# Patient Record
Sex: Male | Born: 1950 | Race: White | Hispanic: No | Marital: Married | Smoking: Former smoker
Health system: Southern US, Community
[De-identification: ages and names within clinical notes are randomized; demographics above are authoritative.]

## PROBLEM LIST (undated history)

## (undated) DIAGNOSIS — T7840XA Allergy, unspecified, initial encounter: Secondary | ICD-10-CM

## (undated) DIAGNOSIS — I10 Essential (primary) hypertension: Secondary | ICD-10-CM

## (undated) DIAGNOSIS — M1990 Unspecified osteoarthritis, unspecified site: Secondary | ICD-10-CM

## (undated) DIAGNOSIS — K219 Gastro-esophageal reflux disease without esophagitis: Secondary | ICD-10-CM

## (undated) DIAGNOSIS — Z951 Presence of aortocoronary bypass graft: Secondary | ICD-10-CM

## (undated) DIAGNOSIS — Z9889 Other specified postprocedural states: Secondary | ICD-10-CM

## (undated) DIAGNOSIS — E785 Hyperlipidemia, unspecified: Secondary | ICD-10-CM

## (undated) DIAGNOSIS — M1612 Unilateral primary osteoarthritis, left hip: Secondary | ICD-10-CM

## (undated) DIAGNOSIS — I2510 Atherosclerotic heart disease of native coronary artery without angina pectoris: Secondary | ICD-10-CM

## (undated) DIAGNOSIS — E039 Hypothyroidism, unspecified: Secondary | ICD-10-CM

## (undated) HISTORY — PX: INGUINAL HERNIA REPAIR: SUR1180

## (undated) HISTORY — PX: BACK SURGERY: SHX140

## (undated) HISTORY — DX: Allergy, unspecified, initial encounter: T78.40XA

## (undated) HISTORY — PX: CARPAL TUNNEL RELEASE: SHX101

## (undated) HISTORY — PX: JOINT REPLACEMENT: SHX530

## (undated) HISTORY — PX: CORONARY ANGIOPLASTY: SHX604

## (undated) HISTORY — DX: Presence of aortocoronary bypass graft: Z95.1

## (undated) HISTORY — PX: LUMBAR DISC SURGERY: SHX700

## (undated) HISTORY — PX: CATARACT EXTRACTION: SUR2

## (undated) HISTORY — PX: TONSILLECTOMY: SUR1361

## (undated) HISTORY — PX: URETEROLITHOTOMY: SHX71

---
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
## 1998-10-22 ENCOUNTER — Inpatient Hospital Stay (HOSPITAL_COMMUNITY): Admission: AD | Admit: 1998-10-22 | Discharge: 1998-10-24 | Payer: Self-pay | Admitting: Cardiology

## 2008-01-23 HISTORY — PX: CORONARY ARTERY BYPASS GRAFT: SHX141

## 2008-01-24 ENCOUNTER — Encounter: Payer: Self-pay | Admitting: Cardiology

## 2008-01-24 ENCOUNTER — Encounter: Payer: Self-pay | Admitting: Thoracic Surgery (Cardiothoracic Vascular Surgery)

## 2008-01-24 ENCOUNTER — Ambulatory Visit: Payer: Self-pay | Admitting: Cardiology

## 2008-01-24 ENCOUNTER — Ambulatory Visit: Payer: Self-pay | Admitting: Thoracic Surgery (Cardiothoracic Vascular Surgery)

## 2008-01-24 ENCOUNTER — Inpatient Hospital Stay (HOSPITAL_COMMUNITY): Admission: AD | Admit: 2008-01-24 | Discharge: 2008-01-31 | Payer: Self-pay | Admitting: Internal Medicine

## 2008-01-25 ENCOUNTER — Encounter: Payer: Self-pay | Admitting: Thoracic Surgery (Cardiothoracic Vascular Surgery)

## 2008-01-27 ENCOUNTER — Encounter: Payer: Self-pay | Admitting: Cardiology

## 2008-01-30 ENCOUNTER — Encounter: Payer: Self-pay | Admitting: Cardiology

## 2008-02-22 ENCOUNTER — Ambulatory Visit: Payer: Self-pay | Admitting: Cardiology

## 2008-02-28 ENCOUNTER — Ambulatory Visit: Payer: Self-pay | Admitting: Thoracic Surgery (Cardiothoracic Vascular Surgery)

## 2008-02-28 ENCOUNTER — Encounter
Admission: RE | Admit: 2008-02-28 | Discharge: 2008-02-28 | Payer: Self-pay | Admitting: Thoracic Surgery (Cardiothoracic Vascular Surgery)

## 2008-04-25 ENCOUNTER — Ambulatory Visit: Payer: Self-pay | Admitting: Cardiology

## 2008-12-01 ENCOUNTER — Ambulatory Visit: Payer: Self-pay | Admitting: Cardiology

## 2009-08-02 DIAGNOSIS — E1169 Type 2 diabetes mellitus with other specified complication: Secondary | ICD-10-CM | POA: Insufficient documentation

## 2009-08-02 DIAGNOSIS — E119 Type 2 diabetes mellitus without complications: Secondary | ICD-10-CM

## 2009-08-02 DIAGNOSIS — E1159 Type 2 diabetes mellitus with other circulatory complications: Secondary | ICD-10-CM | POA: Insufficient documentation

## 2009-08-02 DIAGNOSIS — E785 Hyperlipidemia, unspecified: Secondary | ICD-10-CM

## 2009-08-02 DIAGNOSIS — E782 Mixed hyperlipidemia: Secondary | ICD-10-CM | POA: Insufficient documentation

## 2009-08-02 DIAGNOSIS — I152 Hypertension secondary to endocrine disorders: Secondary | ICD-10-CM | POA: Insufficient documentation

## 2009-08-02 DIAGNOSIS — E039 Hypothyroidism, unspecified: Secondary | ICD-10-CM | POA: Insufficient documentation

## 2009-08-02 DIAGNOSIS — I10 Essential (primary) hypertension: Secondary | ICD-10-CM | POA: Insufficient documentation

## 2009-11-30 ENCOUNTER — Telehealth (INDEPENDENT_AMBULATORY_CARE_PROVIDER_SITE_OTHER): Payer: Self-pay | Admitting: *Deleted

## 2010-01-17 ENCOUNTER — Encounter: Payer: Self-pay | Admitting: Cardiology

## 2010-01-17 DIAGNOSIS — Z951 Presence of aortocoronary bypass graft: Secondary | ICD-10-CM | POA: Insufficient documentation

## 2010-01-17 HISTORY — DX: Presence of aortocoronary bypass graft: Z95.1

## 2010-01-18 ENCOUNTER — Ambulatory Visit: Payer: Self-pay | Admitting: Cardiology

## 2011-01-12 ENCOUNTER — Encounter: Payer: Self-pay | Admitting: Thoracic Surgery (Cardiothoracic Vascular Surgery)

## 2011-01-23 NOTE — Assessment & Plan Note (Signed)
Summary: 1 YR FU -SRS   Visit Type:  Follow-up Referring Provider:  Bennie Dallas, MD  [facility] Primary Provider:  Dr. Wyvonnia Lora   CC:  CAD.  History of Present Illness: the patient is seen for followup of coronary artery disease.  I saw him last December, 2009.  He is doing very well.  He underwent CABG February, 2009.  His symptom at that time was exertional shortness of breath.  This was immediately improved after surgery.  He's had no return of symptoms.  He follows carefully with Dr.Tapper or primary care and Dr.Cantley for diabetes.  I've asked the patient to check to be sure who is overseeing his actual cholesterol levels.  Preventive Screening-Counseling & Management  Alcohol-Tobacco     Smoking Status: quit > 6 months  Comments: Smoked for about 10 years, quit about 30 yrs ago  Current Medications (verified): 1)  Metoprolol Tartrate 25 Mg Tabs (Metoprolol Tartrate) .... Take 1 Tablet By Mouth Twice A Day 2)  Levothyroxine Sodium 200 Mcg Tabs (Levothyroxine Sodium) .... Take 1 Tablet By Mouth Once A Day 3)  Simvastatin 80 Mg Tabs (Simvastatin) .... Take One Tablet By Mouth Daily At Bedtime 4)  Lisinopril 10 Mg Tabs (Lisinopril) .... Take One Tablet By Mouth Daily 5)  Aspirin 81 Mg Tbec (Aspirin) .... Take One Tablet By Mouth Daily 6)  Novolog 100 Unit/ml Soln (Insulin Aspart) .... Sliding Scale As Directed 7)  Lantus 100 Unit/ml Soln (Insulin Glargine) .... Inject 15 Units Subcutaneously Once Daily  Allergies: No Known Drug Allergies  Comments:  Nurse/Medical Assistant: The patient's medications were reviewed with the patient and were updated in the Medication List. Pt brought a list of medications to office visit.  Cyril Loosen, RN, BSN (January 18, 2010 10:32 AM)  Past History:  Past Medical History: Last updated: 01/17/2010 HYPERLIPIDEMIA-MIXED (ICD-272.4) CAD, NATIVE VESSEL (ICD-414.01) CABG... February, 2009.Marland KitchenMarland KitchenLIMA LAD, right radial to posterior  descending, SVG to first diagonal, SVG sequential OM1, OM 2, ON 3 HYPOTHYROIDISM (ICD-244.9) DIABETES MELLITUS (ICD-250.00)...Dr.Cantley.[facility] HYPERTENSION, UNSPECIFIED (ICD-401.9) LV function normal    Social History: Smoking Status:  quit > 6 months  Review of Systems       Patient denies fever, chills, headache, sweats, rash, change in vision, change in hearing, chest pain, cough, shortness of breath, nausea vomiting, urinary symptoms, musculoskeletal pain.  All of the systems are reviewed and are negative.  Vital Signs:  Patient profile:   60 year old male Height:      76 inches Weight:      265 pounds BMI:     32.37 Pulse rate:   60 / minute BP sitting:   120 / 81  (left arm) Cuff size:   large  Vitals Entered By: Cyril Loosen, RN, BSN (January 18, 2010 10:28 AM)  Nutrition Counseling: Patient's BMI is greater than 25 and therefore counseled on weight management options. CC: CAD Comments Follow up. No cardiac complaints. Pt states he saw endocrinologist in December and PA told him she heard an irregular heartbeat. Pt denies palpitations, heart fluttering, etc. He states when he saw endocrinologist he was having trouble with bad indigestion.   Physical Exam  General:  Patient looks quite good today. Head:  head is atraumatic. Eyes:  no xanthelasma. Neck:  no jugular venous distention. Chest Wall:  no chest wall tenderness. Lungs:  lungs are clear.  Respiratory effort is nonlabored. Heart:  cardiac exam reveals S1-S2.  No clicks or significant murmurs. Abdomen:  abdomen  is soft. Msk:  no musculoskeletal deformities. Extremities:  no peripheral edema. Skin:  no skin rashes. Psych:  patient is oriented to person time and place.  Affect is normal.   Impression & Recommendations:  Problem # 1:  HYPOTHYROIDISM (ICD-244.9)  His updated medication list for this problem includes:    Levothyroxine Sodium 200 Mcg Tabs (Levothyroxine sodium) .Marland Kitchen... Take 1  tablet by mouth once a day The patient is followed by [facility] for his thyroid disease.  No change in therapy.  Problem # 2:  HYPERLIPIDEMIA-MIXED (ICD-272.4)  His updated medication list for this problem includes:    Simvastatin 80 Mg Tabs (Simvastatin) .Marland Kitchen... Take one tablet by mouth daily at bedtime  I've asked the patient to ask his other physicians who is overseeing his cholesterol.  He is on simvastatin 80 mg.  Hopefully he has an LDL in the range of 70. with the FDA recommendation, simvastatin should probably be reduced to 40 mg.  I am hesitant to do this without knowing his labs.  I would recommend lowering his simvastatin to 40 and then considering other medicines if he is not at goal.  Problem # 3:  CAD, NATIVE VESSEL (ICD-414.01)  His updated medication list for this problem includes:    Metoprolol Tartrate 25 Mg Tabs (Metoprolol tartrate) .Marland Kitchen... Take 1 tablet by mouth twice a day    Lisinopril 10 Mg Tabs (Lisinopril) .Marland Kitchen... Take one tablet by mouth daily    Aspirin 81 Mg Tbec (Aspirin) .Marland Kitchen... Take one tablet by mouth daily  Orders: EKG w/ Interpretation (93000)  The patient is having no symptoms.  He is fully active.  EKG is done today and reviewed by me.  There is normal sinus rhythm and EKG is normal.  Problem # 4:  HYPERTENSION, UNSPECIFIED (ICD-401.9)  His updated medication list for this problem includes:    Metoprolol Tartrate 25 Mg Tabs (Metoprolol tartrate) .Marland Kitchen... Take 1 tablet by mouth twice a day    Lisinopril 10 Mg Tabs (Lisinopril) .Marland Kitchen... Take one tablet by mouth daily    Aspirin 81 Mg Tbec (Aspirin) .Marland Kitchen... Take one tablet by mouth daily  Blood pressure is under excellent control.  No change in therapy.  Patient Instructions: 1)  Your physician recommends that you continue on your current medications as directed. Please refer to the Current Medication list given to you today. 2)  Your physician wants you to follow-up in: 1YEAR. You will receive a reminder letter in  the mail about two months in advance. If you don't receive a letter, please call our office to schedule the follow-up appointment.

## 2011-01-23 NOTE — Miscellaneous (Signed)
  Clinical Lists Changes  Problems: Added new problem of CORONARY ARTERY BYPASS GRAFT, HX OF (ICD-V45.81) Added new problem of HYPOTHYROIDISM (ICD-244.9) Observations: Added new observation of PAST MED HX: HYPERLIPIDEMIA-MIXED (ICD-272.4) CAD, NATIVE VESSEL (ICD-414.01) CABG... February, 2009.Marland KitchenMarland KitchenLIMA LAD, right radial to posterior descending, SVG to first diagonal, SVG sequential OM1, OM 2, ON 3 HYPOTHYROIDISM (ICD-244.9) DIABETES MELLITUS (ICD-250.00)...Dr.Cantley.[facility] HYPERTENSION, UNSPECIFIED (ICD-401.9) LV function normal   (01/17/2010 16:55)       Past History:  Past Medical History: HYPERLIPIDEMIA-MIXED (ICD-272.4) CAD, NATIVE VESSEL (ICD-414.01) CABG... February, 2009.Marland KitchenMarland KitchenLIMA LAD, right radial to posterior descending, SVG to first diagonal, SVG sequential OM1, OM 2, ON 3 HYPOTHYROIDISM (ICD-244.9) DIABETES MELLITUS (ICD-250.00)...Dr.Cantley.[facility] HYPERTENSION, UNSPECIFIED (ICD-401.9) LV function normal

## 2011-02-24 ENCOUNTER — Ambulatory Visit: Payer: Self-pay | Admitting: Cardiology

## 2011-05-06 NOTE — Assessment & Plan Note (Signed)
[facility]                          [facility] OFFICE NOTE   NAME:Jeffrey Shaw                        MRN:          [id number]  DATE:02/22/2008                            DOB:          [birth year]    Jeffrey Shaw is doing very well.  He is now seen for his first visit after  his bypass surgery.  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  He developed symptoms and I saw him in consultation in the  emergency room at [facility] on January 24, 2008.  He had chest  discomfort with some shortness of breath while walking and then on the  day of admission, he had more and it was prolonged.  He was stabilized  and came to [place], and he was stable and we immediately sent him to  [facility].  At [facility] catheterization showed significant disease and he  underwent CABG by Dr. Andrey Spearman.  He did have a carotid duplex  showing no significant stenoses.  He received excellent operation.  He  had CABG times 6 with a LIMA to the LAD, right radial artery to the  posterior descending, SVG to the first diagonal, sequential SVG to the  OM1 2, and 3.  He had endoscopic harvest of the vein of his right leg.   Since being discharged, he is stable.  He is already walking in his gym.  He is active and doing very well.  He has had no chest pain.  He is  quite motivated.   PAST MEDICAL HISTORY:   ALLERGIES:  No known drug allergies.   MEDICATIONS:  Oxycodone as needed.  Isosorbide (to run out in 1 month),  Lantus 15 units in the evening, NovoLog sliding scale with meals,  aspirin 81, metoprolol 25 thyroid 0.2 mg daily, and next simvastatin 80  mg daily.   OTHER MEDICAL PROBLEMS:  See the list below.   REVIEW OF SYSTEMS:  He is feeling well and he is nicely recovering.  Review of systems otherwise is negative.   PHYSICAL EXAM:  Blood pressure is 105/64.  Weight is 245 pounds.  The patient is oriented to person, time and place.  Affect is  normal.  HEENT:  Reveals no xanthelasma.  He has normal extraocular motion.  There are no carotid bruits.  There is no jugular venous distention.  Lungs are clear.  Respiratory effort is not labored.  Cardiac exam reveals S1-S2.  There are no clicks or significant murmurs.  His chest lesions are nicely healed.  Abdomen is soft.  He has normal bowel sounds.  No significant peripheral edema.   EKG reveals sinus rhythm.   PROBLEMS:  1. Diabetes treated.  2. History of hypertension treated.  3. Hypothyroidism, treated.  4. Coronary artery disease status post CABG as outlined on January 27, 2008 with CABG times 6 using a LIMA to the LAD and a right radial      artery to the posterior descending and other vein grafts.  5. Dyslipidemia, treated.  6.  Normal LV function with ejection fraction estimated 60% in the cath      lab.   Jeffrey Shaw is doing well.  I will let his Imdur run out.  I will see him  back in 2 months.  He will see Dr. Dorris Fetch next week as his visit  this week had to be cancelled because of snow.     Jeffrey Abed, MD, [facility]  Electronically Signed    JDK/MedQ  DD: 02/22/2008  DT: 02/22/2008  Job #: [id number]   cc:   Wyvonnia Lora  Dr. Bennie Dallas

## 2011-05-06 NOTE — Assessment & Plan Note (Signed)
OFFICE VISIT   GEREMIAH, FUSSELL  DOB:  [birth year]                                        February 28, 2008  CHART #:  [id number]   The patient is a 60 year old gentleman who presented with new onset  angina.  He underwent coronary artery bypass grafting x6 on February 5.  We did left mammary and right radial as well as saphenous veins.  He is  diabetic.  Postoperatively, he really did quite well and was discharged  home on postoperative day #4 without any significant complications.   The patient returns today for a scheduled follow-up visit.  His chief  complaint is that he has numbness in the fourth and fifth fingers on  both hands which has been aggravating to him.  He still has some  discomfort in the sternal incision.  He is only taking his pain  medication at night because he has difficulty sleeping due to the  discomfort.  He is not having to take pain medication during the day.  He works as a Systems analyst and he tried to go back to work for a  full day and he was exhausted for about two days afterward.  However, he  is walking at least two miles everyday.   PHYSICAL EXAMINATION:  GENERAL:  The patient is a 60 year old white male  in no acute distress.  VITAL SIGNS:  Blood pressure 148/82, pulse 64, respirations 18, oxygen  saturations 95% on room air.  NEUROLOGY:  He is alert and oriented x3, appropriate, and grossly  intact.  He does have diminished sensation, but intact motor function in  the fourth and fifth fingers on both hands.  CHEST:  His sternal incision is clean, dry, and intact.  Sternum is  stable.  HEART:  Regular rate and rhythm with normal S1 and S2.  LUNGS:  Clear with equal breath sounds.  Radial artery incision is  healing well.  His legs have no peripheral edema and his incisions are  clean dry.   Chest x-ray shows good aeration of the lungs.  There are no effusions or  infiltrates.   IMPRESSION:  The patient is doing  extremely well at this point in time.  His exercise tolerance is excellent.  He is being much too hard on  himself regarding his recovery.  I think he is ahead of most people in  terms of his exercise tolerance, he is just not quite to his standards  as he was preoperatively, but he was very physically active Patent examiner.  I encouraged him to wait a couple more weeks before trying to  go back to work and then to start maybe two hours a day for a week and  then he can increase by an hour or two each week until he is back up to  full speed within a month or two after that.  I did caution him that  this will not be necessarily a straight line.  In terms of his recovery,  he will have some days that are better than others, but overall he  should start to see some significant changes over the next 3-4 weeks in  terms of his energy level, comfort, and getting back to normal and  having more stamina.  He may begin driving a car.  Appropriate  precautions were  discussed.  He is not to lift any objects that weigh  greater than 10 pounds for at least two weeks and after that should very  gradually build up.  I did give him a prescription for Oxycodone 5 mg  tablets one to two three times daily as needed for pain 30 tablets.  He  is mostly just using those at night.  He will continue to be followed by  Dr. Margo Common and Dr. Myrtis Ser.  I would be happy to see him back any time if I  can be of any further assistance with his care.   Salvatore Decent Dorris Fetch, M.D.  Electronically Signed   SCH/MEDQ  D:  02/28/2008  T:  02/29/2008  Job:  [id number]   cc:   Luis Abed, MD, [facility]  Wyvonnia Lora

## 2011-05-06 NOTE — Assessment & Plan Note (Signed)
[facility]                          [facility] OFFICE NOTE   NAME:Ayars, WILSON DUSENBERY                        MRN:          [id number]  DATE:12/01/2008                            DOB:          [birth year]    Mr. Popwell is doing very well.  He underwent CABG in January 27, 2008.  He is very motivated.  He owns and runs a gym.  He exercises regularly.  He follows with Dr. Margo Common and his endocrinologist, Dr. Nonie Hoyer at  [facility].  He has had followup labs.  He tells me Dr. Margo Common has said  that his lipids are okay.  His hemoglobin A1c is below 6.  He is not  having any shortness of breath.  He has some vague chest discomfort that  is probably related to some climbing that he did recently to change a  light bulb.  It does not sound like ischemia.   PAST MEDICAL HISTORY:   ALLERGIES:  No known drug allergies.   MEDICATIONS:  Lantus, NovoLog, aspirin, levothyroxine, simvastatin,  lisinopril, and metoprolol.   OTHER MEDICAL PROBLEMS:  See the list on my note of Apr 25, 2008.   REVIEW OF SYSTEMS:  He has no GI or GU symptoms.  He did have a guaiac-  positive stool and had a colonoscopy and he was told it was okay.  He  has no headaches, fevers, chills, or skin rashes.  Otherwise, his review  of systems is negative.   PHYSICAL EXAMINATION:  GENERAL:  Blood pressure needs to be rechecked  and this will be done and recorded.  He tells me it has been okay at all  his other sites recently.  HEENT:  No xanthelasma.  There is normal extraocular motion.  There are  no carotid bruits.  There is no jugular venous distention.  LUNGS:  Clear.  Respiratory effort is not labored.  CARDIAC:  S1 with an S2.  There are no clicks or significant murmurs.  ABDOMEN:  Soft.  EXTREMITIES:  There is no peripheral edema.   No labs were done.   The problems are listed on the note of Apr 25, 2008.  #4.  Coronary disease.  He is doing quite well.  #5.  Dyslipidemia.  Dr. Margo Common  has his labs.  We want his LDL in 70  range.  If he has an HDL abnormality at this point, consideration should  be given to adding Niaspan to his medicines for the most aggressive care  that we can offer.   He is stable.  I will see him back in 6 months.     Luis Abed, MD, [facility]  Electronically Signed    JDK/MedQ  DD: 12/01/2008  DT: 12/01/2008  Job #: [id number]   cc:   Wyvonnia Lora

## 2011-05-06 NOTE — Op Note (Signed)
[name], Jeffrey Shaw NO.:  [id number]   MEDICAL RECORD NO.:  [id number]          PATIENT TYPE:  INP   LOCATION:  2307                         FACILITY:  [facility]   PHYSICIAN:  Jeffrey Shaw. Dorris Fetch, M.D.DATE OF BIRTH:  05-31-1951   DATE OF PROCEDURE:  01/27/2008  DATE OF DISCHARGE:                               OPERATIVE REPORT   PREOPERATIVE DIAGNOSIS:  Three vessel disease with new onset angina.   POSTOPERATIVE DIAGNOSIS:  Three vessel disease with new onset angina.   PROCEDURE:  Median sternotomy, extracorporeal circulation, coronary  bypass grafting x6 (left internal mammary artery to the left anterior  descending, right radial artery to the posterior descending, saphenous  vein graft to first diagonal, sequential saphenous vein graft to obtuse  marginals 1, 2, and 3), endoscopic vein harvest, right leg.   SURGEON:  Jeffrey Shaw. Dorris Fetch, M.D.   ASSISTANT:  Jeffrey Shaw, P.A.-C.   ANESTHESIA:  General.   FINDINGS:  Good quality targets, good quality conduits, normal Allen's  test right radial.   CLINICAL NOTE:  Jeffrey Shaw is a 60 year old gentleman who presented with  new onset exertional chest pain. At catheterization, he had severe three  vessel coronary disease and was referred for coronary bypass grafting.  The indications, risks, benefits and alternatives were discussed in  detail with the patient who understood, accepted the risks, and agreed  to proceed.  He was diabetic and not a candidate for bilateral mammary  arteries.  The potential for using a right radial artery was discussed  with the patient, as he is left handed.  He understood the additional  issues involved with radial artery harvest and potential complications.  He accepted those and agreed to proceed.   OPERATIVE NOTE:  Jeffrey Shaw was brought to the preop holding area on  January 27, 2008.  There, lines were placed by the anesthesia service to  monitor arterial, central  venous, and pulmonary arterial pressure.  Intravenous antibiotics were administered. Confirmation of the normal  Allen's test with pulse oximetry was performed.  The patient was taken  to the operating room, anesthetized, and intubated.  A Foley catheter  was placed.  The chest, abdomen, legs and right arm were prepped and  draped in the usual fashion.   An incision was made in the volar aspect of the right wrist overlying  the radial artery.  The distal incision was performed initially and a  short segment of the radial artery was mobilized. There was an excellent  pulse with proximal occlusion.  The incision was extended to just below  the antecubital fossa and the radial artery was harvested using standard  technique using a harmonic scalpel.  It was a large caliber good-quality  conduit five thousand units of heparin was administered during the  vessel harvest.  Simultaneously, an incision was made in the medial  aspect of the right leg at the level of the knee and the greater  saphenous vein was harvested from mid calf to groin endoscopically.  The  greater saphenous vein, likewise, was a good quality vessel.   After  completing the radial artery harvest and closure of the incision,  the arm was tucked to the patient's side after being sterilely dressed.  A median sternotomy then was performed and the left internal mammary  artery was harvested using standard technique. The left internal mammary  artery, likewise, was an excellent quality conduit.  The remainder of  the full heparin dose was given prior to opening the pericardium.   The pericardium was opened.  The ascending aorta was inspected with no  evidence of atherosclerotic disease.  The aorta was cannulated via  concentric 2-0 Ethibond pledgeted pursestring sutures.  A dual stage  venous cannula was placed via pursestring suture in the right atrial  appendage.  Cardiopulmonary bypass was instituted and the patient was   cooled to 32 degrees Celsius.  The coronary arteries were inspected and  anastomotic sites were chosen.  The conduits were inspected and cut to  length. A foam pad was placed in the pericardium to the left phrenic  nerve.  A temperature probe was placed in myocardial septum.  A  cardioplegic cannula was placed in the ascending aorta.   The aorta was crossclamped, the left ventricle was emptied via aortic  root vent.  Cardiac arrest was achieved with a combination cold  antegrade blood cardioplegia and topical iced saline.  1 liter of  cardioplegia was administered.  The myocardial septal temperature was 11  degrees Celsius.  The following distal anastomoses were performed.   First, a reversed saphenous vein graft was placed end-to-side to the  first diagonal branch of the LAD.  This was a 1.5 mm good quality  target.  The vein graft was anastomosed end-to-side with a running 7-0  Prolene suture.  At the completion of each anastomosis, it was probed  proximally and distally to ensure patency before tying the suture at the  completion of each vein graft and cardioplegia was administered to  inspect for both flow and hemostasis.   Next, a reversed saphenous vein graft was placed sequentially to obtuse  marginals 1, 2, and 3.  Obtuse marginals 1 and 2 were 2 mm good quality  vessels, obtuse marginal 3 was a 1.3 mm fair quality vessel.  Side-to-  side anastomoses were performed to obtuse marginals 1 and 2 and then end-  to-side to obtuse marginal 3.   Next, the distal end of the radial artery was beveled and was  anastomosed end-to-side to the posterior descending branch of the right  coronary.  This was a 2 mm target. The radial was a 2.5 mm conduit.  The  anastomosis was performed with a running 8-0 Prolene suture.  The  anastomosis probed easily.  Cardioplegia was administered down the vein  grafts and the aortic root.  There was good back bleeding from the  radial artery.   Next, the  left internal mammary artery was brought through a window in  the pericardium.  The distal end was beveled and was anastomosed end-to-  side to the LAD.  The LAD was a 2 mm good quality target at the site of  anastomosis.  The mammary was a 2 mm good quality conduit.  The  anastomosis was performed with a running 8-0 Prolene suture.  At the  completion of the mammary to LAD anastomosis, the bulldog clamp was  briefly removed and inspected for hemostasis.  Immediate and rapid  septal rewarming was noted.  The bulldog clamp was replaced and the  mammary pedicle was tacked to the epicardial  surface of the heart with 6-  0 Prolene sutures.   Additional cardioplegia was administered.  The radial and vein grafts  were cut to length and the proximal anastomoses were performed to 4.5 mm  punch aortotomies with running 6-0 Prolene sutures for the vein grafts  and a running 7-0 Prolene suture for the radial artery graft. At the  completion of the final vein graft anastomoses, the patient was placed  in Trendelenburg position and de-airing was performed.  Lidocaine was  administered.  The bulldog clamp was again removed from the left mammary  artery and the aortic crossclamp was removed.  The total crossclamp time  was 78 minutes.  The patient required a single defibrillation with 20  joules and then remained in sinus rhythm, thereafter.   While the patient was being rewarmed, all proximal and distal  anastomoses were inspected for hemostasis.  Epicardial pacing wires were  placed on the right ventricle and right atrium.  When the patient had  rewarmed to a core temperature of 37 degrees Celsius, he was weaned from  cardiopulmonary bypass on the first attempt with no inotropic support.  The patient initially had mild elevation of his ST-segment in the  inferior leads.  This was about 1.5 mm.  I was unclear whether possibly  some air had entered the radial artery. The radial artery was treated   with topical papaverine. The ST segment elevation resolved, so there may  have been some component of spasm, although it was not visible. The  patient then remained hemodynamically stable throughout the post bypass  period.  A test dose of protamine was administered and was well  tolerated. The atrial and aortic cannulae were removed.  The remainder  of the protamine was administered without incident.  The chest was  irrigated with 1 liter of normal saline containing 1 gram of vancomycin.  Hemostasis was achieved.  The left pleural and two mediastinal chest  tubes were placed through separate subcostal incisions. The pericardium  was reapproximated with interrupted 3-0 silk sutures.  It came together  easily without tension.  The sternum was closed with heavy gauge  stainless steel wires.  The pectoralis fascia, subcutaneous tissue and  skin were closed in standard fashion.  All sponge, needle and instrument  counts were correct at the end of the procedure.  The patient was taken  from the operating room to the surgical intensive care unit in good  condition.      Jeffrey Shaw Dorris Fetch, M.D.  Electronically Signed     SCH/MEDQ  D:  01/27/2008  T:  01/28/2008  Job:  [id number]   cc:   Luis Abed, MD, [facility]  Wyvonnia Lora

## 2011-05-06 NOTE — Cardiovascular Report (Signed)
Jeffrey Shaw, Jeffrey Shaw                 ACCOUNT NO.:  [id number]   MEDICAL RECORD NO.:  [id number]          PATIENT TYPE:  INP   LOCATION:  4729                         FACILITY:  [facility]   PHYSICIAN:  Veverly Fells. Excell Seltzer, MD  DATE OF BIRTH:  1951-06-01   DATE OF PROCEDURE:  01/24/2008  DATE OF DISCHARGE:                            CARDIAC CATHETERIZATION   PROCEDURE:  Left heart catheterization, selective coronary angiography,  left ventricular angiography, left subclavian angiography.   INDICATIONS:  Jeffrey Shaw is a 60 year old gentleman with longstanding  diabetes and remote tobacco use.  He is an active gentleman who has been  experiencing crescendo exertional angina over the last few weeks, and  earlier today he developed resting chest pain.  In the setting of his  classic symptoms, he was referred for cardiac catheterization.  His  initial EKG and cardiac biomarkers have been normal.   Risks and indications of the procedure were reviewed with the patient.  Informed consent was obtained.  The right groin was prepped, draped,  anesthetized with 1% lidocaine.  Using the modified Seldinger technique,  a 6-French sheath was placed in the right femoral artery.  Standard 6-  French Judkins catheters were used for coronary angiography.  The JR-4  catheter was used for left subclavian angiography.  An angled pigtail  catheter was used for left ventriculography.  The patient tolerated the  procedure well and had no immediate complications.  All catheter  exchanges were performed over a guidewire.   FINDINGS:  Aortic pressure 100/61 with a mean of 78, left ventricular  pressure is 99/16.   CORONARY ANGIOGRAPHY:  The left mainstem has mild calcification.  The  left main bifurcates into the LAD and left circumflex.  The left  mainstem has mild distal stenosis of 30-40%.   LAD:  The LAD is mildly calcified.  The LAD is a large-caliber vessel  that supplies a large first diagonal branch and a  smaller second  diagonal branch.  The ostium of the LAD has a 50% stenosis.  The LAD at  the first septal perforator has a 70% stenosis.  The LAD at the second  diagonal has an 80% stenosis.  The first diagonal is very large vessel.  It has a long segment of 70-80% stenosis in its proximal portion.  The  remaining portions of the mid and distal LAD have no significant  angiographic stenosis.   Left circumflex:  The left circumflex has a 70% ostial stenosis.  The  circumflex then supplies a large first OM branch.  The first OM branch  has a 50% proximal stenosis.  The second OM has mild stenosis in the  range of 30%.  It is also a large vessel.  The AV groove circumflex  courses down and has diffuse disease in the range of 30-40% supplying a  left posterolateral branch.   The right coronary artery is occluded in its proximal portion.  There is  a well-formed collateral from the apex of the LAD that supplies a large  PDA branch and retrograde fills the posterolateral branches.   Left subclavian  artery is widely patent.  The LIMA is a large vessel  that is widely patent.  There is no pressure gradient into the left  subclavian origin.   Left ventricular function assessed by ventriculography is normal.  The  LVEF is estimated at 60%.   ASSESSMENT:  1. Severe three-vessel coronary artery disease as detailed above.  2. Normal left ventricular systolic function.  3. Patent left subclavian and left internal mammary artery.   RECOMMENDATIONS:  Jeffrey Shaw has longstanding insulin-dependent diabetes  and multivessel CAD.  He will obtain long term benefit from coronary  bypass surgery.  A CVTS consult will be placed.  He will continue on  aspirin, medical therapy for CAD, and IV heparin will be resumed  tomorrow morning.      Veverly Fells. Excell Seltzer, MD  Electronically Signed     MDC/MEDQ  D:  01/24/2008  T:  01/25/2008  Job:  [id number]

## 2011-05-06 NOTE — Letter (Signed)
February 28, 2008   Luis Abed, MD, [facility]  [address]  [address]  [place] [address]   Re:  HOLDAN, STUCKE                 DOB:  1951/02/02   Dear Trey Paula:   I saw Aarik Blank back in the office today.  As you know Mr. Debois is a  very nice 60 year old gentleman from [place].  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  He had coronary artery bypass grafting  x6 including a left radial on February 5.  Mr. Stamey is doing very well  at this point in time.  His only abnormal complaint is some numbness in  the fourth and fifth fingers on both hands consistent with some brachial  plexopathy.  He is a big man and likely has some stretch on his brachial  plexus with sternal retraction.  I reassured him that should get better  over time but may take a couple of months to resolve.  Other than that,  his exercise tolerance is good.  His discomfort is mild.  He is only  having to take pain medication at night and he is very motivated in  terms of being physically active and keeping control of his diabetes.  It has been a pleasure to be involved with Mr. Vandeberg' care.  Please do  not ever hesitate to contact me if I can be of any further assistance in  the future.   Salvatore Decent Dorris Fetch, M.D.  Electronically Signed   SCH/MEDQ  D:  02/28/2008  T:  02/29/2008  Job:  [id number]   cc:   Wyvonnia Lora

## 2011-05-06 NOTE — Assessment & Plan Note (Signed)
[facility]                          [facility] OFFICE NOTE   NAME:Jeffrey Shaw, Jeffrey Shaw                        MRN:          [id number]  DATE:04/25/2008                            DOB:          1951-08-25    Jeffrey Shaw is doing very well.  He is very motivated.  He is seen back  after his bypass surgery.  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  He walked and ran 3 miles today.  We have given him  permission to slowly begin to lift some very light weights.  I had seen  him at [facility] in the ER on January 24, 2008, and ultimately he needed  CABG.  This was done by Dr. Andrey Spearman, and he has now been  released.  He received CABG x6 with a LIMA to the LAD, right radial to  the posterior descending, SVG to the first diagonal, and sequential SVG  to OM-1, 2 and 3.   PAST MEDICAL HISTORY:   ALLERGIES:  NO KNOWN DRUG ALLERGIES.   MEDICATIONS:  Lantus, NovoLog, aspirin, metoprolol thyroid, simvastatin  and lisinopril.   OTHER MEDICAL PROBLEMS:  See the list below.   REVIEW OF SYSTEMS:  He is doing extremely well.  He mentions that he has  a slight sensation of a movement of a bone in his chest.  This is in the  anterior upper right ribs.  On physical exam, I find no finding.  He is  stable.  Otherwise, his review of systems is negative.   PHYSICAL EXAMINATION:  VITAL SIGNS:  Blood pressure is 130/70 with a  pulse of 55.  Weight is 248 pounds.  GENERAL:  The patient is oriented to person, time and place.  Affect is  normal.  HEENT:  Reveals no xanthelasma.  He has normal extraocular motion.  NECK:  There are no carotid bruits.  There is no jugulovenous  distention.  LUNGS:  Clear.  Respiratory effort is not labored.  CARDIAC:  Reveals S1-S2.  There are no clicks or significant murmurs.  ABDOMEN:  Soft.  EXTREMITIES:  He has no peripheral edema.   No labs were done today.   PROBLEMS:  1. Diabetes, treated.  He is to see  Dr. Nonie Hoyer very soon.  2. Hypertension, treated.  3. Hypothyroidism, treated.  4. Coronary disease, post CABG on January 27, 2008, as outlined      above.  5. Dyslipidemia, treated.  Of course, we will want his LDL to be in      the range of 70.  6. Normal LV function.   Jeffrey Shaw is very motivated.  I will see him back in 6 months.     Jeffrey Abed, MD, [facility]  Electronically Signed    JDK/MedQ  DD: 04/25/2008  DT: 04/25/2008  Job #: [id number]   cc:   Orene Desanctis, MD

## 2011-05-06 NOTE — Consult Note (Signed)
Jeffrey Shaw, Jeffrey Shaw                 ACCOUNT NO.:  [id number]   MEDICAL RECORD NO.:  [id number]          PATIENT TYPE:  INP   LOCATION:  4729                         FACILITY:  [facility]   PHYSICIAN:  Salvatore Decent. Dorris Fetch, M.D.DATE OF BIRTH:  1951/02/01   DATE OF CONSULTATION:  01/24/2008  DATE OF DISCHARGE:                                 CONSULTATION   REASON FOR CONSULTATION:  Three-vessel coronary disease.   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  He is quite active.  He  does have a history of insulin-dependent diabetes, dyslipidemia and  hypertension, but no previous history of coronary disease.  He states  over the past 6 months, he has noted some decrease in his exercise  tolerance and some occasional shortness of breath with exertion.  More  recently, he started to notice chest discomfort, usually it is at the  end of exertion.  He saw Dr. Margo Common on Friday, they discussed and felt  that he needed to see a cardiologist, but would treat him with proton  pump inhibitors over the weekend.  He was started on Nexium, but that  made no difference for him.  Yesterday, he was walking his dogs and  after about a mile, he noticed chest discomfort and shortness of breath.  It took him several minutes to recover.  This morning he went to work at  the gym and at the end of about a 30-minute cardio workout, he developed  chest discomfort and shortness of breath, and that brought him to the  emergency room.  He was treated with nitroglycerin and morphine, and his  pain resolved.  He was seen in consultation by Dr. Willa Rough and sent  to [facility] for cardiac catheterization.  At catheterization, he was  found to have severe three-vessel disease.  He had total occlusion of  his right coronary.  His posterior descending filled via left-to-right  collaterals.  He had 70-80% stenoses in his LAD, 12-50% ostial stenosis.  He had 70% first diagonal, 70% ostial circumflex stenosis, 50% OM1 as  well as some moderate disease and OMs 2 and 3.  His left ventricular  function was normal.  He currently is pain free.   PAST MEDICAL HISTORY:  1. Significant for insulin-dependent diabetes.  2. Hypothyroidism.  3. Hypertension.  4. Dyslipidemia.   MEDICATIONS ON ADMISSION:  1. Lantus 15 units subcu. nightly.  2. NovoLog insulin with meals.  3. Simvastatin 20 mg nightly.  4. Aspirin 81 mg daily.  5. Nexium 20 mg daily.  6. Synthroid, dose currently unknown.   ALLERGIES:  NO KNOWN DRUG ALLERGIES.   FAMILY HISTORY:  Negative for coronary disease.   SOCIAL HISTORY:  He smoked in the past, but has not smoked in over 30  years.  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  He works out daily.   REVIEW OF SYSTEMS:  Chest discomfort and shortness of breath as noted.  Indigestion for the past 6 months. Decrease in exercise tolerance,  fatiguing more  easily over the past 6 months.  No history of excessive  bleeding or bruising.  No stroke, TIA symptoms.  No history of  paroxysmal nocturnal dyspnea, orthopnea or peripheral edema.  All other  systems are negative.   PHYSICAL EXAMINATION:  GENERAL:  Jeffrey Shaw is a well-appearing 56-year-  old white male in no acute distress.  He is 253 pounds.  NEUROLOGICAL:  He is alert, oriented x3, appropriate and grossly intact.  HEENT:  Unremarkable.  NECK:  Supple without thyromegaly, adenopathy or bruits.  CARDIAC:  Regular rate and rhythm.  Normal S1-S2.  No murmurs, rubs or  gallops.  LUNGS:  Clear with equal breath sounds bilaterally.  ABDOMEN:  Soft, nontender.  EXTREMITIES:  Without clubbing, cyanosis or edema.  He has an equivocal  Allen's test on the right.  He is left-handed.  He has 2+ dorsalis pedis  and posterior tibial pulses bilaterally.  There is no peripheral edema.  SKIN:  Warm and dry.   LABORATORY DATA:  Glucose 155, BUN and creatinine are 17 and 1.3, sodium  139,  potassium 4.0, CK 161, MB 4.6, troponin 0.01, PT 12.6, INR 0.9, PTT  25, white count 6.4, hematocrit 41, platelets 256.   DIAGNOSTICS:  EKG shows sinus rhythm, no acute ST changes.   IMPRESSION:  Jeffrey Shaw is a 60 year old gentleman with multiple cardiac  risk factors who presents with new exertional chest pain with recent  exacerbation with 2 significant episodes both yesterday and today.  At  catheterization, he has severe three-vessel coronary disease with  preserved left ventricular function.  Coronary artery bypass grafting is  indicated for survival benefit as well as relief of symptoms.  I have  discussed in detail with the patient the indications, risks, benefits  and alternative treatments.  He understands that the risks include, but  are not limited to death, stroke, MI, DVT, PE, bleeding, possible need  for transfusion, infections as well as other organ system dysfunction  including respiratory, renal or GI complications.  He understands and  accepts these risks, and agrees to proceed.  As it currently stands,  first available operating slot is Thursday morning, January 27, 2008.  All of the patient's questions were answered.  If earlier operating room  availability presents itself, we would move him up as indicated.      Salvatore Decent Dorris Fetch, M.D.  Electronically Signed     SCH/MEDQ  D:  01/24/2008  T:  01/25/2008  Job:  [id number]   cc:   Luis Abed, MD, [facility]  Wyvonnia Lora

## 2011-05-06 NOTE — Discharge Summary (Signed)
NAMEADAL, SERENO NO.:  [id number]   MEDICAL RECORD NO.:  [id number]          PATIENT TYPE:  INP   LOCATION:  2017                         FACILITY:  [facility]   PHYSICIAN:  Salvatore Decent. 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   SECONDARY DIAGNOSES:  1. Acute blood loss anemia.  2. Volume overload postoperatively.  3. Insulin-dependent diabetes mellitus.  4. Hypothyroidism.  5. Hypertension.  6. Dyslipidemia.   IN HOSPITAL OPERATIONS AND PROCEDURES:  1. Cardiac catheterization.  2. Coronary artery bypass grafting x6 using a left internal mammary      artery to the left anterior descending, right radial artery to      posterior descending, saphenous vein graft to first diagonal,      sequential saphenous vein graft to obtuse marginals 1, 2, and 3.      Endoscopic vein harvest from the right leg with open radial harvest      from the right.   HISTORY OF PRESENT ILLNESS AND HOSPITAL COURSE:  Mr. Fendley is a 60-year-  old gentleman who presented with new onset exertional chest pain.  At  catheterization, he had severe three-vessel coronary artery disease and  was referred for coronary artery bypass grafting.  The patient was seen  and evaluated by Dr. Dorris Fetch.  Dr. Dorris Fetch discussed undergoing  coronary bypass grafting with the patient.  He discussed the risks and  benefits with the patient.  The patient acknowledged understanding and  agreed to proceed.  Surgery was scheduled for January 27, 2008.   Preoperatively, the patient did have bilateral carotid duplex ultrasound  done showing no significant ICA stenosis.  He also had bilateral ABIs  done showing to be greater than 1 bilaterally.  The patient remained  stable preoperatively.  For details of the  patient's past medical  history and physical exam, please see dictated H&P.   The patient was taken to the operating room on January 27, 2008 where he  underwent coronary artery bypass grafting x6 using a left internal  mammary artery to left anterior descending, right radial artery to  posterior descending, saphenous vein graft to first diagonal, sequential  saphenous vein graft to obtuse marginals 1,2, and 3.  Endoscopic vein  harvesting from the right leg and open radial harvest from the right  arm.  The patient tolerated this procedure and was transferred to the  intensive care unit in stable condition.  He was noted to be  hemodynamically stable postoperatively.  He was extubated the evening of  surgery.  Post extubation, the patient was noted to be alert and  oriented x4.  Post extubation, the patient was placed on nasal cannula.  He was sating greater than 90%.  Chest x-ray done postop day #1 was  clear.  He had minimal drainage from chest tubes, and chest tube was  discontinued in normal fashion.  Followup chest x-ray day #2 was  stable.  The patient was using his incentive spirometer postoperatively.  He was  able to be weaned off oxygen, sating greater than 90% on room air.  Postoperatively, the patient was placed on AAI pacing at 90.  He was  able to be weaned from all drips, with heart rate and blood pressure  remaining stable.  The external pacer was able to be turned off, and the  patient was maintaining heart rate greater than 60.  Postoperatively,  the patient was able to be started on low-dose Lopressor.  Heart rate  and blood pressure tolerated it well.  The patient remained in normal  sinus rhythm during his postoperative course.  External pacing wires  were discontinued in normal fashion.  The patient tolerated it well.  Postoperatively, the patient did have slight acute blood loss anemia.  He did not require any transfusions postoperatively.  He remained   asymptomatic, and hemoglobin/hematocrit were followed.  He did remain  stable during his postoperative course.  The patient also had some  volume overload postoperatively.  He was started on diuretics.  Daily  weights were obtained, and the patient was back near baseline weight  prior to discharge home.  Postoperatively, the patient's blood sugars  were followed closely.  He is an insulin-dependent diabetic.  The  patient was started back on Lantus insulin postoperatively.  Blood  sugars were monitored and noted to be elevated.  The patient's Lantus  insulin was increased, and he was also started back on NovoLog with  meals.  Unfortunately, blood sugars remained elevated and Lantus insulin  increased further.  Sugars were improving slowly.  The patient will need  to monitor blood sugars at home post discharge.   On postop day #4, the patient's vital signs were noted to be stable.  He  was afebrile.  O2 saturations were greater than 90% on room air.  Blood  sugars were elevated but improving.  He was 1.3 kg above his  preoperative weight.  Labs showed a white count 8.7, hemoglobin 10.8,  hematocrit 30.6, platelet count of 168.  BMP day prior showed a sodium  of 136, potassium 4.1, chloride of 103, bicarb of 25, BUN of 20,  creatinine 1.24, glucose of 149.  The patient was in normal sinus  rhythm.  Pulmonary status was stable.  Incisions were clean, dry, and  intact and healing well.  The patient had good motor and sensation in  his right hand.  The patient was felt stable and ready for discharge  home today, postop day 4, January 31, 2008.   FOLLOWUP APPOINTMENTS:  Followup appointment will be arranged with Dr.  Dorris Fetch for in 3 weeks.  Our office will contact the patient with  this information.  The patient will need to obtain PA and lateral chest  x-ray 30 minutes prior to this appointment.  An appointment has been  arranged for the patient to follow up with Dr. Myrtis Ser on February 22, 2008 at  9:00 a.m.   ACTIVITY:  The patient instructed on no driving until released to do so,  no heavy lifting over 10 pounds.  He was told to ambulate 3-4 times per  day, progress as tolerated, and to continue his breathing exercises.   INCISIONAL CARE:  The patient was told he is allowed to shower, washing  his incisions using soap and water.  He is to contact the office if he  develops any drainage or opening from any of his incision sites.  DIET:  The patient educated on diet to be low-fat, low-salt.   DISCHARGE MEDICATIONS:  1. Aspirin 325 mg daily.  2. Lopressor 25 mg b.i.d.  3. Nexium 20 mg daily.  4. Synthroid 200 mcg daily.  5. Imdur 30 mg daily x1 month.  6. Lasix 40 mg daily x7 days.  7. Potassium chloride 20 mEq daily x7 days.  8. Lantus insulin 40 units subcu at night.  9. NovoLog 4 units with meals.  10.Oxycodone 5 mg 1-2 tablets q.4-6h. p.r.n.  11.Simvastatin 80 mg at night.      Theda Belfast, PA      Salvatore Decent. Dorris Fetch, M.D.  Electronically Signed    KMD/MEDQ  D:  01/31/2008  T:  02/01/2008  Job:  [id number]   cc:   Salvatore Decent. Dorris Fetch, M.D.  Luis Abed, MD, [facility]

## 2011-09-12 LAB — PROTIME-INR
INR: 1.1
INR: 1.2
Prothrombin Time: 14.5

## 2011-09-12 LAB — POCT I-STAT 3, ART BLOOD GAS (G3+)
Acid-base deficit: 1
Acid-base deficit: 3 — ABNORMAL HIGH
Acid-base deficit: 3 — ABNORMAL HIGH
O2 Saturation: 93
O2 Saturation: 99
Operator id: 193041
Operator id: 3291
TCO2: 25
pCO2 arterial: 40.1
pCO2 arterial: 40.1
pO2, Arterial: 134 — ABNORMAL HIGH
pO2, Arterial: 298 — ABNORMAL HIGH

## 2011-09-12 LAB — HEMOGLOBIN AND HEMATOCRIT, BLOOD
HCT: 28.1 — ABNORMAL LOW
Hemoglobin: 9.8 — ABNORMAL LOW

## 2011-09-12 LAB — POCT I-STAT 4, (NA,K, GLUC, HGB,HCT)
Glucose, Bld: 146 — ABNORMAL HIGH
Glucose, Bld: 170 — ABNORMAL HIGH
Glucose, Bld: 210 — ABNORMAL HIGH
HCT: 24 — ABNORMAL LOW
HCT: 25 — ABNORMAL LOW
HCT: 31 — ABNORMAL LOW
HCT: 33 — ABNORMAL LOW
Hemoglobin: 10.5 — ABNORMAL LOW
Hemoglobin: 8.2 — ABNORMAL LOW
Hemoglobin: 8.8 — ABNORMAL LOW
Hemoglobin: 8.8 — ABNORMAL LOW
Operator id: 3291
Operator id: 3291
Operator id: 3291
Potassium: 3.9
Potassium: 4
Potassium: 4.3
Potassium: 4.6
Sodium: 135
Sodium: 136
Sodium: 136

## 2011-09-12 LAB — COMPREHENSIVE METABOLIC PANEL
ALT: 23
AST: 18
Calcium: 8.6
GFR calc Af Amer: >60
Sodium: 133 — ABNORMAL LOW
Total Protein: 6.2

## 2011-09-12 LAB — URINALYSIS, ROUTINE W REFLEX MICROSCOPIC
Bilirubin Urine: NEGATIVE
Hgb urine dipstick: NEGATIVE
Ketones, ur: 15 — AB
Leukocytes, UA: NEGATIVE
Protein, ur: 30 — AB
Protein, ur: NEGATIVE
Urobilinogen, UA: 0.2
Urobilinogen, UA: 1

## 2011-09-12 LAB — BASIC METABOLIC PANEL
BUN: 16
BUN: 20
BUN: 25 — ABNORMAL HIGH
CO2: 24
CO2: 25
CO2: 26
Calcium: 8.1 — ABNORMAL LOW
Calcium: 8.3 — ABNORMAL LOW
Calcium: 8.7
Chloride: 104
Chloride: 109
Creatinine, Ser: 1.13
Creatinine, Ser: 1.24
GFR calc Af Amer: >60
GFR calc Af Amer: >60
GFR calc non Af Amer: 58 — ABNORMAL LOW
GFR calc non Af Amer: >60
GFR calc non Af Amer: >60
Glucose, Bld: 149 — ABNORMAL HIGH
Glucose, Bld: 176 — ABNORMAL HIGH
Glucose, Bld: 179 — ABNORMAL HIGH
Potassium: 4.1
Sodium: 132 — ABNORMAL LOW
Sodium: 136
Sodium: 136
Sodium: 137

## 2011-09-12 LAB — APTT: aPTT: 32

## 2011-09-12 LAB — CBC
HCT: 31 — ABNORMAL LOW
HCT: 31.5 — ABNORMAL LOW
Hemoglobin: 10.8 — ABNORMAL LOW
Hemoglobin: 11 — ABNORMAL LOW
Hemoglobin: 11.8 — ABNORMAL LOW
MCHC: 33.9
MCHC: 34.7
MCHC: 34.9
MCHC: 34.9
MCHC: 35
MCHC: 35
MCV: 89.7
MCV: 90.4
MCV: 90.5
Platelets: 140 — ABNORMAL LOW
Platelets: 163
Platelets: 168
Platelets: 182
Platelets: 198
RBC: 4.06 — ABNORMAL LOW
RBC: 4.16 — ABNORMAL LOW
RDW: 11.8
RDW: 12.3
RDW: 12.3
RDW: 12.5
RDW: 12.7
RDW: 12.8
WBC: 5.8
WBC: 6.4
WBC: 8.7

## 2011-09-12 LAB — CROSSMATCH: ABO/RH(D): O POS

## 2011-09-12 LAB — POCT I-STAT 3, VENOUS BLOOD GAS (G3P V)
Operator id: 3291
TCO2: 23
pCO2, Ven: 41.2 — ABNORMAL LOW
pH, Ven: 7.333 — ABNORMAL HIGH

## 2011-09-12 LAB — LIPID PANEL
Cholesterol: 130
HDL: 35 — ABNORMAL LOW
HDL: 40
LDL Cholesterol: 78
Total CHOL/HDL Ratio: 3.3
Total CHOL/HDL Ratio: 3.7
Triglycerides: 61
VLDL: 12

## 2011-09-12 LAB — MAGNESIUM: Magnesium: 2.5

## 2011-09-12 LAB — I-STAT EC8
BUN: 14
Bicarbonate: 20.1
Glucose, Bld: 156 — ABNORMAL HIGH
Sodium: 137
TCO2: 21
pCO2 arterial: 36.5
pH, Arterial: 7.349 — ABNORMAL LOW

## 2011-09-12 LAB — ABO/RH: ABO/RH(D): O POS

## 2011-09-12 LAB — DIFFERENTIAL
Basophils Absolute: 0
Lymphocytes Relative: 20
Lymphs Abs: 2.6
Monocytes Relative: 8
Neutro Abs: 3
Neutro Abs: 5.9
Neutrophils Relative %: 47

## 2011-09-12 LAB — BLOOD GAS, ARTERIAL
FIO2: 0.21
Patient temperature: 98.6
TCO2: 24.2
pH, Arterial: 7.425

## 2011-09-12 LAB — URINE CULTURE
Colony Count: NO GROWTH
Culture: NO GROWTH
Special Requests: POSITIVE

## 2011-09-12 LAB — PLATELET COUNT: Platelets: 186

## 2011-09-12 LAB — CREATININE, SERUM
Creatinine, Ser: 1.14
GFR calc Af Amer: >60

## 2011-09-12 LAB — HEMOGLOBIN A1C: Hgb A1c MFr Bld: 7.1 — ABNORMAL HIGH

## 2011-09-12 LAB — URINE MICROSCOPIC-ADD ON

## 2011-09-12 LAB — TSH: TSH: 0.122 — ABNORMAL LOW

## 2011-09-12 LAB — HEPARIN LEVEL (UNFRACTIONATED): Heparin Unfractionated: 0.5

## 2011-10-29 ENCOUNTER — Inpatient Hospital Stay (HOSPITAL_COMMUNITY)
Admission: RE | Admit: 2011-10-29 | Discharge: 2011-11-04 | DRG: 247 | Disposition: A | Discharge status: Home / Self Care | Payer: Self-pay | Source: Other Acute Inpatient Hospital | Attending: Cardiology | Admitting: Cardiology

## 2011-10-29 ENCOUNTER — Encounter (HOSPITAL_COMMUNITY): Payer: Self-pay | Admitting: Physician Assistant

## 2011-10-29 ENCOUNTER — Other Ambulatory Visit: Payer: Self-pay | Admitting: Physician Assistant

## 2011-10-29 ENCOUNTER — Other Ambulatory Visit: Payer: Self-pay

## 2011-10-29 DIAGNOSIS — I25810 Atherosclerosis of coronary artery bypass graft(s) without angina pectoris: Secondary | ICD-10-CM | POA: Diagnosis present

## 2011-10-29 DIAGNOSIS — E1159 Type 2 diabetes mellitus with other circulatory complications: Secondary | ICD-10-CM | POA: Diagnosis present

## 2011-10-29 DIAGNOSIS — E119 Type 2 diabetes mellitus without complications: Secondary | ICD-10-CM | POA: Diagnosis present

## 2011-10-29 DIAGNOSIS — I10 Essential (primary) hypertension: Secondary | ICD-10-CM | POA: Diagnosis present

## 2011-10-29 DIAGNOSIS — I200 Unstable angina: Secondary | ICD-10-CM

## 2011-10-29 DIAGNOSIS — R0602 Shortness of breath: Secondary | ICD-10-CM | POA: Diagnosis present

## 2011-10-29 DIAGNOSIS — E785 Hyperlipidemia, unspecified: Secondary | ICD-10-CM | POA: Diagnosis present

## 2011-10-29 DIAGNOSIS — I2510 Atherosclerotic heart disease of native coronary artery without angina pectoris: Principal | ICD-10-CM | POA: Diagnosis present

## 2011-10-29 DIAGNOSIS — E1169 Type 2 diabetes mellitus with other specified complication: Secondary | ICD-10-CM | POA: Diagnosis present

## 2011-10-29 DIAGNOSIS — E782 Mixed hyperlipidemia: Secondary | ICD-10-CM | POA: Diagnosis present

## 2011-10-29 DIAGNOSIS — I498 Other specified cardiac arrhythmias: Secondary | ICD-10-CM | POA: Diagnosis present

## 2011-10-29 DIAGNOSIS — E039 Hypothyroidism, unspecified: Secondary | ICD-10-CM | POA: Diagnosis present

## 2011-10-29 DIAGNOSIS — I152 Hypertension secondary to endocrine disorders: Secondary | ICD-10-CM | POA: Diagnosis present

## 2011-10-29 DIAGNOSIS — Z794 Long term (current) use of insulin: Secondary | ICD-10-CM

## 2011-10-29 DIAGNOSIS — R079 Chest pain, unspecified: Secondary | ICD-10-CM

## 2011-10-29 DIAGNOSIS — Z951 Presence of aortocoronary bypass graft: Secondary | ICD-10-CM

## 2011-10-29 DIAGNOSIS — Z7982 Long term (current) use of aspirin: Secondary | ICD-10-CM

## 2011-10-29 HISTORY — DX: Hypothyroidism, unspecified: E03.9

## 2011-10-29 HISTORY — DX: Hyperlipidemia, unspecified: E78.5

## 2011-10-29 HISTORY — DX: Essential (primary) hypertension: I10

## 2011-10-29 HISTORY — DX: Atherosclerotic heart disease of native coronary artery without angina pectoris: I25.10

## 2011-10-29 HISTORY — DX: Other specified postprocedural states: Z98.890

## 2011-10-29 HISTORY — DX: Gastro-esophageal reflux disease without esophagitis: K21.9

## 2011-10-29 LAB — DIFFERENTIAL
Basophils Absolute: 0 10*3/uL (ref 0.0–0.1)
Basophils Relative: 0 % (ref 0–1)
Monocytes Relative: 6 % (ref 3–12)
Neutro Abs: 7.9 10*3/uL — ABNORMAL HIGH (ref 1.7–7.7)
Neutrophils Relative %: 78 % — ABNORMAL HIGH (ref 43–77)

## 2011-10-29 LAB — COMPREHENSIVE METABOLIC PANEL
AST: 24 U/L (ref 0–37)
Albumin: 3.6 g/dL (ref 3.5–5.2)
Calcium: 9.4 mg/dL (ref 8.4–10.5)
Chloride: 102 mEq/L (ref 96–112)
Creatinine, Ser: 1.18 mg/dL (ref 0.50–1.35)

## 2011-10-29 LAB — CBC
Hemoglobin: 13.9 g/dL (ref 13.0–17.0)
MCHC: 34.8 g/dL (ref 30.0–36.0)
RBC: 4.51 MIL/uL (ref 4.22–5.81)
WBC: 10 10*3/uL (ref 4.0–10.5)

## 2011-10-29 LAB — CARDIAC PANEL(CRET KIN+CKTOT+MB+TROPI)
CK, MB: 4.3 ng/mL — ABNORMAL HIGH (ref 0.3–4.0)
Total CK: 148 U/L (ref 7–232)

## 2011-10-29 LAB — HEPARIN LEVEL (UNFRACTIONATED): Heparin Unfractionated: <0.1 IU/mL — ABNORMAL LOW (ref 0.30–0.70)

## 2011-10-29 LAB — GLUCOSE, CAPILLARY: Glucose-Capillary: 234 mg/dL — ABNORMAL HIGH (ref 70–99)

## 2011-10-29 LAB — PROTIME-INR
INR: 1.05 (ref 0.00–1.49)
Prothrombin Time: 13.9 seconds (ref 11.6–15.2)

## 2011-10-29 MED ORDER — ASPIRIN EC 325 MG PO TBEC: 325.0000 mg | DELAYED_RELEASE_TABLET | Freq: Every day | ORAL | Status: DC

## 2011-10-29 MED ORDER — SODIUM CHLORIDE 0.9 % IV SOLN
1.0000 mL/kg/h | INTRAVENOUS | Status: DC
  Administered 2011-10-30 (×2): 1 mL/kg/h via INTRAVENOUS

## 2011-10-29 MED ORDER — INSULIN ASPART 100 UNIT/ML ~~LOC~~ SOLN
10.0000 [IU] | Freq: Three times a day (TID) | SUBCUTANEOUS | Status: DC
  Filled 2011-10-29: qty 3

## 2011-10-29 MED ORDER — DIAZEPAM 5 MG PO TABS
5.0000 mg | ORAL_TABLET | ORAL | Status: AC
  Administered 2011-10-30: 5 mg via ORAL
  Filled 2011-10-29: qty 1

## 2011-10-29 MED ORDER — SODIUM CHLORIDE 0.9 % IJ SOLN: 3.0000 mL | INTRAMUSCULAR | Status: DC | PRN

## 2011-10-29 MED ORDER — ACETAMINOPHEN 80 MG PO CHEW: 80.0000 mg | CHEWABLE_TABLET | ORAL | Status: DC

## 2011-10-29 MED ORDER — INSULIN GLARGINE 100 UNIT/ML ~~LOC~~ SOLN
15.0000 [IU] | Freq: Every day | SUBCUTANEOUS | Status: DC
  Administered 2011-10-29 – 2011-10-31 (×3): 15 [IU] via SUBCUTANEOUS
  Filled 2011-10-29: qty 3

## 2011-10-29 MED ORDER — NITROGLYCERIN 0.4 MG SL SUBL: 0.4000 mg | SUBLINGUAL_TABLET | SUBLINGUAL | Status: DC | PRN

## 2011-10-29 MED ORDER — ASPIRIN 300 MG RE SUPP
300.0000 mg | RECTAL | Status: DC
  Filled 2011-10-29: qty 1

## 2011-10-29 MED ORDER — ASPIRIN EC 81 MG PO TBEC: 81.0000 mg | DELAYED_RELEASE_TABLET | Freq: Every day | ORAL | Status: DC

## 2011-10-29 MED ORDER — LEVOTHYROXINE SODIUM 200 MCG PO TABS
200.0000 ug | ORAL_TABLET | Freq: Every day | ORAL | Status: DC
  Administered 2011-10-30 – 2011-11-04 (×6): 200 ug via ORAL
  Filled 2011-10-29 (×6): qty 1

## 2011-10-29 MED ORDER — HEPARIN BOLUS VIA INFUSION
3500.0000 [IU] | Freq: Once | INTRAVENOUS | Status: AC
  Administered 2011-10-29: 3500 [IU] via INTRAVENOUS
  Filled 2011-10-29: qty 3500

## 2011-10-29 MED ORDER — LISINOPRIL 20 MG PO TABS
20.0000 mg | ORAL_TABLET | Freq: Every day | ORAL | Status: DC
  Administered 2011-10-30 – 2011-11-04 (×6): 20 mg via ORAL
  Filled 2011-10-29 (×6): qty 1

## 2011-10-29 MED ORDER — ASPIRIN 81 MG PO CHEW
324.0000 mg | CHEWABLE_TABLET | ORAL | Status: AC
  Administered 2011-10-30: 324 mg via ORAL
  Filled 2011-10-29: qty 4

## 2011-10-29 MED ORDER — SODIUM CHLORIDE 0.9 % IV SOLN
250.0000 mL | INTRAVENOUS | Status: DC
  Administered 2011-10-29: 250 mL via INTRAVENOUS

## 2011-10-29 MED ORDER — NITROGLYCERIN IN D5W 200-5 MCG/ML-% IV SOLN
3.0000 ug/min | INTRAVENOUS | Status: DC
  Administered 2011-10-29: 10 ug/min via INTRAVENOUS

## 2011-10-29 MED ORDER — HEPARIN (PORCINE) IN NACL 100-0.45 UNIT/ML-% IJ SOLN
1600.0000 [IU]/h | INTRAMUSCULAR | Status: DC
  Administered 2011-10-29: 1250 [IU]/h via INTRAVENOUS
  Administered 2011-10-30: 1600 [IU]/h via INTRAVENOUS
  Filled 2011-10-29 (×2): qty 250

## 2011-10-29 MED ORDER — ACETAMINOPHEN 325 MG PO TABS: 650.0000 mg | ORAL_TABLET | ORAL | Status: DC | PRN

## 2011-10-29 MED ORDER — ROSUVASTATIN CALCIUM 20 MG PO TABS
20.0000 mg | ORAL_TABLET | Freq: Every day | ORAL | Status: DC
  Administered 2011-10-29 – 2011-11-03 (×6): 20 mg via ORAL
  Filled 2011-10-29 (×8): qty 1

## 2011-10-29 MED ORDER — ONDANSETRON HCL 4 MG/2ML IJ SOLN
4.0000 mg | Freq: Four times a day (QID) | INTRAMUSCULAR | Status: DC | PRN
  Administered 2011-10-29: 4 mg via INTRAVENOUS
  Filled 2011-10-29: qty 2

## 2011-10-29 MED ORDER — SODIUM CHLORIDE 0.9 % IJ SOLN: 3.0000 mL | Freq: Two times a day (BID) | INTRAMUSCULAR | Status: DC

## 2011-10-29 MED ORDER — SODIUM CHLORIDE 0.9 % IJ SOLN
3.0000 mL | Freq: Two times a day (BID) | INTRAMUSCULAR | Status: DC
  Administered 2011-10-29: 3 mL via INTRAVENOUS

## 2011-10-29 MED ORDER — PANTOPRAZOLE SODIUM 40 MG PO TBEC
80.0000 mg | DELAYED_RELEASE_TABLET | Freq: Every day | ORAL | Status: DC
  Administered 2011-10-30 – 2011-11-04 (×6): 80 mg via ORAL
  Filled 2011-10-29: qty 1
  Filled 2011-10-29: qty 2
  Filled 2011-10-29 (×7): qty 1
  Filled 2011-10-29: qty 2

## 2011-10-29 MED FILL — Heparin Sodium (Porcine) 100 Unt/ML in Sodium Chloride 0.45%: INTRAMUSCULAR | Qty: 250 | Status: AC

## 2011-10-29 MED FILL — Morphine Sulfate Inj 10 MG/ML: INTRAMUSCULAR | Qty: 1 | Status: AC

## 2011-10-29 MED FILL — Nitroglycerin IV Soln 200 MCG/ML in D5W: INTRAVENOUS | Qty: 250 | Status: AC

## 2011-10-29 NOTE — H&P (Addendum)
Patient ID: Jeffrey Shaw MRN: [id number], DOB/AGE: Dec 05, 1951   Admit date: 10/29/2011 Date of Consult: @TODAY @  Primary Physician: Wyvonnia Lora, MD Primary Cardiologist: Jerral Bonito, MD  Pt. Profile: 60 year old Caucasian male significant for CAD, type 2 diabetes mellitus, hypertension and hyperlipidemia was transferred to [facility] from [facility] with worsening chest pain.  Problem List: Past Medical History  Diagnosis Date  . CAD (coronary artery disease)     CABG 01/2008  . HTN (hypertension)   . Hyperlipemia   . PONV (postoperative nausea and vomiting)     "if they give me Morphine"  . Shortness of breath 10/29/11     "w/exerction"  . Hypothyroid   . GERD (gastroesophageal reflux disease)   . Diabetes mellitus     "on insulin"    Past Surgical History  Procedure Date  . Coronary artery bypass graft 01/2008    X 6  . Carpal tunnel release     left hand  . Back surgery   . Lumbar disc surgery     removed  . Inguinal hernia repair     right  . Tonsillectomy ~ 1955  . Coronary angioplasty      Allergies:  Allergies  Allergen Reactions  . Morphine And Related Nausea Only    HPI: 60 year old Caucasian male significant for CAD (CABG 01/2008), type 2 diabetes mellitus, hypertension and hyperlipidemia was transferred to [facility] from [facility] with worsening chest pain.   Patient describes feeling of indigestion worsening over the past 3 weeks at rest. He reports walking in a fitness center this morning when he experienced substernal chest pain which he relates to previous episodes of angina. This was accompanied by palpitations and shortness of breath. He presented to [facility] ED, received 2L oxygen via n/c, nitroglycerin paste and morphine for pain and zofran for nausea. Heparin IV was initiated and was Plavix loaded.  The patient works out on a regular basis without any angina, but today experienced chest pain.   Given his cardiac  history, risk factors and similar anginal symptoms experienced in the past, he was transferred to [facility] for cardiac catheterization.   Outpatient Medications: ASA 81mg  daily Nexium 20mg  daily Novolog 10-20 units TID before meals Lantus 15 unites qhs Synthroid daily Lisinopril 20mg  daily Zocor 80mg  daily    Inpatient Medications:     . aspirin EC  325 mg Oral Daily  . insulin aspart  10-20 Units Subcutaneous TID AC  . insulin glargine  15 Units Subcutaneous QHS  . levothyroxine  200 mcg Oral Daily  . lisinopril  20 mg Oral Daily  . pantoprazole  80 mg Oral Daily  . rosuvastatin  20 mg Oral Daily  . sodium chloride  3 mL Intravenous Q12H  . DISCONTD: acetaminophen  80 mg Oral 1 day or 1 dose  . DISCONTD: aspirin EC  81 mg Oral Daily  . DISCONTD: aspirin  300 mg Rectal NOW     Family History  Problem Relation Age of Onset  . Diabetes Maternal Aunt      History   Social History  . Marital Status: Married    Spouse Name: N/A    Number of Children: N/A  . Years of Education: N/A   Occupational History  . Not on file.   Social History Main Topics  . Smoking status: Former Smoker    Types: Cigarettes    Quit date: 12/22/1968  . Smokeless tobacco: Never  Used  . Alcohol Use: 1.2 oz/week    2 Cans of beer per week  . Drug Use: No  . Sexually Active: Not on file   Other Topics Concern  . Not on file   Social History Narrative  . No narrative on file     Review of Systems: General: negative for chills, fever, night sweats or weight changes.  Cardiovascular: negative for edema, orthopnea, paroxysmal nocturnal dyspnea  Respiratory: negative for cough or wheezing Abdominal: negative forvomiting, diarrhea, Neurologic: negative for visual changes, syncope, or dizziness All other systems reviewed and are otherwise negative except as noted above.  Physical Exam: Blood pressure 123/71, pulse 50, temperature 97.6 F (36.4 C), temperature source Oral, resp.  rate 18, height 6\' 4"  (1.93 m), weight 119.84 kg (264 lb 3.2 oz), SpO2 94.00%.    General: Well developed, well nourished, in no acute distress. Head: Normocephalic, atraumatic, sclera non-icteric, no xanthomas, nares are without discharge. Neck: Negative for carotid bruits. JVD not elevated, supple Lungs: Clear bilaterally to auscultation without wheezes, rales, or rhonchi. Breathing is unlabored. Heart: RRR with S1 S2. No murmurs, rubs, or gallops appreciated. Abdomen: Soft, non-tender, non-distended with normoactive bowel sounds. No hepatomegaly. No rebound/guarding. No obvious abdominal masses. Msk:  Strength and tone appears normal for age, sternotomy scar on chest Extremities: No clubbing, cyanosis or edema.  Distal pedal pulses are 2+ and equal bilaterally. Neuro: Alert and oriented X 3. Moves all extremities spontaneously. Psych:  Responds to questions appropriately with a normal affect.  Labs: pending   Radiology/Studies: No results found.  EKG: Sinus bradycardia, 48 bpm; no ST-T wave changes  ASSESSMENT AND PLAN:   1. Unstable angina - hx of CAD s/p CABG 01/2008 - will cycle CEs - EKG now and in AM - NPO after midnight - will undergo cardiac catheterization in the AM  2. Type 2 Diabetes Mellitus - SSI per protocol  3. HTN - continue outpatient Lisinopril 20mg  PO daily  4. Hyperlipidemia - replace Zocor 80mg  to Crestor 20mg  PO daily   5. Hypothyroidism - supplement with outpatient Synthroid PO daily  Signed, Odella Aquas, PA-C 10/29/2011, 5:13 PM    Addendum:  Been reviewed the patient and examined the patient with Odella Aquas, PA.  Agree that the patient has symptoms concerning for unstable angina. It is curious that he has been able to exercise on regular basis without any angina-like problems but he had more angina today. He has been having episodes of angina indigestion for the past week or so.  On exam his vital signs are stable. His lungs  are clear. Heart regular rate S1-S2. His pulses are intact.  His EKG reveals sinus bradycardia. He has no ST or T wave changes. Has an old inferior wall myocardial infarction.  We will anticipate doing a heart catheterization tomorrow. If it is negative for any significant coronary artery disease, we will pursue a GI workup.  Vesta Mixer, Montez Hageman., MD, [facility]      10/29/2011, 5:35 PM

## 2011-10-29 NOTE — Progress Notes (Signed)
ANTICOAGULATION CONSULT NOTE - Follow Up Consult  Pharmacy Consult for Heparin Indication: chest pain/ACS  Allergies  Allergen Reactions  . Morphine And Related Nausea Only    Patient Measurements: Height: 6\' 4"  (193 cm) Weight: 264 lb 3.2 oz (119.84 kg) IBW/kg (Calculated) : 86.8  Heparin Weight: 110 kg  Vital Signs: Temp: 97.6 F (36.4 C) (11/07 1535) Temp src: Oral (11/07 1535) BP: 123/71 mmHg (11/07 1535) Pulse Rate: 50  (11/07 1535)  Labs:  [facility] 10/29/11 2026 10/29/11 1742  HGB -- 13.9  HCT -- 39.9  PLT -- 176  APTT -- --  LABPROT -- 13.9  INR -- 1.05  HEPARINUNFRC <0.10* --  CREATININE -- 1.18  CKTOTAL -- 148  CKMB -- 4.3*  TROPONINI -- <0.30   Estimated Creatinine Clearance: 93 ml/min (by C-G formula based on Cr of 1.18).   Medications:  Prescriptions prior to admission  Medication Sig Dispense Refill  . aspirin EC 81 MG tablet Take 81 mg by mouth daily.        Marland Kitchen esomeprazole (NEXIUM) 20 MG capsule Take 20 mg by mouth daily before breakfast.        . insulin aspart (NOVOLOG) 100 UNIT/ML injection Inject 10-20 Units into the skin 3 (three) times daily before meals. Patient uses home sliding scale       . insulin glargine (LANTUS) 100 UNIT/ML injection Inject 15 Units into the skin at bedtime.        Marland Kitchen levothyroxine (SYNTHROID, LEVOTHROID) 200 MCG tablet Take 200 mcg by mouth daily.        Marland Kitchen lisinopril (PRINIVIL,ZESTRIL) 20 MG tablet Take 20 mg by mouth daily.        . simvastatin (ZOCOR) 80 MG tablet Take 80 mg by mouth at bedtime.         Scheduled:    . aspirin  324 mg Oral Pre-Cath  . aspirin EC  325 mg Oral Daily  . diazepam  5 mg Oral On Call  . insulin aspart  10-20 Units Subcutaneous TID AC  . insulin glargine  15 Units Subcutaneous QHS  . levothyroxine  200 mcg Oral Daily  . lisinopril  20 mg Oral Daily  . pantoprazole  80 mg Oral Daily  . rosuvastatin  20 mg Oral Daily  . sodium chloride  3 mL Intravenous Q12H  . sodium chloride  3 mL  Intravenous Q12H  . DISCONTD: acetaminophen  80 mg Oral 1 day or 1 dose  . DISCONTD: aspirin EC  81 mg Oral Daily  . DISCONTD: aspirin  300 mg Rectal NOW    Assessment: 60 y.o. M on heparin for ACS sx with a SUBtherapeutic heparin level this evening. The patient is awaiting cath in a.m and was started on heparin at [facility]. No s/sx of bleeding noted.  Goal of Therapy:  Heparin level 0.3-0.7 units/ml   Plan:  1) Heparin 3500 unit bolus x 1 2) Increase heparin drip rate to 1600 units/hr (16 ml/hr) 3) Will obtain heparin level 6 hours after drip rate changed  Rolley Sims 10/29/2011,9:56 PM

## 2011-10-29 NOTE — Progress Notes (Signed)
ANTICOAGULATION CONSULT NOTE - Initial Consult  Pharmacy Consult for Heparin Indication: chest pain/ACS  Allergies  Allergen Reactions  . Morphine And Related Nausea Only    Patient Measurements: Height: 6\' 4"  (193 cm) Weight: 264 lb 3.2 oz (119.84 kg) IBW/kg (Calculated) : 86.8  Heparin Weight: 110 kg  Vital Signs: Temp: 97.6 F (36.4 C) (11/07 1535) Temp src: Oral (11/07 1535) BP: 123/71 mmHg (11/07 1535) Pulse Rate: 50  (11/07 1535)  Labs: No results found for this basename: HGB:2,HCT:3,PLT:3,APTT:3,LABPROT:3,INR:3,HEPARINUNFRC:3,CREATININE:3,CKTOTAL:3,CKMB:3,TROPONINI:3 in the last 72 hours Estimated Creatinine Clearance: 88.5 ml/min (by C-G formula based on Cr of 1.24).  Medical History: Past Medical History  Diagnosis Date  . CAD (coronary artery disease)     CABG 01/2008  . HTN (hypertension)   . Hyperlipemia   . PONV (postoperative nausea and vomiting)     "if they give me Morphine"  . Shortness of breath 10/29/11     "w/exerction"  . Hypothyroid   . GERD (gastroesophageal reflux disease)   . Diabetes mellitus     "on insulin"    Medications:  Prescriptions prior to admission  Medication Sig Dispense Refill  . aspirin EC 81 MG tablet Take 81 mg by mouth daily.        Marland Kitchen esomeprazole (NEXIUM) 20 MG capsule Take 20 mg by mouth daily before breakfast.        . insulin aspart (NOVOLOG) 100 UNIT/ML injection Inject 10-20 Units into the skin 3 (three) times daily before meals. Patient uses home sliding scale       . insulin glargine (LANTUS) 100 UNIT/ML injection Inject 15 Units into the skin at bedtime.        Marland Kitchen levothyroxine (SYNTHROID, LEVOTHROID) 200 MCG tablet Take 200 mcg by mouth daily.        Marland Kitchen lisinopril (PRINIVIL,ZESTRIL) 20 MG tablet Take 20 mg by mouth daily.        . simvastatin (ZOCOR) 80 MG tablet Take 80 mg by mouth at bedtime.         Scheduled:    . aspirin  324 mg Oral Pre-Cath  . aspirin EC  325 mg Oral Daily  . diazepam  5 mg Oral On  Call  . insulin aspart  10-20 Units Subcutaneous TID AC  . insulin glargine  15 Units Subcutaneous QHS  . levothyroxine  200 mcg Oral Daily  . lisinopril  20 mg Oral Daily  . pantoprazole  80 mg Oral Daily  . rosuvastatin  20 mg Oral Daily  . sodium chloride  3 mL Intravenous Q12H  . sodium chloride  3 mL Intravenous Q12H  . DISCONTD: acetaminophen  80 mg Oral 1 day or 1 dose  . DISCONTD: aspirin EC  81 mg Oral Daily  . DISCONTD: aspirin  300 mg Rectal NOW    Assessment: 60 y.o. M transferred from [facility] already on heparin drip for ACS sx. An order was written for the patient to receive a 4000 unit bolus and to start at a drip rate of 1240 units/hr around 1400 at [facility]. The patient transferred here at a drip rate of 1240 units/hr. Will check a heparin level this evening and re-address heparin at that point. The patient is planned to go to cath in the a.m. No signs/symptoms of bleeding noted at this time.  Goal of Therapy:  Heparin level 0.3-0.7 units/ml   Plan: 1) Continue heparin drip rate at 1240 units/hr 2) Obtain heparin level at 2100 and re-evaluate drip rate at that  time 3) Will continue to monitor patient for any s/sx of bleeding.  Rolley Sims 10/29/2011,5:27 PM

## 2011-10-30 ENCOUNTER — Encounter (HOSPITAL_COMMUNITY)
Admission: RE | Disposition: A | Discharge status: Home / Self Care | Payer: Self-pay | Source: Other Acute Inpatient Hospital | Attending: Cardiology

## 2011-10-30 ENCOUNTER — Other Ambulatory Visit: Payer: Self-pay

## 2011-10-30 DIAGNOSIS — I2510 Atherosclerotic heart disease of native coronary artery without angina pectoris: Secondary | ICD-10-CM

## 2011-10-30 HISTORY — PX: LEFT HEART CATHETERIZATION WITH CORONARY/GRAFT ANGIOGRAM: SHX5450

## 2011-10-30 LAB — LIPID PANEL
LDL Cholesterol: 73 mg/dL (ref 0–99)
Triglycerides: 48 mg/dL (ref <150)

## 2011-10-30 LAB — HEMOGLOBIN A1C
Hgb A1c MFr Bld: 7.3 % — ABNORMAL HIGH (ref <5.7)
Mean Plasma Glucose: 163 mg/dL — ABNORMAL HIGH (ref <117)

## 2011-10-30 LAB — BASIC METABOLIC PANEL
CO2: 20 mEq/L (ref 19–32)
Calcium: 9.1 mg/dL (ref 8.4–10.5)
Creatinine, Ser: 1.28 mg/dL (ref 0.50–1.35)
Glucose, Bld: 350 mg/dL — ABNORMAL HIGH (ref 70–99)

## 2011-10-30 LAB — CBC
HCT: 40.4 % (ref 39.0–52.0)
Hemoglobin: 13.7 g/dL (ref 13.0–17.0)
MCH: 30.5 pg (ref 26.0–34.0)
MCV: 90 fL (ref 78.0–100.0)
RBC: 4.49 MIL/uL (ref 4.22–5.81)

## 2011-10-30 LAB — GLUCOSE, CAPILLARY
Glucose-Capillary: 325 mg/dL — ABNORMAL HIGH (ref 70–99)
Glucose-Capillary: 315 mg/dL — ABNORMAL HIGH (ref 70–99)
Glucose-Capillary: 277 mg/dL — ABNORMAL HIGH (ref 70–99)
Glucose-Capillary: 283 mg/dL — ABNORMAL HIGH (ref 70–99)
Glucose-Capillary: 286 mg/dL — ABNORMAL HIGH (ref 70–99)
Glucose-Capillary: 274 mg/dL — ABNORMAL HIGH (ref 70–99)

## 2011-10-30 LAB — CARDIAC PANEL(CRET KIN+CKTOT+MB+TROPI): CK, MB: 3.9 ng/mL (ref 0.3–4.0)

## 2011-10-30 SURGERY — LEFT HEART CATHETERIZATION WITH CORONARY/GRAFT ANGIOGRAM
Anesthesia: LOCAL

## 2011-10-30 SURGERY — LEFT HEART CATHETERIZATION WITH CORONARY/GRAFT ANGIOGRAM
Anesthesia: Moderate Sedation

## 2011-10-30 MED ORDER — ASPIRIN EC 81 MG PO TBEC
81.0000 mg | DELAYED_RELEASE_TABLET | Freq: Every day | ORAL | Status: DC
  Administered 2011-10-31 – 2011-11-04 (×5): 81 mg via ORAL
  Filled 2011-10-30 (×7): qty 1

## 2011-10-30 MED ORDER — MIDAZOLAM HCL 2 MG/2ML IJ SOLN
INTRAMUSCULAR | Status: AC
  Administered 2011-10-30: 08:00:00
  Filled 2011-10-30: qty 2

## 2011-10-30 MED ORDER — FENTANYL CITRATE 0.05 MG/ML IJ SOLN
INTRAMUSCULAR | Status: AC
  Administered 2011-10-30: 08:00:00
  Filled 2011-10-30: qty 2

## 2011-10-30 MED ORDER — SODIUM CHLORIDE 0.9 % IV SOLN: 1.0000 mL/kg/h | INTRAVENOUS | Status: DC

## 2011-10-30 MED ORDER — INSULIN ASPART 100 UNIT/ML ~~LOC~~ SOLN
1.0000 [IU] | Freq: Three times a day (TID) | SUBCUTANEOUS | Status: DC
  Administered 2011-10-30: 4 [IU] via SUBCUTANEOUS
  Administered 2011-10-31: 5 [IU] via SUBCUTANEOUS
  Administered 2011-10-31: 4 [IU] via SUBCUTANEOUS
  Administered 2011-11-01: 2 [IU] via SUBCUTANEOUS
  Administered 2011-11-01: 1 [IU] via SUBCUTANEOUS
  Administered 2011-11-01: 5 [IU] via SUBCUTANEOUS
  Administered 2011-11-01: 3 [IU] via SUBCUTANEOUS
  Administered 2011-11-02: 2 [IU] via SUBCUTANEOUS
  Administered 2011-11-02: 6 [IU] via SUBCUTANEOUS
  Administered 2011-11-03 (×2): 4 [IU] via SUBCUTANEOUS
  Administered 2011-11-03: 2 [IU] via SUBCUTANEOUS
  Administered 2011-11-04: 4 [IU] via SUBCUTANEOUS
  Filled 2011-10-30: qty 3

## 2011-10-30 MED ORDER — INSULIN ASPART 100 UNIT/ML ~~LOC~~ SOLN
0.0000 [IU] | SUBCUTANEOUS | Status: DC
  Administered 2011-10-30 (×2): 5 [IU] via SUBCUTANEOUS
  Administered 2011-10-30: 7 [IU] via SUBCUTANEOUS
  Filled 2011-10-30: qty 3

## 2011-10-30 MED ORDER — SODIUM CHLORIDE 0.9 % IV SOLN: INTRAVENOUS | Status: DC

## 2011-10-30 MED ORDER — ACETAMINOPHEN 325 MG PO TABS: 650.0000 mg | ORAL_TABLET | ORAL | Status: DC | PRN

## 2011-10-30 MED ORDER — HEPARIN (PORCINE) IN NACL 2-0.9 UNIT/ML-% IJ SOLN
INTRAMUSCULAR | Status: AC
  Filled 2011-10-30: qty 2000

## 2011-10-30 MED ORDER — NITROGLYCERIN 0.2 MG/ML ON CALL CATH LAB
INTRAVENOUS | Status: AC
  Administered 2011-10-30: 08:00:00
  Filled 2011-10-30: qty 1

## 2011-10-30 MED ORDER — SODIUM CHLORIDE 0.9 % IV SOLN
INTRAVENOUS | Status: AC
  Administered 2011-10-30 (×2): via INTRAVENOUS

## 2011-10-30 MED ORDER — LIDOCAINE HCL (PF) 1 % IJ SOLN
INTRAMUSCULAR | Status: AC
  Filled 2011-10-30: qty 30

## 2011-10-30 NOTE — Progress Notes (Signed)
Patient Name: Jeffrey Shaw Date of Encounter: @TODAY @   Principal Problem:  *CAD, NATIVE VESSEL Active Problems:  HYPOTHYROIDISM  DIABETES MELLITUS  HYPERLIPIDEMIA-MIXED  HYPERTENSION, UNSPECIFIED  CORONARY ARTERY BYPASS GRAFT, HX OF    SUBJECTIVE: Pt states that he feels well immediately post procedure. He denies swelling, bruising, cold extremities and numbness, chest pain, shortness of breath, nausea, vomiting palpitations, diaphoresis and abdominal pain. Patient does not report other complaints.   OBJECTIVE  Filed Vitals:    10/29/11 2200 10/30/11 0600  BP:  113/65 146/76  Pulse:  60 74  Temp:  98.2 F (36.8 C) 98.4 F (36.9 C)  TempSrc:   Oral  Resp:  18 14  Height:     Weight:   119.2 kg (262 lb 12.6 oz)  SpO2:  94% 95%    Intake/Output Summary (Last 24 hours) at 10/30/11 1041 Last data filed at 10/30/11 0600  Gross per 24 hour  Intake    480 ml  Output      0 ml  Net    480 ml   PHYSICAL EXAM  General: Well developed, well nourished, in no acute distress. Head: Normocephalic, atraumatic, sclera non-icteric Neck: Supple without bruits or JVD. Lungs:  Resp regular and unlabored, CTA, no wheezes, rales or rhonchi Heart: RRR no s3, s4, or murmurs. Abdomen: Soft, non-tender, non-distended, BS + x 4, no flank ecchymosis  Msk:  Strength and tone appears normal for age. Skin: No erythema, edema, cyanosis, no bruit at catheterization site Extremities: No clubbing, cyanosis or edema. DP/PT/Radials 2+ and equal bilaterally. Neuro: Alert and oriented X 3. Moves all extremities spontaneously. Psych: Normal affect.  LABS:  CBC:  Basename 10/30/11 0520 10/29/11 1742  WBC 8.2 10.0  NEUTROABS -- 7.9*  HGB 13.7 13.9  HCT 40.4 39.9  MCV 90.0 88.5  PLT 190 176   Basic Metabolic Panel:  Basename 10/30/11 0520 10/29/11 1742  NA 132* 138  K 4.5 4.5  CL 97 102  CO2 20 25  GLUCOSE 350* 172*  BUN 26* 18  CREATININE 1.28 1.18  CALCIUM 9.1 9.4  MG -- --  PHOS  -- --   Liver Function Tests:  Basename 10/29/11 1742  AST 24  ALT 21  ALKPHOS 65  BILITOT 1.7*  PROT 6.9  ALBUMIN 3.6   Cardiac Enzymes:  Basename 10/30/11 0004 10/29/11 1742  CKTOTAL 119 148  CKMB 3.9 4.3*  CKMBINDEX -- --  TROPONINI <0.30 <0.30    Basename 10/29/11 1742  HGBA1C 7.3*   Fasting Lipid Panel:  Basename 10/30/11 0520  CHOL 139  HDL 56  LDLCALC 73  TRIG 48  CHOLHDL 2.5  LDLDIRECT --   Thyroid Function Tests:  Basename 10/29/11 1742  TSH 1.575            Radiology/Studies:  Cardiac catheterization: Coronary angiography performed 10/30/2011  Left mainstem: The LMCA has a 70% distal stenosis resulting in a bifurcation lesion that involves with more severe involvement of the LCX proximal vessel.   Left anterior descending (LAD): There is an ostial stenosis of 70% then 50% before the diagonal lesion. The diagonal lesion fills. The internal mammary graft to the LAD is widely patent. The vein graft to the first diagonal was occluded. There was severe disease of the first diagonal on the previous study. There may be feint collaterals to the diagonal via the distal LAD.   Left circumflex (LCx): The circumflex has an ostial lesion of 80% leading into a large caliber vessel  that provides three marginal branches. The SVG to the OM-1, 2, and 3 is occluded. There is segmental 60-70% involvement of the proximal OM1. The OM-2 has 50% mid disease, and the AV portion leading to the OM-3 has 50% leading to the OM-3.   Right coronary artery (RCA): The RCA is totally occluded proximally. There is a radial artery to the distal RCA/PDA that is widely patent with excellent flow to the distal vessel. The continuation branch of the RCA, retrograde from the graft, has moderate plaque but good flow.   Left ventriculography: Not performed  Conclusions:   1. Patent LIMA to the LAD  2. Patent radial artery to the RCA with some feint collaterals to the CFX  3. Occluded SVGs  to the diagonal, and OM 1,2,and 3.  4. Normal LVEDP   ASSESSMENT AND PLAN:  1. CAD/Unstable angina: Patient did not receive PCI due to elevated blood glucose from held insulin prior to the procedure.  Contrast was limited due to slight elevation in creatinine. - Recommend PCI of the LMCA into the CFX  - Will be deferred until Monday. - Will recheck BMET in the a.m. - Continue scheduled inpatient medications including SSI  2. Type 2 diabetes mellitus - continue SSI per protocol for adequate BG management  3. HTN  - continue Lisinopril 20mg  PO daily   4. Hyperlipidemia  - continue Crestor 20mg  PO daily   5. Hypothyroidism  - continue Synthroid PO daily    Signed, Kinza Gouveia , PA-C

## 2011-10-30 NOTE — Procedures (Signed)
Cardiac Catheterization Procedure Note  Name: Jeffrey Shaw MRN: [id number] DOB: 10/26/1951  Procedure: Left Heart Cath, Selective Coronary Angiography, Saphenous Vein Angiography  Indication: Unstable angina   Procedural details: The right groin was prepped, draped, and anesthetized with 1% lidocaine. Using modified Seldinger technique, a 5 French sheath was introduced into the right femoral artery.  We measured LV pressure first, and then LVEDP was 10.  300cc of normal saline were given.  We also gave about 150cc NS through a diagnostic catheter before.  Standard 54F catheters were used for coronary angiography, SGA, and SLIMA. Total contrast use was less than 75cc total.  Catheter exchanges were performed over a guidewire. There were no immediate procedural complications. The patient was transferred to the post catheterization recovery area for further monitoring.  I reviewed with the patient, and also with the patient's spouse.  Procedural Findings:  Hemodynamics:     AO 116/61 (83)    LV  105/5-10    Coronary angiography:   Coronary dominance: right    Left mainstem:   The LMCA has a 70% distal stenosis resulting in a bifurcation lesion that involves with more severe involvement of the LCX proximal vessel.     Left anterior descending (LAD):  There is an ostial stenosis of 70% then 50% before the diagonal lesion.  The diagonal lesion fills.  The internal mammary graft to the LAD is widely patent.  The vein graft to the first diagonal was occluded.  There was severe disease of the first diagonal on the previous study.  There may be feint collaterals to the diagonal via the distal LAD.      Left circumflex (LCx):  The circumflex has an ostial lesion of 80% leading into a large caliber vessel that provides three marginal branches.  The SVG to the OM-1, 2, and 3 is occluded.  There is segmental 60-70% involvement of the proximal OM1.  The OM-2 has 50% mid disease, and the AV portion leading to  the OM-3 has 50% leading to the OM-3.    Right coronary artery (RCA): The RCA is totally occluded proximally.  There is a radial artery to the distal RCA/PDA that is widely patent with excellent flow to the distal vessel.  The continuation branch of the RCA, retrograde from the graft, has moderate plaque but good flow.    Left ventriculography:  Not performed  Final Conclusions:  1.  Patent LIMA to the LAD                                    2.  Patent radial artery to the RCA with some feint collaterals to the CFX                                   3.  Occluded SVGs to the diagonal, and OM 1,2,and 3.                                   4.  Normal LVEDP  Recommendations:  Insulin dependent diabetic with unstable angina at present.  He has a complex LMCA supplying the diagonal territory, and large OM distribution.  PCI of the LMCA into the CFX seems most appropriate.  With slight elevation in Cr, contrast was limited.  His  sugar is elevated.  Will recheck BMET in the am, and defer PCI until Monday.    [name] 10/30/2011, 9:31 AM

## 2011-10-30 NOTE — Interval H&P Note (Signed)
History and Physical Interval Note:   10/30/2011   7:43 AM  The patient presented for cath. He has symptoms which are similar to before his prior CABG.  Currently, he is stable and his status is unchanged from presentation.  His glucose is elevated from baseline due to holding of his insulin, and his BUN is up slightly.  He is being given fluids in the lab.  We will not perform LV angiography, measuring only pressure.  TS

## 2011-10-31 ENCOUNTER — Other Ambulatory Visit: Payer: Self-pay

## 2011-10-31 ENCOUNTER — Encounter (HOSPITAL_COMMUNITY): Payer: Self-pay

## 2011-10-31 LAB — BASIC METABOLIC PANEL
BUN: 18 mg/dL (ref 6–23)
Creatinine, Ser: 1.09 mg/dL (ref 0.50–1.35)
GFR calc Af Amer: 83 mL/min — ABNORMAL LOW (ref >90)
GFR calc non Af Amer: 71 mL/min — ABNORMAL LOW (ref >90)
Glucose, Bld: 248 mg/dL — ABNORMAL HIGH (ref 70–99)
Potassium: 4.7 mEq/L (ref 3.5–5.1)

## 2011-10-31 LAB — GLUCOSE, CAPILLARY
Glucose-Capillary: 265 mg/dL — ABNORMAL HIGH (ref 70–99)
Glucose-Capillary: 114 mg/dL — ABNORMAL HIGH (ref 70–99)

## 2011-10-31 MED ORDER — DOCUSATE SODIUM 100 MG PO CAPS
200.0000 mg | ORAL_CAPSULE | Freq: Every day | ORAL | Status: DC
  Administered 2011-10-31 – 2011-11-03 (×3): 200 mg via ORAL
  Filled 2011-10-31 (×5): qty 2

## 2011-10-31 MED ORDER — INSULIN ASPART 100 UNIT/ML ~~LOC~~ SOLN
10.0000 [IU] | Freq: Three times a day (TID) | SUBCUTANEOUS | Status: DC
  Administered 2011-10-31 – 2011-11-04 (×11): 10 [IU] via SUBCUTANEOUS
  Filled 2011-10-31: qty 3

## 2011-10-31 MED ORDER — INSULIN ASPART 100 UNIT/ML ~~LOC~~ SOLN
10.0000 [IU] | Freq: Three times a day (TID) | SUBCUTANEOUS | Status: DC
  Filled 2011-10-31: qty 3

## 2011-10-31 MED ORDER — CLOPIDOGREL BISULFATE 75 MG PO TABS
300.0000 mg | ORAL_TABLET | Freq: Once | ORAL | Status: AC
  Administered 2011-10-31: 300 mg via ORAL
  Filled 2011-10-31: qty 4

## 2011-10-31 NOTE — Progress Notes (Signed)
Inpatient Diabetes Program Recommendations  AACE/ADA: New Consensus Statement on Inpatient Glycemic Control (2009)  Target Ranges:  Prepandial:   less than 140 mg/dL      Peak postprandial:   less than 180 mg/dL (1-2 hours)      Critically ill patients:  140 - 180 mg/dL   Reason for Visit: referral received.  Inpatient Diabetes Program Recommendations Correction (SSI): Continue current correction scale. Insulin - Meal Coverage: Spoke to patient regarding how he takes insulin at home.  Recommend adding Novolog 10 units tid with meals to cover carbohydrate intake-Hold if patient NPO. HgbA1C: A1c=7.3%.  Note:

## 2011-10-31 NOTE — Progress Notes (Signed)
Subjective:  Case reviewed with Dr. Clifton James.  Patient is stable.  He had significant pain on Wednesday and thought he was having an MI.  As such, we have been reluctant to send him home.  He will have PCI of LMCA prox CFX on Monday. Glucoses are high because the pharmacy cannot figure the best way to allow him to adjust his sliding scale  (does it in his head).  Objective:  Vital Signs in the last 24 hours: Temp:  [98.2 F (36.8 C)-98.9 F (37.2 C)] 98.2 F (36.8 C) (11/09 0500) Pulse Rate:  [62-72] 67  (11/09 0500) Resp:  [16-18] 18  (11/09 0500) BP: (107-144)/(55-75) 142/58 mmHg (11/09 0500) SpO2:  [95 %-96 %] 96 % (11/09 0500) Weight:  [117.935 kg (260 lb)] 260 lb (117.935 kg) (11/09 0500)  Intake/Output from previous day: 11/08 0701 - 11/09 0700 In: 360 [P.O.:360] Out: 351 [Urine:351]   Physical Exam: General: Well developed, well nourished, in no acute distress. Head:  Normocephalic and atraumatic. Lungs: Clear to auscultation and percussion. Heart: Normal S1 and S2.  No murmur, rubs or gallops.  Pulses: Pulses normal in all 4 extremities. Extremities: No clubbing or cyanosis. No edema. Neurologic: Alert and oriented x 3.    Lab Results:  Basename 10/30/11 0520 10/29/11 1742  WBC 8.2 10.0  HGB 13.7 13.9  PLT 190 176    Basename 10/31/11 0500 10/30/11 0520  NA 137 132*  K 4.7 4.5  CL 103 97  CO2 22 20  GLUCOSE 248* 350*  BUN 18 26*  CREATININE 1.09 1.28    Basename 10/30/11 0004 10/29/11 1742  TROPONINI <0.30 <0.30   Hepatic Function Panel  Basename 10/29/11 1742  PROT 6.9  ALBUMIN 3.6  AST 24  ALT 21  ALKPHOS 65  BILITOT 1.7*  BILIDIR --  IBILI --    Basename 10/30/11 0520  CHOL 139   No results found for this basename: PROTIME in the last 72 hours  Imaging: No results found.    Assessment/Plan:  Patient Active Hospital Problem List: CAD, NATIVE VESSEL (08/02/2009)   Assessment: Stable.  No recurrent pain.   Plan: load with  clopidogrel, and reassess P2Y12 on Sunday, as he is a hybrid between stable and ACS HYPOTHYROIDISM (08/02/2009)   Assessment: see plans   Plan: see plans DIABETES MELLITUS (08/02/2009)   Assessment: higher glucoses   Plan: Have pharmacy adjust sliding scale     [name], MD, [facility], FSCAI 10/31/2011, 10:35 AM

## 2011-11-01 ENCOUNTER — Encounter (HOSPITAL_COMMUNITY): Payer: Self-pay

## 2011-11-01 LAB — BASIC METABOLIC PANEL
Calcium: 9.1 mg/dL (ref 8.4–10.5)
Creatinine, Ser: 1.05 mg/dL (ref 0.50–1.35)
GFR calc non Af Amer: 75 mL/min — ABNORMAL LOW (ref >90)
Glucose, Bld: 339 mg/dL — ABNORMAL HIGH (ref 70–99)
Sodium: 132 mEq/L — ABNORMAL LOW (ref 135–145)

## 2011-11-01 LAB — GLUCOSE, CAPILLARY
Glucose-Capillary: 321 mg/dL — ABNORMAL HIGH (ref 70–99)
Glucose-Capillary: 218 mg/dL — ABNORMAL HIGH (ref 70–99)
Glucose-Capillary: 179 mg/dL — ABNORMAL HIGH (ref 70–99)

## 2011-11-01 MED ORDER — INSULIN GLARGINE 100 UNIT/ML ~~LOC~~ SOLN
20.0000 [IU] | Freq: Every day | SUBCUTANEOUS | Status: DC
  Administered 2011-11-01: 20 [IU] via SUBCUTANEOUS
  Administered 2011-11-02: 10 [IU] via SUBCUTANEOUS

## 2011-11-01 MED ORDER — POLYETHYLENE GLYCOL 3350 17 G PO PACK
17.0000 g | PACK | Freq: Every day | ORAL | Status: DC | PRN
  Administered 2011-11-01: 17 g via ORAL
  Filled 2011-11-01: qty 1

## 2011-11-01 MED ORDER — INSULIN ASPART 100 UNIT/ML ~~LOC~~ SOLN
5.0000 [IU] | Freq: Once | SUBCUTANEOUS | Status: AC
  Administered 2011-11-01: 5 [IU] via SUBCUTANEOUS

## 2011-11-01 NOTE — Progress Notes (Signed)
  Subjective:  No angina.  Looking forward to watching college football today.    Objective:  Vital Signs in the last 24 hours: Temp:  [97.5 F (36.4 C)-98.3 F (36.8 C)] 98.3 F (36.8 C) (11/10 0518) Pulse Rate:  [45-70] 66  (11/10 0518) Resp:  [16-20] 16  (11/10 0518) BP: (134-160)/(61-80) 160/80 mmHg (11/10 0518) SpO2:  [95 %-97 %] 97 % (11/10 0518) Weight:  [119.8 kg (264 lb 1.8 oz)] 264 lb 1.8 oz (119.8 kg) (11/10 0518)  Intake/Output from previous day: 11/09 0701 - 11/10 0700 In: 360 [P.O.:360] Out: 400 [Urine:400]   Physical Exam: General: Well developed, well nourished, in no acute distress. Head:  Normocephalic and atraumatic. Lungs: Clear to auscultation and percussion. Heart: Normal S1 and S2.  No murmur, rubs or gallops.  Pulses: S/P right radial harvest for CABG Extremities: No clubbing or cyanosis. No edema. Neurologic: Alert and oriented x 3.    Lab Results:  [facility] 10/30/11 0520 10/29/11 1742  WBC 8.2 10.0  HGB 13.7 13.9  PLT 190 176    Basename 11/01/11 0600 10/31/11 0500  NA 132* 137  K 4.2 4.7  CL 99 103  CO2 23 22  GLUCOSE 339* 248*  BUN 13 18  CREATININE 1.05 1.09    Basename 10/30/11 0004 10/29/11 1742  TROPONINI <0.30 <0.30   Hepatic Function Panel  Basename 10/29/11 1742  PROT 6.9  ALBUMIN 3.6  AST 24  ALT 21  ALKPHOS 65  BILITOT 1.7*  BILIDIR --  IBILI --    Basename 10/30/11 0520  CHOL 139   No results found for this basename: PROTIME in the last 72 hours  Imaging: No results found.    Assessment/Plan:  Patient Active Hospital Problem List: CAD, NATIVE VESSEL (08/02/2009)   Assessment: Stable.  No recurrent pain.   Plan: load with clopidogrel, and reassess P2Y12 on Sunday.  PCI protected LM on Monday HYPOTHYROIDISM (08/02/2009)   Assessment: see plans   Plan: see plans DIABETES MELLITUS (08/02/2009)   Assessment: higher glucoses   Plan: Have pharmacy adjust sliding scale  Have increased Lantus to 20 units at  night.     Charlton Haws, MD, [facility], FSCAI 11/01/2011, 8:10 AM

## 2011-11-02 DIAGNOSIS — I200 Unstable angina: Secondary | ICD-10-CM | POA: Diagnosis present

## 2011-11-02 LAB — GLUCOSE, CAPILLARY
Glucose-Capillary: 140 mg/dL — ABNORMAL HIGH (ref 70–99)
Glucose-Capillary: 303 mg/dL — ABNORMAL HIGH (ref 70–99)
Glucose-Capillary: 168 mg/dL — ABNORMAL HIGH (ref 70–99)
Glucose-Capillary: 71 mg/dL (ref 70–99)
Glucose-Capillary: 111 mg/dL — ABNORMAL HIGH (ref 70–99)

## 2011-11-02 MED ORDER — SODIUM CHLORIDE 0.9 % IV SOLN
INTRAVENOUS | Status: DC
  Administered 2011-11-02: 20:00:00 via INTRAVENOUS

## 2011-11-02 MED ORDER — CLOPIDOGREL BISULFATE 75 MG PO TABS
75.0000 mg | ORAL_TABLET | Freq: Every day | ORAL | Status: DC
  Administered 2011-11-02 – 2011-11-04 (×3): 75 mg via ORAL
  Filled 2011-11-02 (×4): qty 1

## 2011-11-02 MED ORDER — DIAZEPAM 5 MG PO TABS
5.0000 mg | ORAL_TABLET | ORAL | Status: AC
  Administered 2011-11-03: 5 mg via ORAL
  Filled 2011-11-02: qty 1

## 2011-11-02 NOTE — Progress Notes (Signed)
Subjective: Found patient relaxing in the Solar[employer]. No chest pain or dyspnea.   Objective: Temp:  [98 F (36.7 C)-99.2 F (37.3 C)] 98 F (36.7 C) (11/11 0641) Pulse Rate:  [69-80] 69  (11/11 0641) Resp:  [14-20] 16  (11/11 0641) BP: (123-165)/(64-84) 137/65 mmHg (11/11 0641) SpO2:  [96 %-98 %] 98 % (11/11 0641) Weight:  [264 lb 1.8 oz (119.8 kg)] 264 lb 1.8 oz (119.8 kg) (11/11 0641)  I/O last 3 completed shifts: In: 720 [P.O.:720] Out: -   Telemetry - Sinus rhythm.  Exam -   General - NAD.  Lungs - CTA, nonlabored.  Cardiac - RRR, no gallop.  Abdomen - NABS.  Extremities - No edema.  Testing -   Lab Results  Component Value Date   WBC 8.2 10/30/2011   HGB 13.7 10/30/2011   HCT 40.4 10/30/2011   MCV 90.0 10/30/2011   PLT 190 10/30/2011    Lab Results  Component Value Date   CREATININE 1.05 11/01/2011   BUN 13 11/01/2011   NA 132* 11/01/2011   K 4.2 11/01/2011   CL 99 11/01/2011   CO2 23 11/01/2011    Lab Results  Component Value Date   CKTOTAL 119 10/30/2011   CKMB 3.9 10/30/2011   TROPONINI <0.30 10/30/2011    Current Medications    . aspirin EC  81 mg Oral Daily  . docusate sodium  200 mg Oral Daily  . insulin aspart  1-7 Units Subcutaneous TID WC & HS  . insulin aspart  10 Units Subcutaneous TID WC  . insulin glargine  20 Units Subcutaneous QHS  . levothyroxine  200 mcg Oral Daily  . lisinopril  20 mg Oral Daily  . pantoprazole  80 mg Oral Daily  . rosuvastatin  20 mg Oral Daily     Assessment:  1. Unstable angina, currently without active symptoms. Cardiac markers negative.  2. Multivessel CAD s/p CABG 2009. Patent LIMA to LAD, patent radial to RCA. Occluded SVG's to diagonal and OM system. No ventriculogram performed.  3. Type 2 diabetes mellitus, not controlled, on SSI. Hgb A1C 7.3.  4. Hypothyroidism, TSH normal.  Plan:  As per Dr. Rosalyn Charters plan, patient is scheduled for left main PCI with Dr. Clifton James tomorrow. Platelet P2Y12  today is 156 after Plavix load on 11/9. I do not see that Plavix has been continued daily however - will start today. Check CBC, BMET AM. Patient requests shower today. Pre-cath orders done.

## 2011-11-03 ENCOUNTER — Ambulatory Visit (HOSPITAL_COMMUNITY): Admit: 2011-11-03 | Payer: Self-pay | Admitting: Cardiovascular Disease

## 2011-11-03 ENCOUNTER — Other Ambulatory Visit: Payer: Self-pay

## 2011-11-03 ENCOUNTER — Encounter (HOSPITAL_COMMUNITY)
Admission: RE | Disposition: A | Discharge status: Home / Self Care | Payer: Self-pay | Source: Other Acute Inpatient Hospital | Attending: Cardiology

## 2011-11-03 HISTORY — PX: PERCUTANEOUS CORONARY STENT INTERVENTION (PCI-S): SHX5485

## 2011-11-03 LAB — BASIC METABOLIC PANEL
Calcium: 9.1 mg/dL (ref 8.4–10.5)
GFR calc non Af Amer: 66 mL/min — ABNORMAL LOW (ref >90)
Sodium: 132 mEq/L — ABNORMAL LOW (ref 135–145)

## 2011-11-03 LAB — CBC
MCH: 30.8 pg (ref 26.0–34.0)
MCHC: 35.2 g/dL (ref 30.0–36.0)
Platelets: 176 10*3/uL (ref 150–400)
RBC: 4.65 MIL/uL (ref 4.22–5.81)

## 2011-11-03 SURGERY — PERCUTANEOUS CORONARY STENT INTERVENTION (PCI-S)
Anesthesia: LOCAL

## 2011-11-03 MED ORDER — NITROGLYCERIN 0.2 MG/ML ON CALL CATH LAB
INTRAVENOUS | Status: AC
  Filled 2011-11-03: qty 1

## 2011-11-03 MED ORDER — LIDOCAINE HCL (PF) 1 % IJ SOLN
INTRAMUSCULAR | Status: AC
  Filled 2011-11-03: qty 30

## 2011-11-03 MED ORDER — ASPIRIN 81 MG PO CHEW
CHEWABLE_TABLET | ORAL | Status: AC
  Filled 2011-11-03: qty 1

## 2011-11-03 MED ORDER — INSULIN ASPART 100 UNIT/ML ~~LOC~~ SOLN
4.0000 [IU] | Freq: Once | SUBCUTANEOUS | Status: AC
  Administered 2011-11-03: 4 [IU] via SUBCUTANEOUS
  Filled 2011-11-03: qty 3

## 2011-11-03 MED ORDER — FENTANYL CITRATE 0.05 MG/ML IJ SOLN
INTRAMUSCULAR | Status: AC
  Filled 2011-11-03: qty 2

## 2011-11-03 MED ORDER — HEPARIN (PORCINE) IN NACL 2-0.9 UNIT/ML-% IJ SOLN
INTRAMUSCULAR | Status: AC
  Filled 2011-11-03: qty 2000

## 2011-11-03 MED ORDER — MIDAZOLAM HCL 2 MG/2ML IJ SOLN
INTRAMUSCULAR | Status: AC
  Filled 2011-11-03: qty 2

## 2011-11-03 MED ORDER — SODIUM CHLORIDE 0.9 % IV SOLN
1.0000 mL/kg/h | INTRAVENOUS | Status: AC
  Administered 2011-11-03: 1 mL/kg/h via INTRAVENOUS

## 2011-11-03 NOTE — Progress Notes (Signed)
CBG at 0400 was 198.  At 0500, patient checked his own CBG with his personal meter and CBG was 244.  Patient received 1/2 of his usual Lantus dose last pm per pre-cath orders.  Dr. Margo Aye notified of the above.  Order received for one time dose of Novolog 4 units sub-q.  Will continue to monitor.  Alonza Bogus 11/03/11 5:17 AM

## 2011-11-03 NOTE — H&P (View-Only) (Signed)
Subjective: Found patient relaxing in the Solar[employer]. No chest pain or dyspnea.   Objective: Temp:  [98 F (36.7 C)-99.2 F (37.3 C)] 98 F (36.7 C) (11/11 0641) Pulse Rate:  [69-80] 69  (11/11 0641) Resp:  [14-20] 16  (11/11 0641) BP: (123-165)/(64-84) 137/65 mmHg (11/11 0641) SpO2:  [96 %-98 %] 98 % (11/11 0641) Weight:  [264 lb 1.8 oz (119.8 kg)] 264 lb 1.8 oz (119.8 kg) (11/11 0641)  I/O last 3 completed shifts: In: 720 [P.O.:720] Out: -   Telemetry - Sinus rhythm.  Exam -   General - NAD.  Lungs - CTA, nonlabored.  Cardiac - RRR, no gallop.  Abdomen - NABS.  Extremities - No edema.  Testing -   Lab Results  Component Value Date   WBC 8.2 10/30/2011   HGB 13.7 10/30/2011   HCT 40.4 10/30/2011   MCV 90.0 10/30/2011   PLT 190 10/30/2011    Lab Results  Component Value Date   CREATININE 1.05 11/01/2011   BUN 13 11/01/2011   NA 132* 11/01/2011   K 4.2 11/01/2011   CL 99 11/01/2011   CO2 23 11/01/2011    Lab Results  Component Value Date   CKTOTAL 119 10/30/2011   CKMB 3.9 10/30/2011   TROPONINI <0.30 10/30/2011    Current Medications    . aspirin EC  81 mg Oral Daily  . docusate sodium  200 mg Oral Daily  . insulin aspart  1-7 Units Subcutaneous TID WC & HS  . insulin aspart  10 Units Subcutaneous TID WC  . insulin glargine  20 Units Subcutaneous QHS  . levothyroxine  200 mcg Oral Daily  . lisinopril  20 mg Oral Daily  . pantoprazole  80 mg Oral Daily  . rosuvastatin  20 mg Oral Daily     Assessment:  1. Unstable angina, currently without active symptoms. Cardiac markers negative.  2. Multivessel CAD s/p CABG 2009. Patent LIMA to LAD, patent radial to RCA. Occluded SVG's to diagonal and OM system. No ventriculogram performed.  3. Type 2 diabetes mellitus, not controlled, on SSI. Hgb A1C 7.3.  4. Hypothyroidism, TSH normal.  Plan:  As per Dr. Stuckey's plan, patient is scheduled for left main PCI with Dr. McAlhany tomorrow. Platelet P2Y12  today is 156 after Plavix load on 11/9. I do not see that Plavix has been continued daily however - will start today. Check CBC, BMET AM. Patient requests shower today. Pre-cath orders done. 

## 2011-11-03 NOTE — Progress Notes (Signed)
11/03/11-2300- Hypoglycemic event. BG= 57 @ 2220. 15mg  carbohydrate snack given per protocol. Updated BG @ 2250= 75. Pt was asymtomatic. Dr. Terressa Koyanagi was notified and is aware of event. Carollee Herter

## 2011-11-03 NOTE — Interval H&P Note (Signed)
History and Physical Interval Note:   11/03/2011   7:39 AM   Jeffrey Shaw  has presented today for his PCI. His diagnostic cath was last week per Dr. Riley Kill. PCI of Left main, Circumflex this am.  The various methods of treatment have been discussed with the patient and family. After consideration of risks, benefits and other options for treatment, the patient has consented to  Procedure(s): PERCUTANEOUS CORONARY STENT INTERVENTION (PCI-S) as a surgical intervention .  The patients' history has been reviewed, patient examined, no change in status, stable for surgery.  I have reviewed the patients' chart and labs.  Questions were answered to the patient's satisfaction.     Krystiana Fornes  MD

## 2011-11-03 NOTE — Progress Notes (Signed)
Pt is a tx from [facility]  with increasing cp. S/p cardiac cath. Plan for d/c on plavix and asa when stable. CM called several pharmacies in [place], [place] and found that plavix generic will be 24.08 @ [facility]. Please make sure all other meds can be generic also. He usually uses [facility]. Pt did voice some concerns with the price of insulin-CM spoke to him about contacting his pcp to discuss possibility of changing to cheaper insulin.  No other needs assessed by CM at this time. Gala Lewandowsky

## 2011-11-03 NOTE — Progress Notes (Signed)
Right groin, level 0. Jeffrey Shaw

## 2011-11-03 NOTE — Op Note (Signed)
Cardiac Catheterization Operative Report  CHRSITOPHER WIK [id number] 11/12/20128:37 AM No primary provider on file.  Procedure Performed:  1. PTCA/DES x 1 Left main artery into the Circumflex.    Operator: Verne Carrow, MD  Indication: 60 yo WM with history of CAD s/p CABG admitted with chest pain on  10/29/11. Diagnostic cath per Dr. Riley Kill last week with ostial Circumflex lesion. I was asked to perform the PCI today. The patients renal function has been followed closely over the last four days. His creatinine today is 1.1.                 Procedure Details: The risks, benefits, complications, treatment options, and expected outcomes were discussed with the patient. The patient and/or family concurred with the proposed plan, giving informed consent. The patient was brought to the cath lab after IV hydration was begun and oral premedication was given. The patient was further sedated with Versed 2 mg IV x 1 and  fentanyl 25 mcg IV x 1. The right groin was prepped and draped in the usual manner. Using the modified Seldinger access technique, a 6 French sheath was placed in the right femoral artery. A XB 3.5 guiding catheter was used to engage the left main artery. A bolus of Angiomax was given and then a drip was started. He had been previously loaded with Plavix.  A Cougar IC wire was advanced down the Circumflex artery. A 2.5 x 12 mm balloon was used to dilate the Circumflex artery into the left main artery. A 3.5 x 12 mm Promus Element DES was deployed in the left main extending into the Circumflex artery. A 4.5 x 8 mm [place] balloon was used to dilate the stent x 1. There was an excellent result with excellent flow into both the LAD and the Circumflex. The LAD is protected by a LIMA graft. No immediate complications. Pt to holding area in stable condition.    Hemodynamic Findings: Central aortic pressure:114/70   Impression: 1. Successful PTCA/DES x 1 Left main artery into the proximal  Circumflex.   Recommendations: Dual antiplatelet therapy with ASA and Plavix. Continue other cardiac meds.        Complications:  None; patient tolerated the procedure well.

## 2011-11-04 ENCOUNTER — Encounter (HOSPITAL_COMMUNITY): Payer: Self-pay

## 2011-11-04 DIAGNOSIS — I200 Unstable angina: Secondary | ICD-10-CM

## 2011-11-04 LAB — BASIC METABOLIC PANEL
BUN: 16 mg/dL (ref 6–23)
Calcium: 9.9 mg/dL (ref 8.4–10.5)
Creatinine, Ser: 1.16 mg/dL (ref 0.50–1.35)
GFR calc Af Amer: 77 mL/min — ABNORMAL LOW (ref >90)
GFR calc non Af Amer: 66 mL/min — ABNORMAL LOW (ref >90)

## 2011-11-04 LAB — CBC
HCT: 43.1 % (ref 39.0–52.0)
MCHC: 34.6 g/dL (ref 30.0–36.0)
MCV: 88.1 fL (ref 78.0–100.0)
RDW: 12.4 % (ref 11.5–15.5)

## 2011-11-04 MED ORDER — NITROGLYCERIN 0.4 MG SL SUBL: 0.4000 mg | SUBLINGUAL_TABLET | SUBLINGUAL | Status: DC | PRN

## 2011-11-04 MED ORDER — CLOPIDOGREL BISULFATE 75 MG PO TABS: 75.0000 mg | ORAL_TABLET | Freq: Every day | ORAL | Status: AC

## 2011-11-04 NOTE — Discharge Planning (Cosign Needed)
Physician Discharge Summary  Patient ID: Jeffrey Shaw MRN: [id number] DOB/AGE: 61-Dec-1952 61 y.o.  Admit date: 10/29/2011 Discharge date: 11/04/2011  Primary Discharge Diagnosis: Unstable anginal pain Secondary Discharge Diagnosis: #1 history of bypass surgery  2. hypertension  3. hyperlipidemia 4. diabetes 5. Hypothyroidism  Significant Diagnostic Studies: Cardiac catheterization, LIMA arteriogram, SVG angiogram, precatheterization with PTCA and 3.5 x 12 mm probe was drug-eluting stent to the left main,   chest x-ray  Shaw Course: Jeffrey Shaw is a 60 year old male with a history of coronary artery disease. He went to Jeffrey Shaw with chest pain and was transferred to Jeffrey Shaw for further evaluation and treatment.  His cardiac enzymes were negative for MI. He was anticoagulated with heparin. His hemoglobin A1c was 7.3. A CT angiogram of the chest was performed at Jeffrey Shaw and was negative for PE or other significant abnormality. A followup two-view chest x-ray here showed no acute disease. He was taken to the cath lab on 10/30/2011. The left main had a 70% distal stenosis in a bifurcation lesion with severe involvement of the proximal circumflex. PCI of the LMCA into the CFX seems most appropriate. With slight elevation in Cr, contrast was limited. His sugar is elevated. Will recheck BMET in the am, and defer PCI until Monday.   The patient was held and hydrated. He had some borderline renal insufficiency and his labs were followed closely. 11/03/2011 it was felt his BUN/creatinine it stabilized. He was taken back to cath lab for percutaneous interventional neck correction on the left main. He had a drug-eluting stent inserted successfully into left main without complication.  On 11/04/2011 Jeffrey Shaw was seen by Jeffrey Shaw. His BUN/creatinine are stable and other labs are without significant abnormality. Jeffrey Shaw was also seen by cardiac rehabilitation and ambulate  him. He was seen by the diabetes nurses and some recommendations were made. On 11/04/2011, Jeffrey Shaw was considered stable for discharge in improved condition, to followup as an outpatient.   A message has been left with the evening office to arrange a followup. He is encouraged to stick to a low sodium diabetic diet. Other discharge instructions are per the discharge instruction sheet.  Discharge Exam: Blood pressure 137/70, pulse 72, temperature 98.3 F (36.8 C), temperature source Oral, resp. rate 20, height 6\' 4"  (1.93 m), weight 264 lb 8.8 oz (120 kg), SpO2 96.00%.   Labs:   Lab Results  Component Value Date   WBC 7.3 11/04/2011   HGB 14.9 11/04/2011   HCT 43.1 11/04/2011   MCV 88.1 11/04/2011   PLT 205 11/04/2011    Lab 11/03/11 0632 10/29/11 1742  NA 132* --  K 4.1 --  CL 101 --  CO2 19 --  BUN 18 --  CREATININE 1.16 --  CALCIUM 9.1 --  PROT -- 6.9  BILITOT -- 1.7*  ALKPHOS -- 65  ALT -- 21  AST -- 24  GLUCOSE 309* --   Lab Results  Component Value Date   CKTOTAL 119 10/30/2011   CKMB 3.9 10/30/2011   TROPONINI <0.30 10/30/2011    Lab Results  Component Value Date   CHOL 139 10/30/2011   CHOL  Value: 130        ATP III CLASSIFICATION:  <200     mg/dL   Desirable  147-829  mg/dL   Borderline High  >=562    mg/dL   High 12/25/863   CHOL  Value: 129        ATP III  CLASSIFICATION:  <200     mg/dL   Desirable  161-096  mg/dL   Borderline High  >=045    mg/dL   High 4/0/9811   Lab Results  Component Value Date   HDL 56 10/30/2011   HDL 40 08/22/4781   HDL 35* 08/26/6212   Lab Results  Component Value Date   LDLCALC 73 10/30/2011   LDLCALC  Value: 78        Total Cholesterol/HDL:CHD Risk Coronary Heart Disease Risk Table                     Men   Women  1/2 Average Risk   3.4   3.3 01/26/2008   LDLCALC  Value: 82        Total Cholesterol/HDL:CHD Risk Coronary Heart Disease Risk Table                     Men   Women  1/2 Average Risk   3.4   3.3 01/25/2008   Lab Results    Component Value Date   TRIG 48 10/30/2011   TRIG 58 01/26/2008   TRIG 61 01/25/2008   Lab Results  Component Value Date   CHOLHDL 2.5 10/30/2011   CHOLHDL 3.3 01/26/2008   CHOLHDL 3.7 01/25/2008   No results found for this basename: LDLDIRECT    Cath Final Conclusions: 1. Patent LIMA to the LAD  2. Patent radial artery to the RCA with some feint collaterals to the CFX  3. Occluded SVGs to the diagonal, and OM 1,2,and 3.  4. Normal LVEDP    FOLLOW UP PLANS AND APPOINTMENTS : the ofc will call Discharge Orders    Future Orders Please Complete By Expires   Diet Carb Modified      Increase activity slowly        Current Discharge Medication List    START taking these medications   Details  clopidogrel (PLAVIX) 75 MG tablet Take 1 tablet (75 mg total) by mouth daily with breakfast. Qty: 30 tablet, Refills: 11    nitroGLYCERIN (NITROSTAT) 0.4 MG SL tablet Place 1 tablet (0.4 mg total) under the tongue every 5 (five) minutes as needed for chest pain. Qty: 25 tablet, Refills: 3      CONTINUE these medications which have NOT CHANGED   Details  aspirin EC 81 MG tablet Take 81 mg by mouth daily.      esomeprazole (NEXIUM) 20 MG capsule Take 20 mg by mouth daily before breakfast.      insulin aspart (NOVOLOG) 100 UNIT/ML injection Inject 10-20 Units into the skin 3 (three) times daily before meals. Patient uses home sliding scale    insulin glargine (LANTUS) 100 UNIT/ML injection Inject 15 Units into the skin at bedtime.      levothyroxine (SYNTHROID, LEVOTHROID) 200 MCG tablet Take 200 mcg by mouth daily.      lisinopril (PRINIVIL,ZESTRIL) 20 MG tablet Take 20 mg by mouth daily.      simvastatin (ZOCOR) 80 MG tablet Take 80 mg by mouth at bedtime.           BRING ALL MEDICATIONS WITH YOU TO FOLLOW UP APPOINTMENTS  Time spent with patient to include physician time: 45 min Signed: Theodore Shaw 11/04/2011, 7:55 AM Co-Sign MD

## 2011-11-04 NOTE — Progress Notes (Signed)
SUBJECTIVE: No chest pain or SOB. NO events.   BP 137/70  Pulse 72  Temp(Src) 98.3 F (36.8 C) (Oral)  Resp 20  Ht 6\' 4"  (1.93 m)  Wt 264 lb 8.8 oz (120 kg)  BMI 32.20 kg/m2  SpO2 96%  Intake/Output Summary (Last 24 hours) at 11/04/11 0649 Last data filed at 11/03/11 1900  Gross per 24 hour  Intake    965 ml  Output      0 ml  Net    965 ml    PHYSICAL EXAM General: Well developed, well nourished, in no acute distress. Alert and oriented x 3.  Psych:  Good affect, responds appropriately Neck: No JVD. No masses noted.  Lungs: Clear bilaterally with no wheezes or rhonci noted.  Heart: RRR with no murmurs noted. Abdomen: Bowel sounds are present. Soft, non-tender.  Extremities: No lower extremity edema.   LABS: Basic Metabolic Panel:  Basename 11/03/11 0632  NA 132*  K 4.1  CL 101  CO2 19  GLUCOSE 309*  BUN 18  CREATININE 1.16  CALCIUM 9.1  MG --  PHOS --   CBC:  Basename 11/03/11 0632  WBC 7.8  NEUTROABS --  HGB 14.3  HCT 40.6  MCV 87.3  PLT 176    Current Meds:    . aspirin EC  81 mg Oral Daily  . clopidogrel  75 mg Oral Q breakfast  . diazepam  5 mg Oral On Call  . docusate sodium  200 mg Oral Daily  . fentaNYL      . heparin      . insulin aspart  1-7 Units Subcutaneous TID WC & HS  . insulin aspart  10 Units Subcutaneous TID WC  . insulin glargine  20 Units Subcutaneous QHS  . levothyroxine  200 mcg Oral Daily  . lisinopril  20 mg Oral Daily  . midazolam      . nitroGLYCERIN      . pantoprazole  80 mg Oral Daily  . rosuvastatin  20 mg Oral Daily     ASSESSMENT AND PLAN:  1. CAD: Pt admitted with chest pain, unstable angina. Cardiac cath per Dr. Riley Kill on 10/30/11. Pt found to have patent LIMA to LAD however there was a severe stenosis in the distal Left main and ostium of the Circumflex. I performed PCI of the LM/Circumflex yesterday. A drug eluting stent was placed in the left main extending into the Circumflex. He is doing well.   He  will need dual antiplatelet therapy with ASA and Plavix for at least one year. Continue home  statin. We will switch his PPI to Protonix. He has not been a beta blocker in the past. He had no evidence of ACS this admission. Will let his primary cardiologist Dr. Myrtis Ser discuss this at f/u.   2. Dispo: d/c home today if labs ok. F/U Dr. Myrtis Ser 2-3 weeks.     ,  11/13/20126:49 AM

## 2011-11-04 NOTE — Progress Notes (Signed)
[id number] Cardiac Rehab Pt. walked in hall independently, denies any problems. Completed discharge education with pt.He declines Outpt. CRP, not interested.

## 2011-11-28 ENCOUNTER — Encounter: Payer: Self-pay | Admitting: Cardiology

## 2011-11-30 ENCOUNTER — Encounter: Payer: Self-pay | Admitting: Cardiology

## 2011-11-30 DIAGNOSIS — I2510 Atherosclerotic heart disease of native coronary artery without angina pectoris: Secondary | ICD-10-CM | POA: Insufficient documentation

## 2011-12-01 ENCOUNTER — Encounter: Payer: Self-pay | Admitting: Cardiology

## 2011-12-01 ENCOUNTER — Ambulatory Visit (INDEPENDENT_AMBULATORY_CARE_PROVIDER_SITE_OTHER): Payer: Self-pay | Admitting: Cardiology

## 2011-12-01 VITALS — BP 112/69 | HR 57 | Ht 76.0 in | Wt 260.0 lb

## 2011-12-01 DIAGNOSIS — E119 Type 2 diabetes mellitus without complications: Secondary | ICD-10-CM

## 2011-12-01 DIAGNOSIS — I10 Essential (primary) hypertension: Secondary | ICD-10-CM

## 2011-12-01 DIAGNOSIS — I2510 Atherosclerotic heart disease of native coronary artery without angina pectoris: Secondary | ICD-10-CM

## 2011-12-01 NOTE — Patient Instructions (Signed)
Continue all current medications. Follow up in 3 months - see above 

## 2011-12-01 NOTE — Assessment & Plan Note (Signed)
The patient is stable after drug-eluting stent was placed in the distal left main continuing into a circumflex. His LAD was protected by his patent LIMA. Unfortunately several of his vein grafts are occluded. There is no evaluation of his LV function. I will consider a followup echo later. He has no signs of CHF. He's had normal LV function in the past. We will have to do everything we can to continue aggressive secondary prevention. His fasting lipids revealed good LDL control. This was done in the hospital. I would check them again as an outpatient later.

## 2011-12-01 NOTE — Progress Notes (Signed)
HPI  The patient is seen post hospitalization. I had seen him last in the office in January, 2012. He was then hospitalized in November, 2012. He had chest tightness and shortness of breath when walking. He did not have an MI. Catheterization revealed that his LIMA was patent to the LAD and his right radial graft was patent to the RCA. There were slight collaterals to the circumflex. SVG was occluded to the diagonal, OM 1, OM 2, and OM 3. The patient received a drug-eluting stent to the distal left main continuing into the circumflex. He has done well. He's not had any return of symptoms. At this time I do not see documentation of his ejection fraction. This will be reviewed.  As part of today's evaluation I have reviewed the extensive notes from the hospital. I have also reviewed my prior records and completely updated the new electronic medical record.    Allergies  Allergen Reactions  . Morphine And Related Nausea Only    Current Outpatient Prescriptions  Medication Sig Dispense Refill  . aspirin EC 81 MG tablet Take 81 mg by mouth daily.        . clopidogrel (PLAVIX) 75 MG tablet Take 1 tablet (75 mg total) by mouth daily with breakfast.  30 tablet  11  . insulin aspart (NOVOLOG) 100 UNIT/ML injection Inject into the skin 3 (three) times daily before meals. Patient uses home sliding scale      . insulin glargine (LANTUS) 100 UNIT/ML injection Inject 15 Units into the skin at bedtime.        Marland Kitchen levothyroxine (SYNTHROID, LEVOTHROID) 200 MCG tablet Take 200 mcg by mouth daily.        Marland Kitchen lisinopril (PRINIVIL,ZESTRIL) 10 MG tablet Take 10 mg by mouth daily.        . metoprolol tartrate (LOPRESSOR) 25 MG tablet Take 25 mg by mouth 2 (two) times daily.        . Multiple Vitamin (MULTIVITAMIN) tablet Take 1 tablet by mouth daily.        . nitroGLYCERIN (NITROSTAT) 0.4 MG SL tablet Place 1 tablet (0.4 mg total) under the tongue every 5 (five) minutes as needed for chest pain.  25 tablet  3  .  simvastatin (ZOCOR) 40 MG tablet Take 40 mg by mouth at bedtime.          History   Social History  . Marital Status: Married    Spouse Name: [facility]    Number of Children: N/A  . Years of Education: N/A   Occupational History  . SELF EMPLOYED    Social History Main Topics  . Smoking status: Former Smoker -- 0.8 packs/day for 6 years    Types: Cigarettes    Quit date: 12/22/1968  . Smokeless tobacco: Never Used  . Alcohol Use: 1.2 oz/week    2 Cans of beer per week  . Drug Use: No  . Sexually Active: Not on file   Other Topics Concern  . Not on file   Social History Narrative  . No narrative on file    Family History  Problem Relation Age of Onset  . Diabetes Maternal Aunt     Past Medical History  Diagnosis Date  . CAD (coronary artery disease)     CABG 01/2008 /  Catheterization, November, 2012, LIMA to the LAD patent,  70% distal left main stenosis involving the proximal circumflex, DES to the left main continuing into the circumflex  . HTN (hypertension)   .  Hyperlipemia   . Hypothyroid   . GERD (gastroesophageal reflux disease)   . Diabetes mellitus     Dr. Nonie Hoyer, [facility]  . Hx of CABG     2009, LIMA to LAD, right radial to PDA, SVG to diagonal, SVG to OM1    Past Surgical History  Procedure Date  . Coronary artery bypass graft 01/2008    X 6  . Carpal tunnel release     left hand  . Back surgery   . Lumbar disc surgery     removed  . Inguinal hernia repair     right  . Tonsillectomy ~ 1955  . Coronary angioplasty     ROS   Patient denies fever, chills, headache, sweats, rash, change in vision, change in hearing, chest pain, cough, nausea vomiting, urinary symptoms. All other systems are reviewed and are negative.  PHYSICAL EXAM  Patient is stable today. There is no jugulovenous distention. Lungs are clear. Respiratory effort is nonlabored. Cardiac exam reveals S1 and S2. There no clicks or significant murmurs. The abdomen is soft. There is no  peripheral edema. There are no musculoskeletal deformities. There are no skin rashes. The cath site is nicely healed.  Filed Vitals:   12/01/11 1055  BP: 112/69  Pulse: 57  Height: 6\' 4"  (1.93 m)  Weight: 260 lb (117.935 kg)    EKG  EKG is done today and reviewed by me. There is mild sinus bradycardia. The QRS is normal.  ASSESSMENT & PLAN

## 2011-12-01 NOTE — Assessment & Plan Note (Signed)
Ongoing aggressive treatment of his diabetes will be very important.

## 2011-12-01 NOTE — Assessment & Plan Note (Signed)
Blood pressures controlled. No change in therapy. 

## 2012-03-03 ENCOUNTER — Ambulatory Visit: Payer: Self-pay | Admitting: Cardiology

## 2012-03-11 ENCOUNTER — Ambulatory Visit: Payer: Self-pay | Admitting: Cardiology

## 2012-04-09 ENCOUNTER — Ambulatory Visit (INDEPENDENT_AMBULATORY_CARE_PROVIDER_SITE_OTHER): Payer: Self-pay | Admitting: Cardiology

## 2012-04-09 ENCOUNTER — Encounter: Payer: Self-pay | Admitting: Cardiology

## 2012-04-09 VITALS — BP 133/78 | HR 64 | Ht 76.0 in | Wt 266.0 lb

## 2012-04-09 DIAGNOSIS — E785 Hyperlipidemia, unspecified: Secondary | ICD-10-CM

## 2012-04-09 DIAGNOSIS — I2510 Atherosclerotic heart disease of native coronary artery without angina pectoris: Secondary | ICD-10-CM

## 2012-04-09 DIAGNOSIS — I10 Essential (primary) hypertension: Secondary | ICD-10-CM

## 2012-04-09 NOTE — Assessment & Plan Note (Signed)
His lipids are being followed by his primary physician and they are stable. No change in therapy. 6 month followup.

## 2012-04-09 NOTE — Assessment & Plan Note (Signed)
Blood pressures control. No change in therapy. 

## 2012-04-09 NOTE — Progress Notes (Signed)
HPI  Patient is seen today to followup coronary disease. I saw him last in December, 2012. He been hospitalized in November, 2012 he received a drug-eluting stent to the distal left main and into the circumflex. He's doing well. He is not having any of his symptoms.  Allergies  Allergen Reactions  . Morphine And Related Nausea Only    Current Outpatient Prescriptions  Medication Sig Dispense Refill  . aspirin EC 81 MG tablet Take 81 mg by mouth daily.        . clopidogrel (PLAVIX) 75 MG tablet Take 1 tablet (75 mg total) by mouth daily with breakfast.  30 tablet  11  . insulin aspart (NOVOLOG) 100 UNIT/ML injection Inject into the skin 3 (three) times daily before meals. Patient uses home sliding scale      . insulin glargine (LANTUS) 100 UNIT/ML injection Inject 15 Units into the skin at bedtime.        Marland Kitchen levothyroxine (SYNTHROID, LEVOTHROID) 200 MCG tablet Take 200 mcg by mouth daily.        Marland Kitchen lisinopril (PRINIVIL,ZESTRIL) 10 MG tablet Take 10 mg by mouth daily.        . metoprolol tartrate (LOPRESSOR) 25 MG tablet Take 12.5 mg by mouth 2 (two) times daily.       . Multiple Vitamin (MULTIVITAMIN) tablet Take 1 tablet by mouth daily.        . nitroGLYCERIN (NITROSTAT) 0.4 MG SL tablet Place 1 tablet (0.4 mg total) under the tongue every 5 (five) minutes as needed for chest pain.  25 tablet  3  . simvastatin (ZOCOR) 80 MG tablet Take 80 mg by mouth at bedtime.        History   Social History  . Marital Status: Married    Spouse Name: [facility]    Number of Children: N/A  . Years of Education: N/A   Occupational History  . SELF EMPLOYED    Social History Main Topics  . Smoking status: Former Smoker -- 0.8 packs/day for 6 years    Types: Cigarettes    Quit date: 12/22/1968  . Smokeless tobacco: Never Used  . Alcohol Use: 1.2 oz/week    2 Cans of beer per week  . Drug Use: No  . Sexually Active: Not on file   Other Topics Concern  . Not on file   Social History Narrative   . No narrative on file    Family History  Problem Relation Age of Onset  . Diabetes Maternal Aunt     Past Medical History  Diagnosis Date  . CAD (coronary artery disease)     CABG 01/2008 /  Catheterization, November, 2012, LIMA to the LAD patent,  70% distal left main stenosis involving the proximal circumflex, DES to the left main continuing into the circumflex  . HTN (hypertension)   . Hyperlipemia   . Hypothyroid   . GERD (gastroesophageal reflux disease)   . Diabetes mellitus     Dr. Nonie Hoyer, [facility]  . Hx of CABG     2009, LIMA to LAD, right radial to PDA, SVG to diagonal, SVG to OM1    Past Surgical History  Procedure Date  . Coronary artery bypass graft 01/2008    X 6  . Carpal tunnel release     left hand  . Back surgery   . Lumbar disc surgery     removed  . Inguinal hernia repair     right  . Tonsillectomy ~ 1955  .  Coronary angioplasty     ROS    Patient denies fever, chills, headache, sweats, rash, change in vision, change in hearing, chest pain, cough, nausea vomiting, urinary symptoms. All other systems are reviewed and are negative.  PHYSICAL EXAM   Patient is stable. He is oriented to person time and place. Affect is normal. Head is atraumatic. Lungs are clear. Respiratory effort is nonlabored. Cardiac exam reveals S1 and S2. There no clicks or significant murmurs. The abdomen is soft. There is no peripheral edema.  Filed Vitals:   04/09/12 1427  BP: 133/78  Pulse: 64  Height: 6\' 4"  (1.93 m)  Weight: 266 lb (120.657 kg)     ASSESSMENT & PLAN

## 2012-04-09 NOTE — Patient Instructions (Signed)
Continue all current medications. Your physician wants you to follow up in: 6 months.  You will receive a reminder letter in the mail one-two months in advance.  If you don't receive a letter, please call our office to schedule the follow up appointment   

## 2012-04-09 NOTE — Assessment & Plan Note (Signed)
Coronary disease is stable. No change in therapy. He will remain on aspirin and Plavix for at least one year. I would like to see him remain on Plavix indefinitely.

## 2013-12-13 ENCOUNTER — Telehealth: Payer: Self-pay | Admitting: *Deleted

## 2013-12-13 NOTE — Telephone Encounter (Signed)
Wants Nitrostat. Was ordered by Doctor in [facility]. Did see Myrtis Ser recently, does he need to write a new script?

## 2013-12-19 MED ORDER — NITROGLYCERIN 0.4 MG SL SUBL: 0.4000 mg | SUBLINGUAL_TABLET | SUBLINGUAL | Status: DC | PRN

## 2014-11-29 ENCOUNTER — Encounter (HOSPITAL_COMMUNITY): Payer: Self-pay | Admitting: Cardiology

## 2014-11-30 ENCOUNTER — Encounter (HOSPITAL_COMMUNITY): Payer: Self-pay | Admitting: Cardiovascular Disease

## 2016-02-20 ENCOUNTER — Encounter: Payer: Self-pay | Admitting: *Deleted

## 2016-02-21 ENCOUNTER — Ambulatory Visit (INDEPENDENT_AMBULATORY_CARE_PROVIDER_SITE_OTHER): Payer: Medicare Other | Admitting: Cardiovascular Disease

## 2016-02-21 ENCOUNTER — Encounter: Payer: Self-pay | Admitting: *Deleted

## 2016-02-21 ENCOUNTER — Encounter: Payer: Self-pay | Admitting: Cardiovascular Disease

## 2016-02-21 VITALS — BP 158/78 | HR 68 | Ht 76.0 in | Wt 261.0 lb

## 2016-02-21 DIAGNOSIS — I10 Essential (primary) hypertension: Secondary | ICD-10-CM | POA: Diagnosis not present

## 2016-02-21 DIAGNOSIS — Z01818 Encounter for other preprocedural examination: Secondary | ICD-10-CM

## 2016-02-21 DIAGNOSIS — Z951 Presence of aortocoronary bypass graft: Secondary | ICD-10-CM | POA: Diagnosis not present

## 2016-02-21 DIAGNOSIS — I25118 Atherosclerotic heart disease of native coronary artery with other forms of angina pectoris: Secondary | ICD-10-CM | POA: Diagnosis not present

## 2016-02-21 DIAGNOSIS — E785 Hyperlipidemia, unspecified: Secondary | ICD-10-CM

## 2016-02-21 NOTE — Patient Instructions (Signed)
Your physician has requested that you have a lexiscan myoview. For further information please visit [email or web address]. Please follow instruction sheet, as given. Office will contact with results via phone or letter.   Continue all current medications. Your physician wants you to follow up in:  1 year.  You will receive a reminder letter in the mail one-two months in advance.  If you don't receive a letter, please call our office to schedule the follow up appointment    

## 2016-02-21 NOTE — Progress Notes (Signed)
Patient ID: [name], male   DOB: [birth year], 65 y.o.   MRN: [id number]       CARDIOLOGY CONSULT NOTE  Patient ID: Jeffrey Shaw MRN: [id number] DOB/AGE: 07-02-1951 65 y.o.  Admit date: (Not on file) Primary Physician TAPPER,DAVID B, MD  Reason for Consultation: CAD/CABG, preop clearance  HPI: The patient is a 65 year old male who I am meeting for the first time. He is a former patient of Dr. Myrtis Ser last evaluated in April 2013. He has a history of coronary artery disease and CABG. Most recent coronary angiogram in 10/2011 demonstrated that his LIMA to the LAD was patent , patent right radial graft to the RCA, slight collaterals to the circumflex, with occlusion of the SVG to the diagonal, OM1, OM 2, and OM 3. He previously received a drug-eluting stent to the distal left main continuing into the circumflex.  The patient denies any symptoms of chest pain, palpitations, shortness of breath, lightheadedness, dizziness, leg swelling, orthopnea, PND, and syncope.  He owns [employer] in [place], a local gym. He checks his blood pressure daily and said it runs in the 130/59 range.   ECG performed in the office demonstrates sinus rhythm with PVCs, nonspecific T-wave abnormalities, and an old inferior MI. Next  He is being scheduled undergo left hip replacement surgery.   Allergies  Allergen Reactions  . Morphine And Related Nausea Only    Current Outpatient Prescriptions  Medication Sig Dispense Refill  . aspirin EC 81 MG tablet Take 81 mg by mouth daily.      . insulin aspart (NOVOLOG) 100 UNIT/ML injection Inject into the skin 3 (three) times daily before meals. Patient uses home sliding scale    . insulin glargine (LANTUS) 100 UNIT/ML injection Inject 15 Units into the skin at bedtime.      Marland Kitchen levothyroxine (SYNTHROID, LEVOTHROID) 200 MCG tablet Take 200 mcg by mouth daily.      Marland Kitchen lisinopril (PRINIVIL,ZESTRIL) 10 MG tablet Take 10 mg by mouth daily.      . metoprolol tartrate (LOPRESSOR)  25 MG tablet Take 12.5 mg by mouth 2 (two) times daily.     . Multiple Vitamin (MULTIVITAMIN) tablet Take 1 tablet by mouth daily.      . nitroGLYCERIN (NITROSTAT) 0.4 MG SL tablet Place 1 tablet (0.4 mg total) under the tongue every 5 (five) minutes as needed for chest pain. 25 tablet 0  . pravastatin (PRAVACHOL) 40 MG tablet Take 40 mg by mouth daily.     No current facility-administered medications for this visit.    Past Medical History  Diagnosis Date  . CAD (coronary artery disease)     CABG 01/2008 /  Catheterization, November, 2012, LIMA to the LAD patent,  70% distal left main stenosis involving the proximal circumflex, DES to the left main continuing into the circumflex  . HTN (hypertension)   . Hyperlipemia   . Hypothyroid   . GERD (gastroesophageal reflux disease)   . Diabetes mellitus     Dr. Nonie Hoyer, [facility]  . Hx of CABG     2009, LIMA to LAD, right radial to PDA, SVG to diagonal, SVG to OM1    Past Surgical History  Procedure Laterality Date  . Coronary artery bypass graft  01/2008    X 6  . Carpal tunnel release      left hand  . Back surgery    . Lumbar disc surgery      removed  . Inguinal hernia repair  right  . Tonsillectomy  ~ 1955  . Coronary angioplasty    . Left heart catheterization with coronary/graft angiogram N/A 10/30/2011    Procedure: LEFT HEART CATHETERIZATION WITH Isabel Caprice;  Surgeon: Herby Abraham, MD;  Location: [facility] CATH LAB;  Service: Cardiovascular;  Laterality: N/A;  . Percutaneous coronary stent intervention (pci-s) N/A 11/03/2011    Procedure: PERCUTANEOUS CORONARY STENT INTERVENTION (PCI-S);  Surgeon: Kathleene Hazel, MD;  Location: [facility] CATH LAB;  Service: Cardiovascular;  Laterality: N/A;    Social History   Social History  . Marital Status: Married    Spouse Name: SANDRA  . Number of Children: N/A  . Years of Education: N/A   Occupational History  . SELF EMPLOYED    Social History Main Topics  .  Smoking status: Former Smoker -- 0.80 packs/day for 6 years    Types: Cigarettes    Quit date: 12/22/1968  . Smokeless tobacco: Never Used  . Alcohol Use: 1.2 oz/week    2 Cans of beer per week  . Drug Use: No  . Sexual Activity: Not on file   Other Topics Concern  . Not on file   Social History Narrative     No family history of premature CAD in 1st degree relatives.  Prior to Admission medications   Medication Sig Start Date End Date Taking? Authorizing Provider  aspirin EC 81 MG tablet Take 81 mg by mouth daily.     Yes Historical Provider, MD  insulin aspart (NOVOLOG) 100 UNIT/ML injection Inject into the skin 3 (three) times daily before meals. Patient uses home sliding scale   Yes Historical Provider, MD  insulin glargine (LANTUS) 100 UNIT/ML injection Inject 15 Units into the skin at bedtime.     Yes Historical Provider, MD  levothyroxine (SYNTHROID, LEVOTHROID) 200 MCG tablet Take 200 mcg by mouth daily.     Yes Historical Provider, MD  lisinopril (PRINIVIL,ZESTRIL) 10 MG tablet Take 10 mg by mouth daily.     Yes Historical Provider, MD  metoprolol tartrate (LOPRESSOR) 25 MG tablet Take 12.5 mg by mouth 2 (two) times daily.    Yes Historical Provider, MD  Multiple Vitamin (MULTIVITAMIN) tablet Take 1 tablet by mouth daily.     Yes Historical Provider, MD  nitroGLYCERIN (NITROSTAT) 0.4 MG SL tablet Place 1 tablet (0.4 mg total) under the tongue every 5 (five) minutes as needed for chest pain. 12/19/13 02/21/16 Yes Luis Abed, MD  pravastatin (PRAVACHOL) 40 MG tablet Take 40 mg by mouth daily.   Yes Historical Provider, MD     Review of systems complete and found to be negative unless listed above in HPI     Physical exam Blood pressure 158/78, pulse 68, height  (1.93 m), weight 261 lb (118.389 kg), SpO2 98 %. General: NAD Neck: No JVD, no thyromegaly or thyroid nodule.  Lungs: Clear to auscultation bilaterally with normal respiratory effort. CV: Nondisplaced  PMI. Regular rate and rhythm, normal S1/S2, no S3/S4, no murmur.  No peripheral edema.  No carotid bruit.    Abdomen: Soft, nontender, no hepatosplenomegaly, no distention.  Skin: Intact without lesions or rashes.  Neurologic: Alert and oriented x 3.  Psych: Normal affect. Extremities: No clubbing or cyanosis.  HEENT: Normal.   ECG: Most recent ECG reviewed.  Labs:   Lab Results  Component Value Date   WBC 7.3 11/04/2011   HGB 14.9 11/04/2011   HCT 43.1 11/04/2011   MCV 88.1 11/04/2011   PLT 205 11/04/2011  No results for input(s): NA, K, CL, CO2, BUN, CREATININE, CALCIUM, PROT, BILITOT, ALKPHOS, ALT, AST, GLUCOSE in the last 168 hours.  Invalid input(s): LABALBU Lab Results  Component Value Date   CKTOTAL 119 10/30/2011   CKMB 3.9 10/30/2011   TROPONINI <0.30 10/30/2011    Lab Results  Component Value Date   CHOL 139 10/30/2011   CHOL  01/26/2008    130        ATP III CLASSIFICATION:  <200     mg/dL   Desirable  742-595  mg/dL   Borderline High  >=638    mg/dL   High   CHOL  75/64/3329    129        ATP III CLASSIFICATION:  <200     mg/dL   Desirable  518-841  mg/dL   Borderline High  >=660    mg/dL   High   Lab Results  Component Value Date   HDL 56 10/30/2011   HDL 40 01/26/2008   HDL 35* 01/25/2008   Lab Results  Component Value Date   LDLCALC 73 10/30/2011   LDLCALC  01/26/2008    78        Total Cholesterol/HDL:CHD Risk Coronary Heart Disease Risk Table                     Men   Women  1/2 Average Risk   3.4   3.3   LDLCALC  01/25/2008    82        Total Cholesterol/HDL:CHD Risk Coronary Heart Disease Risk Table                     Men   Women  1/2 Average Risk   3.4   3.3   Lab Results  Component Value Date   TRIG 48 10/30/2011   TRIG 58 01/26/2008   TRIG 61 01/25/2008   Lab Results  Component Value Date   CHOLHDL 2.5 10/30/2011   CHOLHDL 3.3 01/26/2008   CHOLHDL 3.7 01/25/2008   No results found for: LDLDIRECT        Studies: No results found.  ASSESSMENT AND PLAN:  1. CAD with CABG and left main stent: Stable ischemic heart disease. As he is being scheduled undergo left hip replacement surgery, I think it would be prudent to obtain a Lexiscan Cardiolite stress test to more accurately assess his preoperative risk. Continue aspirin, lisinopril, metoprolol, and pravastatin.  2. Essential HTN: Elevated today but usually normal as he checks it daily. Continue lisinopril and metoprolol at present doses.  3. Hyperlipidemia: Will obtain copy of lipids from PCP. Continue pravastatin 40 mg.  4. Preoperative risk stratification: As he is being scheduled undergo left hip replacement surgery, I think it would be prudent to obtain a Lexiscan Cardiolite stress test to more accurately assess his preoperative risk.   Dispo: f/u 1 year.   Signed: Prentice Docker, M.D., F.A.C.C.  02/21/2016, 1:38 PM

## 2016-02-28 ENCOUNTER — Encounter (HOSPITAL_COMMUNITY)
Admission: RE | Admit: 2016-02-28 | Discharge: 2016-02-28 | Disposition: A | Discharge status: Home / Self Care | Payer: Medicare Other | Source: Ambulatory Visit | Attending: Cardiovascular Disease | Admitting: Cardiovascular Disease

## 2016-02-28 ENCOUNTER — Inpatient Hospital Stay (HOSPITAL_COMMUNITY): Admission: RE | Admit: 2016-02-28 | Payer: Medicare Other | Source: Ambulatory Visit

## 2016-02-28 ENCOUNTER — Encounter (HOSPITAL_COMMUNITY): Payer: Self-pay

## 2016-02-28 DIAGNOSIS — I25118 Atherosclerotic heart disease of native coronary artery with other forms of angina pectoris: Secondary | ICD-10-CM | POA: Diagnosis not present

## 2016-02-28 DIAGNOSIS — Z951 Presence of aortocoronary bypass graft: Secondary | ICD-10-CM

## 2016-02-28 DIAGNOSIS — Z01818 Encounter for other preprocedural examination: Secondary | ICD-10-CM | POA: Diagnosis not present

## 2016-02-28 LAB — NM MYOCAR MULTI W/SPECT W/WALL MOTION / EF
CHL CUP NUCLEAR SRS: 4
CHL CUP RESTING HR STRESS: 57 {beats}/min
LV sys vol: 70 mL
LVDIAVOL: 142 mL (ref 62–150)
NUC STRESS TID: 1.17
Peak HR: 80 {beats}/min
RATE: 0.31
SDS: 0
SSS: 4

## 2016-02-28 MED ORDER — SODIUM CHLORIDE 0.9% FLUSH
INTRAVENOUS | Status: AC
  Administered 2016-02-28: 10 mL via INTRAVENOUS
  Filled 2016-02-28: qty 10

## 2016-02-28 MED ORDER — TECHNETIUM TC 99M SESTAMIBI GENERIC - CARDIOLITE
30.0000 | Freq: Once | INTRAVENOUS | Status: AC | PRN
  Administered 2016-02-28: 29 via INTRAVENOUS

## 2016-02-28 MED ORDER — SODIUM CHLORIDE 0.9% FLUSH
INTRAVENOUS | Status: AC
  Filled 2016-02-28: qty 20

## 2016-02-28 MED ORDER — TECHNETIUM TC 99M SESTAMIBI - CARDIOLITE
10.0000 | Freq: Once | INTRAVENOUS | Status: AC | PRN
  Administered 2016-02-28: 10.8 via INTRAVENOUS

## 2016-02-28 MED ORDER — REGADENOSON 0.4 MG/5ML IV SOLN
INTRAVENOUS | Status: AC
  Administered 2016-02-28: 0.4 mg via INTRAVENOUS
  Filled 2016-02-28: qty 5

## 2016-02-29 ENCOUNTER — Encounter: Payer: Self-pay | Admitting: *Deleted

## 2016-03-25 ENCOUNTER — Ambulatory Visit: Payer: Self-pay | Admitting: Physician Assistant

## 2016-03-25 NOTE — H&P (Signed)
TOTAL HIP ADMISSION H&P  Patient is admitted for left total hip arthroplasty.  Subjective:  Chief Complaint: left hip pain  HPI: Jeffrey Shaw, 64 y.o. male, has a history of pain and functional disability in the left hip(s) due to arthritis and patient has failed non-surgical conservative treatments for greater than 12 weeks to include NSAID's and/or analgesics, corticosteriod injections, weight reduction as appropriate and activity modification.  Onset of symptoms was gradual starting 1 years ago with gradually worsening course since that time.The patient noted no past surgery on the left hip(s).  Patient currently rates pain in the left hip at 8 out of 10 with activity. Patient has night pain, worsening of pain with activity and weight bearing, pain that interfers with activities of daily living and pain with passive range of motion. Patient has evidence of periarticular osteophytes and joint space narrowing by imaging studies. This condition presents safety issues increasing the risk of falls. There is no current active infection.  Patient Active Problem List   Diagnosis Date Noted  . CAD (coronary artery disease)   . Intermediate coronary syndrome (HCC) 11/02/2011  . CORONARY ARTERY BYPASS GRAFT, HX OF 01/17/2010  . HYPOTHYROIDISM 08/02/2009  . DIABETES MELLITUS 08/02/2009  . HYPERLIPIDEMIA-MIXED 08/02/2009  . Essential hypertension 08/02/2009   Past Medical History  Diagnosis Date  . CAD (coronary artery disease)     CABG 01/2008 /  Catheterization, November, 2012, LIMA to the LAD patent,  70% distal left main stenosis involving the proximal circumflex, DES to the left main continuing into the circumflex  . HTN (hypertension)   . Hyperlipemia   . Hypothyroid   . GERD (gastroesophageal reflux disease)   . Diabetes mellitus     Dr. Cantley, [facility]  . Hx of CABG     2009, LIMA to LAD, right radial to PDA, SVG to diagonal, SVG to OM1    Past Surgical History  Procedure Laterality  Date  . Coronary artery bypass graft  01/2008    X 6  . Carpal tunnel release      left hand  . Back surgery    . Lumbar disc surgery      removed  . Inguinal hernia repair      right  . Tonsillectomy  ~ 1955  . Coronary angioplasty    . Left heart catheterization with coronary/graft angiogram N/A 10/30/2011    Procedure: LEFT HEART CATHETERIZATION WITH CORONARY/GRAFT ANGIOGRAM;  Surgeon: Thomas D Stuckey, MD;  Location: [facility] CATH [facility];  Service: Cardiovascular;  Laterality: N/A;  . Percutaneous coronary stent intervention (pci-s) N/A 11/03/2011    Procedure: PERCUTANEOUS CORONARY STENT INTERVENTION (PCI-S);  Surgeon: Christopher D McAlhany, MD;  Location: [facility]ATH [facility];  Service: Cardiovascular;  Laterality: N/A;     (Not in a hospital admission) Allergies  Allergen Reactions  . Morphine And Related Nausea Only    Social History  Substance Use Topics  . Smoking status: Former Smoker -- 0.80 packs/day for 6 years    Types: Cigarettes    Quit date: 12/22/1968  . Smokeless tobacco: Never Used  . Alcohol Use: 1.2 oz/week    2 Cans of beer per week    Family History  Problem Relation Age of Onset  . Diabetes Maternal Aunt      Review of Systems  Musculoskeletal: Positive for joint pain.  All other systems reviewed and are negative.   Objective:  Physical Exam  Constitutional: He is oriented to person, place, and time. He appears well-developed   and well-nourished. No distress.  HENT:  Head: Normocephalic and atraumatic.  Nose: Nose normal.  Eyes: Conjunctivae and EOM are normal. Pupils are equal, round, and reactive to light.  Neck: Normal range of motion. Neck supple.  Cardiovascular: Normal rate, regular rhythm, normal heart sounds and intact distal pulses.   Respiratory: Effort normal and breath sounds normal. No respiratory distress. He has no wheezes.  GI: Soft. Bowel sounds are normal. He exhibits no distension. There is no tenderness.  Musculoskeletal:       Left  hip: He exhibits decreased range of motion and tenderness.  Lymphadenopathy:    He has no cervical adenopathy.  Neurological: He is alert and oriented to person, place, and time. No cranial nerve deficit.  Skin: Skin is warm and dry. No rash noted. No erythema.  Psychiatric: He has a normal mood and affect. His behavior is normal.    Vital signs in last 24 hours: @VSRANGES@  Labs:   Estimated body mass index is 31.78 kg/(m^2) as calculated from the following:   Height as of 02/21/16: 6' 4" (1.93 m).   Weight as of 02/21/16: 118.389 kg (261 lb).   Imaging Review Plain radiographs demonstrate moderate degenerative joint disease of the left hip(s). The bone quality appears to be good for age and reported activity level.  Assessment/Plan:  End stage arthritis, left hip(s)  The patient history, physical examination, clinical judgement of the provider and imaging studies are consistent with end stage degenerative joint disease of the left hip(s) and total hip arthroplasty is deemed medically necessary. The treatment options including medical management, injection therapy, arthroscopy and arthroplasty were discussed at length. The risks and benefits of total hip arthroplasty were presented and reviewed. The risks due to aseptic loosening, infection, stiffness, dislocation/subluxation,  thromboembolic complications and other imponderables were discussed.  The patient acknowledged the explanation, agreed to proceed with the plan and consent was signed. Patient is being admitted for inpatient treatment for surgery, pain control, PT, OT, prophylactic antibiotics, VTE prophylaxis, progressive ambulation and ADL's and discharge planning.The patient is planning to be discharged home with home health services 

## 2016-04-07 ENCOUNTER — Encounter (HOSPITAL_COMMUNITY)
Admission: RE | Admit: 2016-04-07 | Discharge: 2016-04-07 | Disposition: A | Discharge status: Home / Self Care | Payer: Medicare Other | Source: Ambulatory Visit | Attending: Orthopedic Surgery | Admitting: Orthopedic Surgery

## 2016-04-07 ENCOUNTER — Ambulatory Visit (HOSPITAL_COMMUNITY)
Admission: RE | Admit: 2016-04-07 | Discharge: 2016-04-07 | Disposition: A | Discharge status: Home / Self Care | Payer: Medicare Other | Source: Ambulatory Visit | Attending: Physician Assistant | Admitting: Physician Assistant

## 2016-04-07 ENCOUNTER — Encounter (HOSPITAL_COMMUNITY): Payer: Self-pay

## 2016-04-07 DIAGNOSIS — Z01818 Encounter for other preprocedural examination: Secondary | ICD-10-CM | POA: Insufficient documentation

## 2016-04-07 DIAGNOSIS — M1612 Unilateral primary osteoarthritis, left hip: Secondary | ICD-10-CM | POA: Insufficient documentation

## 2016-04-07 DIAGNOSIS — I10 Essential (primary) hypertension: Secondary | ICD-10-CM | POA: Diagnosis not present

## 2016-04-07 DIAGNOSIS — Z01812 Encounter for preprocedural laboratory examination: Secondary | ICD-10-CM | POA: Insufficient documentation

## 2016-04-07 DIAGNOSIS — Z7902 Long term (current) use of antithrombotics/antiplatelets: Secondary | ICD-10-CM | POA: Insufficient documentation

## 2016-04-07 DIAGNOSIS — Z7982 Long term (current) use of aspirin: Secondary | ICD-10-CM | POA: Insufficient documentation

## 2016-04-07 DIAGNOSIS — E119 Type 2 diabetes mellitus without complications: Secondary | ICD-10-CM | POA: Diagnosis not present

## 2016-04-07 DIAGNOSIS — Z79899 Other long term (current) drug therapy: Secondary | ICD-10-CM | POA: Insufficient documentation

## 2016-04-07 DIAGNOSIS — E782 Mixed hyperlipidemia: Secondary | ICD-10-CM | POA: Diagnosis not present

## 2016-04-07 DIAGNOSIS — Z794 Long term (current) use of insulin: Secondary | ICD-10-CM | POA: Insufficient documentation

## 2016-04-07 DIAGNOSIS — I2510 Atherosclerotic heart disease of native coronary artery without angina pectoris: Secondary | ICD-10-CM | POA: Insufficient documentation

## 2016-04-07 HISTORY — DX: Unspecified osteoarthritis, unspecified site: M19.90

## 2016-04-07 LAB — COMPREHENSIVE METABOLIC PANEL
ALK PHOS: 65 U/L (ref 38–126)
ALT: 23 U/L (ref 17–63)
AST: 23 U/L (ref 15–41)
Albumin: 3.9 g/dL (ref 3.5–5.0)
Anion gap: 9 (ref 5–15)
BILIRUBIN TOTAL: 1.5 mg/dL — AB (ref 0.3–1.2)
BUN: 13 mg/dL (ref 6–20)
CALCIUM: 9.5 mg/dL (ref 8.9–10.3)
CHLORIDE: 106 mmol/L (ref 101–111)
CO2: 20 mmol/L — AB (ref 22–32)
CREATININE: 1.02 mg/dL (ref 0.61–1.24)
GFR calc non Af Amer: >60 mL/min (ref >60)
GLUCOSE: 140 mg/dL — AB (ref 65–99)
Potassium: 4.3 mmol/L (ref 3.5–5.1)
Sodium: 135 mmol/L (ref 135–145)
Total Protein: 7 g/dL (ref 6.5–8.1)

## 2016-04-07 LAB — PROTIME-INR
INR: 1.01 (ref 0.00–1.49)
Prothrombin Time: 13.5 seconds (ref 11.6–15.2)

## 2016-04-07 LAB — URINALYSIS, ROUTINE W REFLEX MICROSCOPIC
Bilirubin Urine: NEGATIVE
GLUCOSE, UA: 500 mg/dL — AB
HGB URINE DIPSTICK: NEGATIVE
KETONES UR: NEGATIVE mg/dL
LEUKOCYTES UA: NEGATIVE
Nitrite: NEGATIVE
PROTEIN: NEGATIVE mg/dL
Specific Gravity, Urine: 1.012 (ref 1.005–1.030)
pH: 5.5 (ref 5.0–8.0)

## 2016-04-07 LAB — CBC WITH DIFFERENTIAL/PLATELET
Basophils Absolute: 0 10*3/uL (ref 0.0–0.1)
Basophils Relative: 1 %
Eosinophils Absolute: 0.2 10*3/uL (ref 0.0–0.7)
Eosinophils Relative: 3 %
HEMATOCRIT: 40.9 % (ref 39.0–52.0)
HEMOGLOBIN: 14.6 g/dL (ref 13.0–17.0)
LYMPHS ABS: 2.5 10*3/uL (ref 0.7–4.0)
LYMPHS PCT: 32 %
MCH: 31.7 pg (ref 26.0–34.0)
MCHC: 35.7 g/dL (ref 30.0–36.0)
MCV: 88.9 fL (ref 78.0–100.0)
MONOS PCT: 6 %
Monocytes Absolute: 0.5 10*3/uL (ref 0.1–1.0)
NEUTROS ABS: 4.6 10*3/uL (ref 1.7–7.7)
NEUTROS PCT: 58 %
Platelets: 236 10*3/uL (ref 150–400)
RBC: 4.6 MIL/uL (ref 4.22–5.81)
RDW: 12.7 % (ref 11.5–15.5)
WBC: 7.9 10*3/uL (ref 4.0–10.5)

## 2016-04-07 LAB — TYPE AND SCREEN
ABO/RH(D): O POS
Antibody Screen: NEGATIVE

## 2016-04-07 LAB — APTT: aPTT: 28 seconds (ref 24–37)

## 2016-04-07 LAB — SURGICAL PCR SCREEN
MRSA, PCR: NEGATIVE
Staphylococcus aureus: NEGATIVE

## 2016-04-07 LAB — GLUCOSE, CAPILLARY: Glucose-Capillary: 155 mg/dL — ABNORMAL HIGH (ref 65–99)

## 2016-04-07 MED ORDER — CHLORHEXIDINE GLUCONATE 4 % EX LIQD: 60.0000 mL | Freq: Once | CUTANEOUS | Status: DC

## 2016-04-07 NOTE — Pre-Procedure Instructions (Signed)
FAYSAL FENOGLIO  04/07/2016      [facility] [address] [place] - [address] [place] [facility] [address] [place] [address] Phone: [phone] Fax: [phone]    Your procedure is scheduled on 04/18/16.  Report to [facility] Admitting at 530 A.M.  Call this number if you have problems the morning of surgery:  [phone]   Remember:  Do not eat food or drink liquids after midnight.  Take these medicines the morning of surgery with A SIP OF WATER --hydrocodone,synthroid,metoprolol   Do not wear jewelry, make-up or nail polish.  Do not wear lotions, powders, or perfumes.  You may wear deodorant.  Do not shave 48 hours prior to surgery.  Men may shave face and neck.  Do not bring valuables to the hospital.  [facility] is not responsible for any belongings or valuables.  Contacts, dentures or bridgework may not be worn into surgery.  Leave your suitcase in the car.  After surgery it may be brought to your room.  For patients admitted to the hospital, discharge time will be determined by your treatment team.  Patients discharged the day of surgery will not be allowed to drive home.   Name and phone number of your driver:   Special instructions:    Please read over the following fact sheets that you were given. Pain Booklet, Coughing and Deep Breathing, Blood Transfusion Information, MRSA Information and Surgical Site Infection Prevention   How to Manage Your Diabetes Before and After Surgery  Why is it important to control my blood sugar before and after surgery? . Improving blood sugar levels before and after surgery helps healing and can limit problems. . A way of improving blood sugar control is eating a healthy diet by: o  Eating less sugar and carbohydrates o  Increasing activity/exercise o  Talking with your doctor about reaching your blood sugar goals . High blood sugars (greater than 180 mg/dL) can raise your risk of infections and slow your recovery, so  you will need to focus on controlling your diabetes during the weeks before surgery. . Make sure that the doctor who takes care of your diabetes knows about your planned surgery including the date and location.  How do I manage my blood sugar before surgery? . Check your blood sugar at least 4 times a day, starting 2 days before surgery, to make sure that the level is not too high or low. o Check your blood sugar the morning of your surgery when you wake up and every 2 hours until you get to the Short Stay unit. . If your blood sugar is less than 70 mg/dL, you will need to treat for low blood sugar: o Do not take insulin. o Treat a low blood sugar (less than 70 mg/dL) with  cup of clear juice (cranberry or apple), 4 glucose tablets, OR glucose gel. o Recheck blood sugar in 15 minutes after treatment (to make sure it is greater than 70 mg/dL). If your blood sugar is not greater than 70 mg/dL on recheck, call [phone] for further instructions. . Report your blood sugar to the short stay nurse when you get to Short Stay.  . If you are admitted to the hospital after surgery: o Your blood sugar will be checked by the staff and you will probably be given insulin after surgery (instead of oral diabetes medicines) to make sure you have good blood sugar levels. o The goal for blood  sugar control after surgery is 80-180 mg/dL.              WHAT DO I DO ABOUT MY DIABETES MEDICATION?   Marland Kitchen. Do not take oral diabetes medicines (pills) the morning of surgery.  . THE NIGHT BEFORE SURGERY, take ____20____ units of ____lantus_______insulin.       .   . The day of surgery, do not take other diabetes injectables, including Byetta (exenatide), Bydureon (exenatide ER), Victoza (liraglutide), or Trulicity (dulaglutide).  . If your CBG is greater than 220 mg/dL, you may take  of your sliding scale (correction) dose of insulin.  Other Instructions:          Patient  Signature:  Date:   Nurse Signature:  Date:   Reviewed and Endorsed by [facility] Patient Education Committee, August 2015

## 2016-04-08 LAB — URINE CULTURE: Culture: 8000 — AB

## 2016-04-08 LAB — HEMOGLOBIN A1C
HEMOGLOBIN A1C: 8.1 % — AB (ref 4.8–5.6)
Mean Plasma Glucose: 186 mg/dL

## 2016-04-08 NOTE — Progress Notes (Addendum)
Anesthesia Chart Review:  Pt is a 65 year old male scheduled for L total hip arthroplasty on 04/18/2016 with Dr. Madelon Lipsaffrey.   PCP is Dr. Wyvonnia Loraavid Tapper.   PMH includes:  CAD (s/p CABG 2009 (LIMA-LAD, R radial-PDA, SVG-diagonal, SVG-OM1); s/p DES to LM continuing into CX), HTN, DM, hyperlipidemia, GERD. Former smoker. BMI 33  Medications include: ASA, plavix, pepcid, novolog, lantus, levothyroxine, lisinopril, metoprolol, pravastatin. I spoke with pt- he took himself off of his plavix 2 weeks ago in anticipation of this surgery.   Preoperative labs reviewed.  HgbA1c 8.1, glucose 140.   Chest x-ray 04/07/16 reviewed. No active cardiopulmonary disease.   EKG 02/21/16: sinus rhythm with occasional PVCs. Old inferior MI. Nonspecific T wave abnormality  Nuclear stress test 02/28/16:   There was no ST segment deviation noted during stress.  The study is normal. There are no perfusion defects consistent with prior infarct or current ischemia.  This is a low risk study.  The left ventricular ejection fraction is mildly decreased (45-54%).  Cardiac cath 10/30/11:  1. Patent LIMA to the LAD 2. Patent radial artery to the RCA with some feint collaterals to the CFX 3. Occluded SVGs to the diagonal, and OM 1,2,and 3. 4. Normal LVEDP 5. LM with 70% distal stenosis resulting in bifurcation lesion with more severe involvement of proximal LCx 6. PCI of LM into the LCx was recommended and DES was placed 11/03/11  Pt saw Dr. Prentice DockerSuresh Koneswaran with cardiology for pre-op eval who ordered stress test (see above) which was normal.   If no changes, I anticipate pt can proceed with surgery as scheduled.   Rica Mastngela Dorla Guizar, FNP-BC [facility]iology Phone: [phone] 04/08/2016 4:39 PM

## 2016-04-17 MED ORDER — TRANEXAMIC ACID 1000 MG/10ML IV SOLN
1000.0000 mg | INTRAVENOUS | Status: AC
  Administered 2016-04-18: 1000 mg via INTRAVENOUS
  Filled 2016-04-17: qty 10

## 2016-04-17 MED ORDER — SODIUM CHLORIDE 0.9 % IV SOLN: INTRAVENOUS | Status: DC

## 2016-04-17 MED ORDER — DEXTROSE 5 % IV SOLN
3.0000 g | INTRAVENOUS | Status: AC
  Administered 2016-04-18: 3 g via INTRAVENOUS
  Filled 2016-04-17: qty 3000

## 2016-04-17 NOTE — Anesthesia Preprocedure Evaluation (Addendum)
Anesthesia Evaluation  Patient identified by MRN, date of birth, ID band Patient awake    Reviewed: Allergy & Precautions, NPO status , Patient's Chart, lab work & pertinent test results, reviewed documented beta blocker date and time   Airway Mallampati: II  TM Distance: >3 FB Neck ROM: Full    Dental  (+) Teeth Intact, Dental Advisory Given   Pulmonary former smoker,    breath sounds clear to auscultation       Cardiovascular hypertension, Pt. on medications and Pt. on home beta blockers + CAD, + Cardiac Stents and + CABG   Rhythm:Regular Rate:Normal     Neuro/Psych negative neurological ROS     GI/Hepatic Neg liver ROS, GERD  ,  Endo/Other  diabetes, Type 2, Insulin DependentHypothyroidism   Renal/GU negative Renal ROS     Musculoskeletal   Abdominal   Peds  Hematology negative hematology ROS (+)   Anesthesia Other Findings   Reproductive/Obstetrics                           Lab Results  Component Value Date   WBC 7.9 04/07/2016   HGB 14.6 04/07/2016   HCT 40.9 04/07/2016   MCV 88.9 04/07/2016   PLT 236 04/07/2016   Lab Results  Component Value Date   CREATININE 1.02 04/07/2016   BUN 13 04/07/2016   NA 135 04/07/2016   K 4.3 04/07/2016   CL 106 04/07/2016   CO2 20* 04/07/2016   Lab Results  Component Value Date   INR 1.01 04/07/2016   INR 1.05 10/29/2011   INR 1.2 01/27/2008    Anesthesia Physical Anesthesia Plan  ASA: III  Anesthesia Plan: Spinal and MAC   Post-op Pain Management:    Induction: Intravenous  Airway Management Planned: Natural Airway and Simple Face Mask  Additional Equipment:   Intra-op Plan:   Post-operative Plan:   Informed Consent: I have reviewed the patients History and Physical, chart, labs and discussed the procedure including the risks, benefits and alternatives for the proposed anesthesia with the patient or authorized  representative who has indicated his/her understanding and acceptance.     Plan Discussed with: CRNA  Anesthesia Plan Comments:        Anesthesia Quick Evaluation

## 2016-04-18 ENCOUNTER — Encounter (HOSPITAL_COMMUNITY): Payer: Self-pay | Admitting: Surgery

## 2016-04-18 ENCOUNTER — Inpatient Hospital Stay (HOSPITAL_COMMUNITY): Payer: Medicare Other

## 2016-04-18 ENCOUNTER — Inpatient Hospital Stay (HOSPITAL_COMMUNITY): Payer: Medicare Other | Admitting: Anesthesiology

## 2016-04-18 ENCOUNTER — Inpatient Hospital Stay (HOSPITAL_COMMUNITY)
Admission: RE | Admit: 2016-04-18 | Discharge: 2016-04-20 | DRG: 470 | Disposition: A | Discharge status: Home Health | Payer: Medicare Other | Source: Ambulatory Visit | Attending: Orthopedic Surgery | Admitting: Orthopedic Surgery

## 2016-04-18 ENCOUNTER — Encounter (HOSPITAL_COMMUNITY)
Admission: RE | Disposition: A | Discharge status: Home Health | Payer: Self-pay | Source: Ambulatory Visit | Attending: Orthopedic Surgery

## 2016-04-18 ENCOUNTER — Inpatient Hospital Stay (HOSPITAL_COMMUNITY): Payer: Medicare Other | Admitting: Emergency Medicine

## 2016-04-18 DIAGNOSIS — Z951 Presence of aortocoronary bypass graft: Secondary | ICD-10-CM

## 2016-04-18 DIAGNOSIS — K219 Gastro-esophageal reflux disease without esophagitis: Secondary | ICD-10-CM | POA: Diagnosis present

## 2016-04-18 DIAGNOSIS — E782 Mixed hyperlipidemia: Secondary | ICD-10-CM | POA: Diagnosis present

## 2016-04-18 DIAGNOSIS — Z7902 Long term (current) use of antithrombotics/antiplatelets: Secondary | ICD-10-CM | POA: Diagnosis not present

## 2016-04-18 DIAGNOSIS — I10 Essential (primary) hypertension: Secondary | ICD-10-CM | POA: Diagnosis present

## 2016-04-18 DIAGNOSIS — Z794 Long term (current) use of insulin: Secondary | ICD-10-CM | POA: Diagnosis not present

## 2016-04-18 DIAGNOSIS — Z833 Family history of diabetes mellitus: Secondary | ICD-10-CM | POA: Diagnosis not present

## 2016-04-18 DIAGNOSIS — Z955 Presence of coronary angioplasty implant and graft: Secondary | ICD-10-CM

## 2016-04-18 DIAGNOSIS — Z7982 Long term (current) use of aspirin: Secondary | ICD-10-CM

## 2016-04-18 DIAGNOSIS — M1612 Unilateral primary osteoarthritis, left hip: Secondary | ICD-10-CM | POA: Diagnosis present

## 2016-04-18 DIAGNOSIS — E039 Hypothyroidism, unspecified: Secondary | ICD-10-CM | POA: Diagnosis present

## 2016-04-18 DIAGNOSIS — I2510 Atherosclerotic heart disease of native coronary artery without angina pectoris: Secondary | ICD-10-CM | POA: Diagnosis present

## 2016-04-18 DIAGNOSIS — Z885 Allergy status to narcotic agent status: Secondary | ICD-10-CM

## 2016-04-18 DIAGNOSIS — Z87891 Personal history of nicotine dependence: Secondary | ICD-10-CM

## 2016-04-18 DIAGNOSIS — E119 Type 2 diabetes mellitus without complications: Secondary | ICD-10-CM | POA: Diagnosis present

## 2016-04-18 HISTORY — PX: TOTAL HIP ARTHROPLASTY: SHX124

## 2016-04-18 HISTORY — DX: Unilateral primary osteoarthritis, left hip: M16.12

## 2016-04-18 LAB — GLUCOSE, CAPILLARY
GLUCOSE-CAPILLARY: 255 mg/dL — AB (ref 65–99)
GLUCOSE-CAPILLARY: 308 mg/dL — AB (ref 65–99)
GLUCOSE-CAPILLARY: 293 mg/dL — AB (ref 65–99)
GLUCOSE-CAPILLARY: 302 mg/dL — AB (ref 65–99)
Glucose-Capillary: 245 mg/dL — ABNORMAL HIGH (ref 65–99)

## 2016-04-18 SURGERY — ARTHROPLASTY, HIP, TOTAL,POSTERIOR APPROACH
Anesthesia: Monitor Anesthesia Care | Site: Hip | Laterality: Left

## 2016-04-18 MED ORDER — MIDAZOLAM HCL 5 MG/5ML IJ SOLN
INTRAMUSCULAR | Status: DC | PRN
  Administered 2016-04-18: 2 mg via INTRAVENOUS

## 2016-04-18 MED ORDER — HYDROMORPHONE HCL 1 MG/ML IJ SOLN
INTRAMUSCULAR | Status: AC
  Filled 2016-04-18: qty 1

## 2016-04-18 MED ORDER — FENTANYL CITRATE (PF) 250 MCG/5ML IJ SOLN
INTRAMUSCULAR | Status: AC
  Filled 2016-04-18: qty 5

## 2016-04-18 MED ORDER — BISACODYL 10 MG RE SUPP: 10.0000 mg | Freq: Every day | RECTAL | Status: DC | PRN

## 2016-04-18 MED ORDER — LIDOCAINE HCL (CARDIAC) 20 MG/ML IV SOLN
INTRAVENOUS | Status: DC | PRN
  Administered 2016-04-18: 40 mg via INTRAVENOUS

## 2016-04-18 MED ORDER — LACTATED RINGERS IV SOLN
INTRAVENOUS | Status: DC | PRN
  Administered 2016-04-18 (×2): via INTRAVENOUS

## 2016-04-18 MED ORDER — TRANEXAMIC ACID 1000 MG/10ML IV SOLN
1000.0000 mg | Freq: Once | INTRAVENOUS | Status: AC
  Administered 2016-04-18: 1000 mg via INTRAVENOUS
  Filled 2016-04-18: qty 10

## 2016-04-18 MED ORDER — ONDANSETRON HCL 4 MG/2ML IJ SOLN: 4.0000 mg | Freq: Four times a day (QID) | INTRAMUSCULAR | Status: DC | PRN

## 2016-04-18 MED ORDER — CEFAZOLIN SODIUM-DEXTROSE 2-4 GM/100ML-% IV SOLN
2.0000 g | Freq: Four times a day (QID) | INTRAVENOUS | Status: AC
  Administered 2016-04-18 (×2): 2 g via INTRAVENOUS
  Filled 2016-04-18 (×2): qty 100

## 2016-04-18 MED ORDER — CLOPIDOGREL BISULFATE 75 MG PO TABS
75.0000 mg | ORAL_TABLET | Freq: Every day | ORAL | Status: DC
  Administered 2016-04-19: 75 mg via ORAL
  Filled 2016-04-18: qty 1

## 2016-04-18 MED ORDER — FLEET ENEMA 7-19 GM/118ML RE ENEM
1.0000 | ENEMA | Freq: Once | RECTAL | Status: DC | PRN
Start: 2016-04-18 — End: 2016-04-20

## 2016-04-18 MED ORDER — OXYCODONE-ACETAMINOPHEN 5-325 MG PO TABS: 1.0000 | ORAL_TABLET | ORAL | Status: DC | PRN

## 2016-04-18 MED ORDER — FAMOTIDINE 20 MG PO TABS
20.0000 mg | ORAL_TABLET | Freq: Two times a day (BID) | ORAL | Status: DC
  Administered 2016-04-18 – 2016-04-20 (×5): 20 mg via ORAL
  Filled 2016-04-18 (×5): qty 1

## 2016-04-18 MED ORDER — OXYCODONE HCL 5 MG PO TABS
ORAL_TABLET | ORAL | Status: AC
  Filled 2016-04-18: qty 2

## 2016-04-18 MED ORDER — OXYCODONE HCL 5 MG PO TABS
5.0000 mg | ORAL_TABLET | ORAL | Status: DC | PRN
  Administered 2016-04-18 – 2016-04-20 (×11): 10 mg via ORAL
  Filled 2016-04-18 (×10): qty 2

## 2016-04-18 MED ORDER — METOPROLOL TARTRATE 12.5 MG HALF TABLET
12.5000 mg | ORAL_TABLET | Freq: Two times a day (BID) | ORAL | Status: DC
  Administered 2016-04-18 – 2016-04-20 (×4): 12.5 mg via ORAL
  Filled 2016-04-18 (×4): qty 1

## 2016-04-18 MED ORDER — BUPIVACAINE HCL (PF) 0.5 % IJ SOLN
INTRAMUSCULAR | Status: DC | PRN
  Administered 2016-04-18: 3 mL via INTRATHECAL

## 2016-04-18 MED ORDER — MENTHOL 3 MG MT LOZG: 1.0000 | LOZENGE | OROMUCOSAL | Status: DC | PRN

## 2016-04-18 MED ORDER — INSULIN ASPART 100 UNIT/ML ~~LOC~~ SOLN
0.0000 [IU] | Freq: Every day | SUBCUTANEOUS | Status: DC
  Administered 2016-04-18: 4 [IU] via SUBCUTANEOUS
  Administered 2016-04-19: 3 [IU] via SUBCUTANEOUS

## 2016-04-18 MED ORDER — INSULIN ASPART 100 UNIT/ML ~~LOC~~ SOLN
SUBCUTANEOUS | Status: AC
  Filled 2016-04-18: qty 10

## 2016-04-18 MED ORDER — PHENYLEPHRINE HCL 10 MG/ML IJ SOLN
10.0000 mg | INTRAMUSCULAR | Status: DC | PRN
  Administered 2016-04-18: 25 ug/min via INTRAVENOUS

## 2016-04-18 MED ORDER — PRAVASTATIN SODIUM 40 MG PO TABS
40.0000 mg | ORAL_TABLET | Freq: Every day | ORAL | Status: DC
  Administered 2016-04-18 – 2016-04-19 (×2): 40 mg via ORAL
  Filled 2016-04-18 (×2): qty 1

## 2016-04-18 MED ORDER — ACETAMINOPHEN 325 MG PO TABS
650.0000 mg | ORAL_TABLET | Freq: Four times a day (QID) | ORAL | Status: DC | PRN
Start: 2016-04-18 — End: 2016-04-20

## 2016-04-18 MED ORDER — LEVOTHYROXINE SODIUM 100 MCG PO TABS
200.0000 ug | ORAL_TABLET | Freq: Every day | ORAL | Status: DC
  Administered 2016-04-19 – 2016-04-20 (×2): 200 ug via ORAL
  Filled 2016-04-18 (×2): qty 2

## 2016-04-18 MED ORDER — ONDANSETRON HCL 4 MG PO TABS: 4.0000 mg | ORAL_TABLET | Freq: Four times a day (QID) | ORAL | Status: DC | PRN

## 2016-04-18 MED ORDER — BUPIVACAINE-EPINEPHRINE (PF) 0.5% -1:200000 IJ SOLN
INTRAMUSCULAR | Status: AC
  Filled 2016-04-18: qty 30

## 2016-04-18 MED ORDER — HYDROCODONE-ACETAMINOPHEN 7.5-325 MG PO TABS: 1.0000 | ORAL_TABLET | Freq: Once | ORAL | Status: DC | PRN

## 2016-04-18 MED ORDER — PROPOFOL 10 MG/ML IV BOLUS
INTRAVENOUS | Status: AC
  Filled 2016-04-18: qty 20

## 2016-04-18 MED ORDER — DOCUSATE SODIUM 100 MG PO CAPS
100.0000 mg | ORAL_CAPSULE | Freq: Two times a day (BID) | ORAL | Status: DC
  Administered 2016-04-18 – 2016-04-20 (×5): 100 mg via ORAL
  Filled 2016-04-18 (×5): qty 1

## 2016-04-18 MED ORDER — LISINOPRIL 10 MG PO TABS
10.0000 mg | ORAL_TABLET | Freq: Every day | ORAL | Status: DC
  Administered 2016-04-19 – 2016-04-20 (×2): 10 mg via ORAL
  Filled 2016-04-18 (×2): qty 1

## 2016-04-18 MED ORDER — FENTANYL CITRATE (PF) 100 MCG/2ML IJ SOLN
INTRAMUSCULAR | Status: DC | PRN
Start: 2016-04-18 — End: 2016-04-18
  Administered 2016-04-18: 50 ug via INTRAVENOUS

## 2016-04-18 MED ORDER — INSULIN ASPART 100 UNIT/ML ~~LOC~~ SOLN
0.0000 [IU] | Freq: Three times a day (TID) | SUBCUTANEOUS | Status: DC
  Administered 2016-04-18: 3 [IU] via SUBCUTANEOUS
  Administered 2016-04-19 (×3): 15 [IU] via SUBCUTANEOUS
  Administered 2016-04-20: 20 [IU] via SUBCUTANEOUS

## 2016-04-18 MED ORDER — NITROGLYCERIN 0.4 MG SL SUBL: 0.4000 mg | SUBLINGUAL_TABLET | SUBLINGUAL | Status: DC | PRN

## 2016-04-18 MED ORDER — ASPIRIN EC 325 MG PO TBEC: 325.0000 mg | DELAYED_RELEASE_TABLET | Freq: Two times a day (BID) | ORAL | Status: DC

## 2016-04-18 MED ORDER — APIXABAN 2.5 MG PO TABS: 2.5000 mg | ORAL_TABLET | Freq: Two times a day (BID) | ORAL | Status: DC

## 2016-04-18 MED ORDER — METOCLOPRAMIDE HCL 5 MG/ML IJ SOLN: 5.0000 mg | Freq: Three times a day (TID) | INTRAMUSCULAR | Status: DC | PRN

## 2016-04-18 MED ORDER — ASPIRIN 325 MG PO TABS
325.0000 mg | ORAL_TABLET | Freq: Two times a day (BID) | ORAL | Status: DC
  Administered 2016-04-19 – 2016-04-20 (×3): 325 mg via ORAL
  Filled 2016-04-18 (×3): qty 1

## 2016-04-18 MED ORDER — PROPOFOL 500 MG/50ML IV EMUL
INTRAVENOUS | Status: DC | PRN
  Administered 2016-04-18: 75 ug/kg/min via INTRAVENOUS

## 2016-04-18 MED ORDER — PHENOL 1.4 % MT LIQD
1.0000 | OROMUCOSAL | Status: DC | PRN
Start: 2016-04-18 — End: 2016-04-20

## 2016-04-18 MED ORDER — ADULT MULTIVITAMIN W/MINERALS CH
1.0000 | ORAL_TABLET | Freq: Every day | ORAL | Status: DC
  Administered 2016-04-18 – 2016-04-20 (×3): 1 via ORAL
  Filled 2016-04-18 (×3): qty 1

## 2016-04-18 MED ORDER — SODIUM CHLORIDE 0.9 % IR SOLN
Status: DC | PRN
  Administered 2016-04-18: 1000 mL

## 2016-04-18 MED ORDER — HYDROMORPHONE HCL 1 MG/ML IJ SOLN
0.2500 mg | INTRAMUSCULAR | Status: DC | PRN
  Administered 2016-04-18: 1 mg via INTRAVENOUS

## 2016-04-18 MED ORDER — BUPIVACAINE-EPINEPHRINE 0.5% -1:200000 IJ SOLN
INTRAMUSCULAR | Status: DC | PRN
  Administered 2016-04-18: 30 mL

## 2016-04-18 MED ORDER — ACETAMINOPHEN 650 MG RE SUPP: 650.0000 mg | Freq: Four times a day (QID) | RECTAL | Status: DC | PRN

## 2016-04-18 MED ORDER — SODIUM CHLORIDE 0.9 % IV SOLN
INTRAVENOUS | Status: DC
Start: 2016-04-18 — End: 2016-04-20
  Administered 2016-04-18 – 2016-04-19 (×2): via INTRAVENOUS

## 2016-04-18 MED ORDER — HYDROMORPHONE HCL 1 MG/ML IJ SOLN
1.0000 mg | INTRAMUSCULAR | Status: DC | PRN
  Administered 2016-04-18: 1 mg via INTRAVENOUS
  Filled 2016-04-18: qty 1

## 2016-04-18 MED ORDER — METOCLOPRAMIDE HCL 5 MG PO TABS: 5.0000 mg | ORAL_TABLET | Freq: Three times a day (TID) | ORAL | Status: DC | PRN

## 2016-04-18 MED ORDER — MIDAZOLAM HCL 2 MG/2ML IJ SOLN
INTRAMUSCULAR | Status: AC
  Filled 2016-04-18: qty 2

## 2016-04-18 MED ORDER — INSULIN ASPART 100 UNIT/ML ~~LOC~~ SOLN
10.0000 [IU] | Freq: Once | SUBCUTANEOUS | Status: AC
  Administered 2016-04-18: 10 [IU] via SUBCUTANEOUS

## 2016-04-18 MED ORDER — INSULIN ASPART 100 UNIT/ML ~~LOC~~ SOLN
6.0000 [IU] | Freq: Three times a day (TID) | SUBCUTANEOUS | Status: DC
  Administered 2016-04-19 – 2016-04-20 (×4): 6 [IU] via SUBCUTANEOUS

## 2016-04-18 MED ORDER — POLYETHYLENE GLYCOL 3350 17 G PO PACK: 17.0000 g | PACK | Freq: Every day | ORAL | Status: DC | PRN

## 2016-04-18 SURGICAL SUPPLY — 60 items
BLADE SAW SAG 73X25 THK (BLADE) ×1
BLADE SAW SGTL 73X25 THK (BLADE) ×1 IMPLANT
BLADE SURG 10 STRL SS (BLADE) ×2 IMPLANT
BRUSH FEMORAL CANAL (MISCELLANEOUS) IMPLANT
CAPT HIP TOTAL 2 ×2 IMPLANT
COVER SURGICAL LIGHT HANDLE (MISCELLANEOUS) ×2 IMPLANT
DRAPE INCISE IOBAN 66X45 STRL (DRAPES) ×2 IMPLANT
DRAPE ORTHO SPLIT 77X108 STRL (DRAPES) ×2
DRAPE SURG ORHT 6 SPLT 77X108 (DRAPES) ×2 IMPLANT
DRAPE U-SHAPE 47X51 STRL (DRAPES) ×2 IMPLANT
DRSG ADAPTIC 3X8 NADH LF (GAUZE/BANDAGES/DRESSINGS) IMPLANT
DRSG PAD ABDOMINAL 8X10 ST (GAUZE/BANDAGES/DRESSINGS) ×4 IMPLANT
DURAPREP 26ML APPLICATOR (WOUND CARE) ×2 IMPLANT
ELECT BLADE 6.5 EXT (BLADE) IMPLANT
ELECT CAUTERY BLADE 6.4 (BLADE) ×2 IMPLANT
ELECT REM PT RETURN 9FT ADLT (ELECTROSURGICAL) ×2
ELECTRODE REM PT RTRN 9FT ADLT (ELECTROSURGICAL) ×1 IMPLANT
FACESHIELD WRAPAROUND (MASK) ×2 IMPLANT
GAUZE SPONGE 4X4 12PLY STRL (GAUZE/BANDAGES/DRESSINGS) IMPLANT
GAUZE XEROFORM 5X9 LF (GAUZE/BANDAGES/DRESSINGS) ×2 IMPLANT
GLOVE BIOGEL PI IND STRL 8 (GLOVE) ×2 IMPLANT
GLOVE BIOGEL PI INDICATOR 8 (GLOVE) ×2
GLOVE ORTHO TXT STRL SZ7.5 (GLOVE) ×2 IMPLANT
GLOVE SURG ORTHO 8.0 STRL STRW (GLOVE) ×2 IMPLANT
GLOVE SURG SS PI 8.5 STRL IVOR (GLOVE) ×1
GLOVE SURG SS PI 8.5 STRL STRW (GLOVE) ×1 IMPLANT
GOWN STRL REUS W/ TWL LRG LVL3 (GOWN DISPOSABLE) ×1 IMPLANT
GOWN STRL REUS W/ TWL XL LVL3 (GOWN DISPOSABLE) ×1 IMPLANT
GOWN STRL REUS W/TWL 2XL LVL3 (GOWN DISPOSABLE) ×2 IMPLANT
GOWN STRL REUS W/TWL LRG LVL3 (GOWN DISPOSABLE) ×1
GOWN STRL REUS W/TWL XL LVL3 (GOWN DISPOSABLE) ×1
HANDPIECE INTERPULSE COAX TIP (DISPOSABLE) ×1
HOOD PEEL AWAY FACE SHEILD DIS (HOOD) ×2 IMPLANT
IMMOBILIZER KNEE 22 UNIV (SOFTGOODS) ×2 IMPLANT
KIT BASIN OR (CUSTOM PROCEDURE TRAY) ×2 IMPLANT
KIT ROOM TURNOVER OR (KITS) ×2 IMPLANT
MANIFOLD NEPTUNE II (INSTRUMENTS) ×2 IMPLANT
NEEDLE 22X1 1/2 (OR ONLY) (NEEDLE) ×2 IMPLANT
NEEDLE MAYO TROCAR (NEEDLE) IMPLANT
NS IRRIG 1000ML POUR BTL (IV SOLUTION) ×2 IMPLANT
PACK TOTAL JOINT (CUSTOM PROCEDURE TRAY) ×2 IMPLANT
PACK UNIVERSAL I (CUSTOM PROCEDURE TRAY) IMPLANT
PAD ARMBOARD 7.5X6 YLW CONV (MISCELLANEOUS) ×4 IMPLANT
PRESSURIZER FEMORAL UNIV (MISCELLANEOUS) IMPLANT
SET HNDPC FAN SPRY TIP SCT (DISPOSABLE) ×1 IMPLANT
SPONGE GAUZE 4X4 12PLY STER LF (GAUZE/BANDAGES/DRESSINGS) ×2 IMPLANT
STAPLER VISISTAT 35W (STAPLE) ×4 IMPLANT
SUCTION FRAZIER HANDLE 10FR (MISCELLANEOUS) ×1
SUCTION TUBE FRAZIER 10FR DISP (MISCELLANEOUS) ×1 IMPLANT
SUT ETHIBOND 2 V 37 (SUTURE) ×2 IMPLANT
SUT VIC AB 0 CT1 27 (SUTURE) ×1
SUT VIC AB 0 CT1 27XBRD ANBCTR (SUTURE) ×1 IMPLANT
SUT VIC AB 2-0 CT1 27 (SUTURE) ×2
SUT VIC AB 2-0 CT1 TAPERPNT 27 (SUTURE) ×2 IMPLANT
SYR BULB IRRIGATION 50ML (SYRINGE) ×2 IMPLANT
SYR CONTROL 10ML LL (SYRINGE) ×2 IMPLANT
TOWEL OR 17X24 6PK STRL BLUE (TOWEL DISPOSABLE) ×2 IMPLANT
TOWEL OR 17X26 10 PK STRL BLUE (TOWEL DISPOSABLE) ×2 IMPLANT
TOWER CARTRIDGE SMART MIX (DISPOSABLE) IMPLANT
WATER STERILE IRR 1000ML POUR (IV SOLUTION) ×2 IMPLANT

## 2016-04-18 NOTE — Evaluation (Signed)
Occupational Therapy Evaluation Patient Details Name: Jeffrey Shaw MRN: [id number] DOB: Mar 15, 1951 Today's Date: 04/18/2016    History of Present Illness 65 yo admitted for Left posterior THA. PMhx: CAD, CABG, DM, hypothyroidism, HTN   Clinical Impression   Pt reports he was independent with ADLs PTA. Currently pt is overall min guard for functional mobility and ADLs with the exception of mod assist for LB ADLs. Pt able to recall 2/3 posterior hip precautions at start of session; reviewed all precautions. Pt planning to d/c home with 24/7 supervision from family. Pt would benefit from continued skilled OT to address established goals.    Follow Up Recommendations  No OT follow up;Supervision - Intermittent    Equipment Recommendations  None recommended by OT    Recommendations for Other Services       Precautions / Restrictions Precautions Precautions: Posterior Hip Precaution Booklet Issued: No Precaution Comments: Pt able recall 2/3 hip precautions. Reviewed all precautions with pt. Required Braces or Orthoses: Knee Immobilizer - Left Knee Immobilizer - Left: Other (comment) (no orders but has in room) Restrictions Weight Bearing Restrictions: Yes LLE Weight Bearing: Weight bearing as tolerated      Mobility Bed Mobility Overal bed mobility: Needs Assistance Bed Mobility: Supine to Sit     Supine to sit: Min guard     General bed mobility comments: Pt OOB in chair upon arrival.  Transfers Overall transfer level: Needs assistance Equipment used: Rolling walker (2 wheeled) Transfers: Sit to/from Stand Sit to Stand: Min guard         General transfer comment: Min guard for safety; no physical assist required. Increased time needed. Pt with good hand placement and technique.    Balance Overall balance assessment: Needs assistance Sitting-balance support: Feet supported;No upper extremity supported Sitting balance-Leahy Scale: Good     Standing balance  support: Bilateral upper extremity supported Standing balance-Leahy Scale: Poor Standing balance comment: RW for support                            ADL Overall ADL's : Needs assistance/impaired Eating/Feeding: Set up;Sitting   Grooming: Min guard;Standing   Upper Body Bathing: Set up;Supervision/ safety;Sitting   Lower Body Bathing: Moderate assistance;Sit to/from stand Lower Body Bathing Details (indicate cue type and reason): Educated pt on use of long handled sponge for increased independence with LB bathing. Upper Body Dressing : Supervision/safety;Set up;Sitting   Lower Body Dressing: Moderate assistance;Sit to/from stand Lower Body Dressing Details (indicate cue type and reason): Pt reports wife can assist with LB dressing as needed. Does not typically wear socks and has a reacher at home already. Toilet Transfer: Min guard;Ambulation;BSC;RW (BSC over toilet) Toilet Transfer Details (indicate cue type and reason): Simulated by sit <> stand from chair Toileting- Clothing Manipulation and Hygiene: Min guard;Sit to/from stand       Functional mobility during ADLs: Min guard;Rolling walker General ADL Comments: Educated pt sitting in shower for safety, 3 in 1 over toilet and in shower as a seat. KI present in room; pt reports he has been using it during functional mobility--KI applied in sitting prior to mobility but no order.     Vision     Perception     Praxis      Pertinent Vitals/Pain Pain Assessment: 0-10 Pain Score: 6  Pain Location: L hip with mobility Pain Descriptors / Indicators: Aching;Sore Pain Intervention(s): Limited activity within patient's tolerance;Monitored during session;Premedicated before session;Repositioned;Ice applied  Hand Dominance     Extremity/Trunk Assessment Upper Extremity Assessment Upper Extremity Assessment: Overall WFL for tasks assessed   Lower Extremity Assessment Lower Extremity Assessment: Defer to PT  evaluation LLE Deficits / Details: decreased strength and ROM as expected postop   Cervical / Trunk Assessment Cervical / Trunk Assessment: Normal   Communication Communication Communication: No difficulties   Cognition Arousal/Alertness: Awake/alert Behavior During Therapy: WFL for tasks assessed/performed Overall Cognitive Status: Within Functional Limits for tasks assessed       Memory: Decreased recall of precautions             General Comments       Exercises       Shoulder Instructions      Home Living Family/patient expects to be discharged to:: Private residence Living Arrangements: Spouse/significant other Available Help at Discharge: Available 24 hours/day;Family Type of Home: House Home Access: Stairs to enter [employer] of Steps: 3 Entrance Stairs-Rails: None Home Layout: One level     Bathroom Shower/Tub: Producer, television/film/video: Standard     Home Equipment: Bedside commode;Cane - single point;Walker - 2 wheels (bari walker)          Prior Functioning/Environment Level of Independence: Independent        Comments: owns a gym    OT Diagnosis: Generalized weakness;Acute pain   OT Problem List: Decreased strength;Decreased range of motion;Decreased activity tolerance;Impaired balance (sitting and/or standing);Decreased knowledge of use of DME or AE;Decreased knowledge of precautions;Pain   OT Treatment/Interventions: Self-care/ADL training;Energy conservation;DME and/or AE instruction;Therapeutic activities;Patient/family education;Balance training    OT Goals(Current goals can be found in the care plan section) Acute Rehab OT Goals Patient Stated Goal: get back to dancing OT Goal Formulation: With patient Time For Goal Achievement: 05/02/16 Potential to Achieve Goals: Good ADL Goals Pt Will Perform Lower Body Bathing: with supervision;with adaptive equipment;sit to/from stand Pt Will Perform Lower Body Dressing:  with supervision;with adaptive equipment;sit to/from stand Pt Will Transfer to Toilet: with supervision;ambulating;bedside commode (BSC over toilet) Pt Will Perform Toileting - Clothing Manipulation and hygiene: with supervision;sit to/from stand Pt Will Perform Tub/Shower Transfer: Shower transfer;ambulating;with supervision;3 in 1;rolling walker  OT Frequency: Min 2X/week   Barriers to D/C:            Co-evaluation              End of Session Equipment Utilized During Treatment: Gait belt;Rolling walker;Left knee immobilizer  Activity Tolerance: Patient tolerated treatment well Patient left: in chair;with call bell/phone within reach   Time: 1610-9604 OT Time Calculation (min): 17 min Charges:  OT General Charges $OT Visit: 1 Procedure OT Evaluation $OT Eval Moderate Complexity: 1 Procedure G-Codes:     Gaye Alken M.S., OTR/L Pager: [phone]  04/18/2016, 5:09 PM

## 2016-04-18 NOTE — Evaluation (Signed)
Physical Therapy Evaluation Patient Details Name: KELLAR WESTBERG MRN: [id number] DOB: 1951-08-09 Today's Date: 04/18/2016   History of Present Illness  65 yo admitted for Left posterior THA. PMhx: CAD, CABG, DM, hypothyroidism, HTN  Clinical Impression  Pt very pleasant and willing to work with therapy. 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 Pt with decreased strength, ROM, and gait. Pt would benefit from therapy to increase his mobility and independence, and decrease caregiver burden. Educated pt on HEP, precautions, transfers, and gait. Discussed equipment and follow up therapy recommendations with pt, and pt agreeable to recommendations listed below.    Follow Up Recommendations Home health PT    Equipment Recommendations  None recommended by PT    Recommendations for Other Services       Precautions / Restrictions Precautions Precautions: Posterior Hip Precaution Booklet Issued: Yes (comment) Restrictions Weight Bearing Restrictions: Yes LLE Weight Bearing: Weight bearing as tolerated      Mobility  Bed Mobility Overal bed mobility: Needs Assistance Bed Mobility: Supine to Sit     Supine to sit: Min guard     General bed mobility comments: pt hooks RLE under LLE to scoot legs across bed. cues and guard to maintain hip precautions throughout transfer.  Transfers Overall transfer level: Needs assistance Equipment used: None Transfers: Sit to/from Stand Sit to Stand: Min guard         General transfer comment: cues for hand and foot placement. guard to maintain hip precautions.  Ambulation/Gait Ambulation/Gait assistance: Min guard Ambulation Distance (Feet): 100 Feet Assistive device: Rolling walker (2 wheeled) Gait Pattern/deviations: Step-to pattern   Gait velocity interpretation: Below normal speed for age/gender General Gait Details: cues for posture, sequence, and maintaining hip precautions during  turns.  Stairs            Wheelchair Mobility    Modified Rankin (Stroke Patients Only)       Balance Overall balance assessment: No apparent balance deficits (not formally assessed)                                           Pertinent Vitals/Pain Pain Assessment: 0-10 Pain Score: 8  Pain Location: left hip Pain Descriptors / Indicators: Aching Pain Intervention(s): Limited activity within patient's tolerance;Monitored during session;Repositioned;Ice applied;Premedicated before session    Home Living Family/patient expects to be discharged to:: Private residence Living Arrangements: Spouse/significant other Available Help at Discharge: Available 24 hours/day;Family Type of Home: House Home Access: Stairs to enter Entrance Stairs-Rails: None Entrance Stairs-Number of Steps: 3 Home Layout: One level Home Equipment: Bedside commode;Cane - single point;Walker - 2 wheels (bariatric walker)      Prior Function Level of Independence: Independent               Hand Dominance        Extremity/Trunk Assessment   Upper Extremity Assessment: Defer to OT evaluation           Lower Extremity Assessment: LLE deficits/detail   LLE Deficits / Details: decreased strength and ROM as expected postop  Cervical / Trunk Assessment: Normal  Communication   Communication: No difficulties  Cognition Arousal/Alertness: Awake/alert Behavior During Therapy: WFL for tasks assessed/performed Overall Cognitive Status: Within Functional Limits for tasks assessed       Memory: Decreased recall of precautions  General Comments      Exercises Total Joint Exercises Heel Slides: AROM;Left;5 reps;Supine Hip ABduction/ADduction: AROM;Left;5 reps;Supine      Assessment/Plan    PT Assessment Patient needs continued PT services  PT Diagnosis Difficulty walking;Acute pain   PT Problem List Decreased strength;Decreased range of  motion;Decreased activity tolerance;Decreased mobility;Decreased knowledge of use of DME;Decreased knowledge of precautions;Pain  PT Treatment Interventions DME instruction;Gait training;Stair training;Functional mobility training;Therapeutic activities;Therapeutic exercise;Patient/family education   PT Goals (Current goals can be found in the Care Plan section) Acute Rehab PT Goals Patient Stated Goal: get back to dancing PT Goal Formulation: With patient Time For Goal Achievement: 05/02/16 Potential to Achieve Goals: Good    Frequency 7X/week   Barriers to discharge        Co-evaluation               End of Session Equipment Utilized During Treatment: Gait belt Activity Tolerance: Patient tolerated treatment well Patient left: in chair;with call bell/phone within reach Nurse Communication: Mobility status         Time: [id number] PT Time Calculation (min) (ACUTE ONLY): 32 min   Charges:   PT Evaluation $PT Eval Moderate Complexity: 1 Procedure PT Treatments $Therapeutic Activity: 8-22 mins   PT G CodesCherene Julian:        Eduarda Scrivens 04/18/2016, 2:22 PM   Cherene JulianPaola Orry Sigl, SPT [phone]

## 2016-04-18 NOTE — Interval H&P Note (Signed)
History and Physical Interval Note:  04/18/2016 7:42 AM  Jeffrey Shaw  has presented today for surgery, with the diagnosis of OA LEFT HIP  The various methods of treatment have been discussed with the patient and family. After consideration of risks, benefits and other options for treatment, the patient has consented to  Procedure(s): TOTAL HIP ARTHROPLASTY (Left) as a surgical intervention .  The patient's history has been reviewed, patient examined, no change in status, stable for surgery.  I have reviewed the patient's chart and labs.  Questions were answered to the patient's satisfaction.     Naarah Borgerding JR,W D

## 2016-04-18 NOTE — Brief Op Note (Signed)
04/18/2016  10:29 AM  PATIENT:  Jeffrey Shaw  65 y.o. male  PRE-OPERATIVE DIAGNOSIS:  OA LEFT HIP  POST-OPERATIVE DIAGNOSIS:  OA LEFT HIP  PROCEDURE:  Procedure(s): TOTAL HIP ARTHROPLASTY (Left)  SURGEON:  Surgeon(s) and Role:    * Frederico Hammananiel Caffrey, MD - Primary  PHYSICIAN ASSISTANT: Margart SicklesJoshua Jackelyne Sayer, PA-C  ASSISTANTS:   ANESTHESIA:   local, spinal and IV sedation  EBL:  Total I/O In: 1000 [I.V.:1000] Out: 300 [Blood:300]  BLOOD ADMINISTERED:none  DRAINS: none   LOCAL MEDICATIONS USED:  MARCAINE     SPECIMEN:  No Specimen  DISPOSITION OF SPECIMEN:  N/A  COUNTS:  YES  TOURNIQUET:  * No tourniquets in log *  DICTATION: .Other Dictation: Dictation Number unknown  PLAN OF CARE: Admit to inpatient   PATIENT DISPOSITION:  PACU - hemodynamically stable.   Delay start of Pharmacological VTE agent (>24hrs) due to surgical blood loss or risk of bleeding: yes

## 2016-04-18 NOTE — Op Note (Signed)
NAME:  Jeffrey Shaw, Jeffrey Shaw                 ACCOUNT NO.:  [id number]  MEDICAL RECORD NO.:  [id number]  LOCATION:  [facility]                         FACILITY:  [facility]  PHYSICIAN:  Dyke BrackettW. D. Surya Schroeter, M.D.    DATE OF BIRTH:  07/20/1951  DATE OF PROCEDURE:  04/18/2016 DATE OF DISCHARGE:                              OPERATIVE REPORT   PREOPERATIVE DIAGNOSIS:  Osteoarthritis, left hip.  POSTOPERATIVE DIAGNOSIS:  Osteoarthritis, left hip.  OPERATION:  Left total hip (AML 15 small Stature stem) with +5 mm neck length, 36 mm ceramic hip ball with acetabulum, 56 mm Sector Gription cup with 10 degree lip liner.  SURGEON:  Dyke BrackettW. D. Karna Abed, MD  ASSISTANVincent Peyer:  [place].  ANESTHESIA:  Spinal anesthetic.  BLOOD LOSS:  Approximately 400.  DESCRIPTION OF PROCEDURE:  Sterile prep and drape, lateral position, posterior approach to the hip made, splitting the iliotibial band, gluteus maximus fascia.  T capsulotomy was made in the hip, dislocated the hip, which showed significant wear, cut about 1 fingerbreadth above the lesser trochanter, progressively reamed up for 0.5 mm under reaming to a 14.5 mm reamer to accept a 50 mm small stature broach and then broached up to the appropriate size trial broach, left the broach in the canal.  Attention was next directed to the acetabulum.  Acetabulum was exposed, retracted anteriorly, inferiorly as well as 2 wing retractors, 1 superior, 1 posterosuperior.  Acetabulum was cleared of any remnants of the labrum.  We then reamed, we initially medialized down to the level of cancellous bone and acetabulum then reamed in approximately 10-15 degrees of anteversion, 50 degrees of abduction up to accept the 56 mm cup with 1 mm under reaming.  Trial cup made good resistance.  We then placed the final cup, elected not to use any screws as the purchase was excellent.  Then placed a trial lip liner, trialed off the broach, eventually settled with +5 mm neck length off the trial.  We  then exchanged the broach for the prosthesis, extremely tight fit was noted. We probably lacked about 2 mm of getting down to the level of the calcar with a collar.  However, excellent fit was obtained, again trialed off this and still elected to use the +5 mm neck length which was very stable, would only start to dislocate at 100 degrees flexion, full adduction, internal rotation past 65-70 degrees.  Final ceramic head was placed as well.  We then closed the T capsulotomy with #1 Ethibond, so the gluteus maximus fascia with the iliotibial band with a running Ethibond, subcutaneous tissues with 2- 0 Vicryl, skin with skin clips.  Lightly compressive sterile dressing, knee immobilizer applied, taken to recovery room in stable condition.     Dyke BrackettW. D. Prabhleen Montemayor, M.D.     WDC/MEDQ  D:  04/18/2016  T:  04/18/2016  Job:  [id number]

## 2016-04-18 NOTE — Anesthesia Procedure Notes (Signed)
Spinal Patient location during procedure: OR Staffing Anesthesiologist: Marcene DuosFITZGERALD, Balinda Heacock Performed by: anesthesiologist  Preanesthetic Checklist Completed: patient identified, site marked, surgical consent, pre-op evaluation, timeout performed, IV checked, risks and benefits discussed and monitors and equipment checked Spinal Block Patient position: sitting Prep: DuraPrep Patient monitoring: heart rate, continuous pulse ox and blood pressure Approach: midline Location: L3-4 Injection technique: single-shot Needle Needle type: Pencan  Needle gauge: 24 G Needle length: 9 cm

## 2016-04-18 NOTE — Anesthesia Postprocedure Evaluation (Signed)
Anesthesia Post Note  Patient: Hulan FessJerry L Suppa  Procedure(s) Performed: Procedure(s) (LRB): TOTAL HIP ARTHROPLASTY (Left)  Patient location during evaluation: PACU Anesthesia Type: Spinal Level of consciousness: oriented and awake and alert Pain management: pain level controlled Vital Signs Assessment: post-procedure vital signs reviewed and stable Respiratory status: spontaneous breathing, respiratory function stable and patient connected to nasal cannula oxygen Cardiovascular status: blood pressure returned to baseline and stable Postop Assessment: no headache, no backache and spinal receding Anesthetic complications: no    Last Vitals:  Filed Vitals:   04/18/16 1203 04/18/16 1313  BP:  98/60  Pulse:  53  Temp: 36.4 C 36.4 C  Resp:  18    Last Pain:  Filed Vitals:   04/18/16 1313  PainSc: 8                  Kennieth RadFitzgerald, Mataya Kilduff E

## 2016-04-18 NOTE — Progress Notes (Signed)
Inpatient Diabetes Program Recommendations  AACE/ADA: New Consensus Statement on Inpatient Glycemic Control (2015)  Target Ranges:  Prepandial:   less than 140 mg/dL      Peak postprandial:   less than 180 mg/dL (1-2 hours)      Critically ill patients:  140 - 180 mg/dL    Results for Jeffrey Shaw, Jeffrey Shaw (MRN [id number]) as of 04/18/2016 11:59  Ref. Range 04/18/2016 05:52 04/18/2016 10:40  Glucose-Capillary Latest Ref Range: 65-99 mg/dL 409255 (H) 811308 (H)    Admit with: TOTAL HIP ARTHROPLASTY (Left)  History: DM  Home DM Meds: Lantus 40 units QHS       Novolog 15-20 units tid per SSI  Current Insulin Orders: Novolog Resistant Correction Scale/ SSI (0-20 units) TID AC + HS      Novolog 6 units tidwc      MD- Note patient has order for Novolog Resistant Correction Scale/ SSI (0-20 units) TID AC + HS and Novolog 6 units tidwc to start after surgery today.  Please also consider starting at least 80% of home dose of Lantus-  Lantus 32 units QHS    --Will follow patient during hospitalization--  Ambrose FinlandJeannine Johnston Jermany Sundell RN, MSN, CDE Diabetes Coordinator Inpatient Glycemic Control Team Team Pager: [phone] (8a-5p)

## 2016-04-18 NOTE — H&P (View-Only) (Signed)
TOTAL HIP ADMISSION H&P  Patient is admitted for left total hip arthroplasty.  Subjective:  Chief Complaint: left hip pain  HPI: Jeffrey Shaw, 65 y.o. male, has a history of pain and functional disability in the left hip(s) due to arthritis and patient has failed non-surgical conservative treatments for greater than 12 weeks to include NSAID's and/or analgesics, corticosteriod injections, weight reduction as appropriate and activity modification.  Onset of symptoms was gradual starting 1 years ago with gradually worsening course since that time.The patient noted no past surgery on the left hip(s).  Patient currently rates pain in the left hip at 8 out of 10 with activity. Patient has night pain, worsening of pain with activity and weight bearing, pain that interfers with activities of daily living and pain with passive range of motion. Patient has evidence of periarticular osteophytes and joint space narrowing by imaging studies. This condition presents safety issues increasing the risk of falls. There is no current active infection.  Patient Active Problem List   Diagnosis Date Noted  . CAD (coronary artery disease)   . Intermediate coronary syndrome (HCC) 11/02/2011  . CORONARY ARTERY BYPASS GRAFT, HX OF 01/17/2010  . HYPOTHYROIDISM 08/02/2009  . DIABETES MELLITUS 08/02/2009  . HYPERLIPIDEMIA-MIXED 08/02/2009  . Essential hypertension 08/02/2009   Past Medical History  Diagnosis Date  . CAD (coronary artery disease)     CABG 01/2008 /  Catheterization, November, 2012, LIMA to the LAD patent,  70% distal left main stenosis involving the proximal circumflex, DES to the left main continuing into the circumflex  . HTN (hypertension)   . Hyperlipemia   . Hypothyroid   . GERD (gastroesophageal reflux disease)   . Diabetes mellitus     Dr. Nonie Hoyer, [facility]  . Hx of CABG     2009, LIMA to LAD, right radial to PDA, SVG to diagonal, SVG to OM1    Past Surgical History  Procedure Laterality  Date  . Coronary artery bypass graft  01/2008    X 6  . Carpal tunnel release      left hand  . Back surgery    . Lumbar disc surgery      removed  . Inguinal hernia repair      right  . Tonsillectomy  ~ 1955  . Coronary angioplasty    . Left heart catheterization with coronary/graft angiogram N/A 10/30/2011    Procedure: LEFT HEART CATHETERIZATION WITH Isabel Caprice;  Surgeon: Herby Abraham, MD;  Location: [facility] CATH LAB;  Service: Cardiovascular;  Laterality: N/A;  . Percutaneous coronary stent intervention (pci-s) N/A 11/03/2011    Procedure: PERCUTANEOUS CORONARY STENT INTERVENTION (PCI-S);  Surgeon: Kathleene Hazel, MD;  Location: [facility] CATH LAB;  Service: Cardiovascular;  Laterality: N/A;     (Not in a hospital admission) Allergies  Allergen Reactions  . Morphine And Related Nausea Only    Social History  Substance Use Topics  . Smoking status: Former Smoker -- 0.80 packs/day for 6 years    Types: Cigarettes    Quit date: 12/22/1968  . Smokeless tobacco: Never Used  . Alcohol Use: 1.2 oz/week    2 Cans of beer per week    Family History  Problem Relation Age of Onset  . Diabetes Maternal Aunt      Review of Systems  Musculoskeletal: Positive for joint pain.  All other systems reviewed and are negative.   Objective:  Physical Exam  Constitutional: He is oriented to person, place, and time. He appears well-developed  and well-nourished. No distress.  HENT:  Head: Normocephalic and atraumatic.  Nose: Nose normal.  Eyes: Conjunctivae and EOM are normal. Pupils are equal, round, and reactive to light.  Neck: Normal range of motion. Neck supple.  Cardiovascular: Normal rate, regular rhythm, normal heart sounds and intact distal pulses.   Respiratory: Effort normal and breath sounds normal. No respiratory distress. He has no wheezes.  GI: Soft. Bowel sounds are normal. He exhibits no distension. There is no tenderness.  Musculoskeletal:       Left  hip: He exhibits decreased range of motion and tenderness.  Lymphadenopathy:    He has no cervical adenopathy.  Neurological: He is alert and oriented to person, place, and time. No cranial nerve deficit.  Skin: Skin is warm and dry. No rash noted. No erythema.  Psychiatric: He has a normal mood and affect. His behavior is normal.    Vital signs in last 24 hours: @VSRANGES @  Labs:   Estimated body mass index is 31.78 kg/(m^2) as calculated from the following:   Height as of 02/21/16: 6\' 4"  (1.93 m).   Weight as of 02/21/16: 118.389 kg (261 lb).   Imaging Review Plain radiographs demonstrate moderate degenerative joint disease of the left hip(s). The bone quality appears to be good for age and reported activity level.  Assessment/Plan:  End stage arthritis, left hip(s)  The patient history, physical examination, clinical judgement of the provider and imaging studies are consistent with end stage degenerative joint disease of the left hip(s) and total hip arthroplasty is deemed medically necessary. The treatment options including medical management, injection therapy, arthroscopy and arthroplasty were discussed at length. The risks and benefits of total hip arthroplasty were presented and reviewed. The risks due to aseptic loosening, infection, stiffness, dislocation/subluxation,  thromboembolic complications and other imponderables were discussed.  The patient acknowledged the explanation, agreed to proceed with the plan and consent was signed. Patient is being admitted for inpatient treatment for surgery, pain control, PT, OT, prophylactic antibiotics, VTE prophylaxis, progressive ambulation and ADL's and discharge planning.The patient is planning to be discharged home with home health services

## 2016-04-18 NOTE — Transfer of Care (Signed)
Immediate Anesthesia Transfer of Care Note  Patient: Jeffrey Shaw  Procedure(s) Performed: Procedure(s): TOTAL HIP ARTHROPLASTY (Left)  Patient Location: PACU  Anesthesia Type:Spinal  Level of Consciousness: awake, alert  and oriented  Airway & Oxygen Therapy: Patient Spontanous Breathing  Post-op Assessment: Report given to RN and Post -op Vital signs reviewed and stable  Post vital signs: Reviewed and stable  Last Vitals:  Filed Vitals:   04/18/16 0546 04/18/16 0547  BP:  137/75  Pulse: 62   Temp: 36.8 C   Resp: 20     Last Pain: There were no vitals filed for this visit.       Complications: No apparent anesthesia complications

## 2016-04-18 NOTE — Progress Notes (Signed)
Utilization review completed.  

## 2016-04-19 LAB — CBC
HCT: 35.6 % — ABNORMAL LOW (ref 39.0–52.0)
Hemoglobin: 11.9 g/dL — ABNORMAL LOW (ref 13.0–17.0)
MCH: 30.4 pg (ref 26.0–34.0)
MCHC: 33.4 g/dL (ref 30.0–36.0)
MCV: 91 fL (ref 78.0–100.0)
Platelets: 179 K/uL (ref 150–400)
RBC: 3.91 MIL/uL — ABNORMAL LOW (ref 4.22–5.81)
RDW: 12.7 % (ref 11.5–15.5)
WBC: 9 K/uL (ref 4.0–10.5)

## 2016-04-19 LAB — BASIC METABOLIC PANEL WITH GFR
Anion gap: 9 (ref 5–15)
BUN: 20 mg/dL (ref 6–20)
CO2: 20 mmol/L — ABNORMAL LOW (ref 22–32)
Calcium: 8.8 mg/dL — ABNORMAL LOW (ref 8.9–10.3)
Chloride: 103 mmol/L (ref 101–111)
Creatinine, Ser: 1.18 mg/dL (ref 0.61–1.24)
GFR calc Af Amer: >60 mL/min
GFR calc non Af Amer: >60 mL/min
Glucose, Bld: 312 mg/dL — ABNORMAL HIGH (ref 65–99)
Potassium: 4.8 mmol/L (ref 3.5–5.1)
Sodium: 132 mmol/L — ABNORMAL LOW (ref 135–145)

## 2016-04-19 LAB — GLUCOSE, CAPILLARY
Glucose-Capillary: 328 mg/dL — ABNORMAL HIGH (ref 65–99)
Glucose-Capillary: 307 mg/dL — ABNORMAL HIGH (ref 65–99)
Glucose-Capillary: 330 mg/dL — ABNORMAL HIGH (ref 65–99)
Glucose-Capillary: 284 mg/dL — ABNORMAL HIGH (ref 65–99)

## 2016-04-19 NOTE — Progress Notes (Signed)
Physical Therapy Treatment Patient Details Name: Jeffrey Shaw MRN: [id number] DOB: Nov 15, 1951 Today's Date: 04/19/2016    History of Present Illness 65 yo admitted for Left posterior THA. PMhx: CAD, CABG, DM, hypothyroidism, HTN    PT Comments    Pt with continued progression with HEP, maintaining gait distance but more easily utilizing step-through pattern. Pt continues to be unable to state all 3 precautions omitting hip flexion. Will continue to follow acutely.   Follow Up Recommendations  Home health PT     Equipment Recommendations       Recommendations for Other Services       Precautions / Restrictions Precautions Precautions: Posterior Hip Precaution Comments: Pt able recall 2/3 hip precautions. Reviewed all precautions with pt. Restrictions LLE Weight Bearing: Weight bearing as tolerated    Mobility  Bed Mobility               General bed mobility comments: in chair on arrival and end of session  Transfers Overall transfer level: Needs assistance Equipment used: Rolling walker (2 wheeled) Transfers: Sit to/from BJ'sStand;Stand Pivot Transfers Sit to Stand: Supervision Stand pivot transfers: Supervision       General transfer comment: cues to maintain left foot in front with transfers  Ambulation/Gait Ambulation/Gait assistance: Supervision Ambulation Distance (Feet): 300 Feet Assistive device: Rolling walker (2 wheeled) Gait Pattern/deviations: Step-through pattern;Decreased stride length   Gait velocity interpretation: Below normal speed for age/gender General Gait Details: cues for posture    Stairs            Wheelchair Mobility    Modified Rankin (Stroke Patients Only)       Balance                                    Cognition Arousal/Alertness: Awake/alert Behavior During Therapy: WFL for tasks assessed/performed Overall Cognitive Status: Within Functional Limits for tasks assessed       Memory: Decreased  recall of precautions              Exercises Total Joint Exercises Hip ABduction/ADduction: AROM;Left;Standing;15 reps Long Arc Quad: AROM;Seated;Left;15 reps Knee Flexion: AROM;Standing;Left;15 reps Marching in Standing: AROM;Standing;Left;15 reps Standing Hip Extension: AROM;Standing;Left;15 reps    General Comments        Pertinent Vitals/Pain Pain Assessment: No/denies pain Pain Score: 4  Pain Location: left thigh Pain Descriptors / Indicators: Aching Pain Intervention(s): Limited activity within patient's tolerance;RN gave pain meds during session;Monitored during session;Repositioned;Ice applied    Home Living                      Prior Function            PT Goals (current goals can now be found in the care plan section) Progress towards PT goals: Progressing toward goals    Frequency       PT Plan Current plan remains appropriate    Co-evaluation             End of Session   Activity Tolerance: Patient tolerated treatment well Patient left: in chair;with call bell/phone within reach;with family/visitor present     Time: 1250-1310 PT Time Calculation (min) (ACUTE ONLY): 20 min  Charges:  $Gait Training: 8-22 mins                    G CodesDelorse Shaw:      Tabor, Niesha Bame Beth 04/19/2016, 1:14  PM Elwyn Reach, [place]

## 2016-04-19 NOTE — Care Management Note (Signed)
Case Management Note  Patient Details  Name: Jeffrey Shaw MRN: [id number] Date of Birth: 1951-08-28  Subjective/Objective: 65 yo M s/p L posterior THA        Action/Plan: PT is recommending HHPT   Expected Discharge Date:    04/20/16              Expected Discharge Plan:  [place]  In-House Referral:     Discharge planning Services  CM Consult  Post Acute Care Choice:  Home Health Choice offered to:     DME Arranged:    DME Agency:     HH Arranged:  PT [place]:  [place]  Status of Service:  Completed, signed off  Medicare Important Message Given:    Date Medicare IM Given:    Medicare IM give by:    Date Additional Medicare IM Given:    Additional Medicare Important Message give by:     If discussed at [place] of Stay Meetings, dates discussed:    Additional Comments: met with pt at bedside. He plans to return home with the support of his wife. He has a RW and a cane. He wants to use [facility] for HHPT and referral made to agency prior to admission. Contacted Tiffany at [facility] to confirm referral.  Norina Buzzard, RN 04/19/2016, 2:16 PM

## 2016-04-19 NOTE — Progress Notes (Addendum)
Orthopaedic Trauma Service (OTS)  Subjective: 1 Day Post-Op Procedure(s) (LRB): TOTAL HIP ARTHROPLASTY (Left) Patient reports pain as mild.   Jeffrey Shaw with PT today and has already done stairs.  Eager for d/c tomorrow.  Objective: Current Vitals Blood pressure 151/65, pulse 78, temperature 98.9 F (37.2 C), temperature source Oral, resp. rate 18, weight 268 lb (121.564 kg), SpO2 96 %. Vital signs in last 24 hours: Temp:  [97.5 F (36.4 C)-99.2 F (37.3 C)] 98.9 F (37.2 C) (04/29 0500) Pulse Rate:  [53-83] 78 (04/29 0500) Resp:  [13-23] 18 (04/29 0500) BP: (93-152)/(60-76) 151/65 mmHg (04/29 0500) SpO2:  [94 %-100 %] 96 % (04/29 0500)  Intake/Output from previous day: 04/28 0701 - 04/29 0700 In: 3946 [P.O.:480; I.V.:3266; IV Piggyback:200] Out: 300 [Blood:300]  LABS  Recent Labs  04/19/16 0239  HGB 11.9*    Recent Labs  04/19/16 0239  WBC 9.0  RBC 3.91*  HCT 35.6*  PLT 179    Recent Labs  04/19/16 0239  NA 132*  K 4.8  CL 103  CO2 20*  BUN 20  CREATININE 1.18  GLUCOSE 312*  CALCIUM 8.8*   No results for input(s): LABPT, INR in the last 72 hours.  Physical Exam  LLE Dressing intact, clean, dry  No distal edema/ swelling  Sens: DPN, SPN, TN intact  Motor: EHL, FHL, and lessor toe ext and flex all intact grossly  DP 2+, brisk cap refill, warm to touch   Imaging Dg Hip Port Unilat With Pelvis 1v Left  04/18/2016  CLINICAL DATA:  Post left hip replacement EXAM: DG HIP (WITH OR WITHOUT PELVIS) 1V PORT LEFT COMPARISON:  None. FINDINGS: Two views of the left hip submitted. There is left hip prosthesis with anatomic alignment. Postsurgical changes with small amount of periarticular soft tissue air. Lateral skin staples are noted. IMPRESSION: Left hip prosthesis with anatomic alignment. Electronically Signed   By: Natasha MeadLiviu  Pop M.D.   On: 04/18/2016 11:34    Assessment/Plan: 1 Day Post-Op Procedure(s) (LRB): TOTAL HIP ARTHROPLASTY (Left)  1. Fantastic  early function 2. PT/ OT 3. DVT proph with Plavix being restarted today in addition to two ECASA 325mg  daily per Dr. Madelon Lipsaffrey 4. D/c to home tomorrow  Jeffrey GalasMichael Donnarae Rae, MD [facility] [phone] (p)   04/19/2016, 10:22 AM

## 2016-04-19 NOTE — Progress Notes (Signed)
Physical Therapy Treatment Patient Details Name: Jeffrey Shaw MRN: [id number] DOB: 27-Jan-1951 Today's Date: 04/19/2016    History of Present Illness 65 yo admitted for Left posterior THA. PMhx: CAD, CABG, DM, hypothyroidism, HTN    PT Comments    Pt with excellent progression with gait and HEp today. Pt able to complete stairs, HEp and transfers without cues. Pt able to recall 2/3 precautions but maintains all with movement. Pt safe for return home when medically cleared. Will follow acutely.   Follow Up Recommendations  Home health PT     Equipment Recommendations       Recommendations for Other Services       Precautions / Restrictions Precautions Precautions: Posterior Hip Restrictions LLE Weight Bearing: Weight bearing as tolerated    Mobility  Bed Mobility               General bed mobility comments: in chair on arrival and end of session  Transfers Overall transfer level: Modified independent               General transfer comment: pt able to maintain safe movement and precautions with transfers  Ambulation/Gait Ambulation/Gait assistance: Supervision Ambulation Distance (Feet): 300 Feet Assistive device: Rolling walker (2 wheeled) Gait Pattern/deviations: Step-through pattern;Decreased stride length   Gait velocity interpretation: Below normal speed for age/gender General Gait Details: cues for posture and step-through pattern    Stairs Stairs: Yes Stairs assistance: Min assist Stair Management: Backwards;With walker Number of Stairs: 2 General stair comments: cues for sequence with RW assist backward and handout provided  Wheelchair Mobility    Modified Rankin (Stroke Patients Only)       Balance                                    Cognition Arousal/Alertness: Awake/alert Behavior During Therapy: WFL for tasks assessed/performed Overall Cognitive Status: Within Functional Limits for tasks assessed                       Exercises Total Joint Exercises Hip ABduction/ADduction: AROM;Left;10 reps;Standing Knee Flexion: AROM;Standing;Left;10 reps Marching in Standing: AROM;Standing;Left;10 reps Standing Hip Extension: AROM;Standing;Left;10 reps    General Comments        Pertinent Vitals/Pain Pain Score: 5  Pain Location: left hip with gait Pain Descriptors / Indicators: Aching;Sore Pain Intervention(s): Limited activity within patient's tolerance;Monitored during session;Premedicated before session;Repositioned;Ice applied    Home Living                      Prior Function            PT Goals (current goals can now be found in the care plan section) Progress towards PT goals: Progressing toward goals    Frequency       PT Plan Current plan remains appropriate    Co-evaluation             End of Session   Activity Tolerance: Patient tolerated treatment well Patient left: in chair;with call bell/phone within reach     Time: 0750-0813 PT Time Calculation (min) (ACUTE ONLY): 23 min  Charges:  $Gait Training: 8-22 mins $Therapeutic Exercise: 8-22 mins                    G Codes:      Delorse Lekabor, Darrien Laakso Beth 04/19/2016, 8:31 AM Delaney MeigsMaija Tabor Shellee Streng, PT [phone]

## 2016-04-19 NOTE — Progress Notes (Signed)
Occupational Therapy Treatment Patient Details Name: BOMANI OOMMEN MRN: [id number] DOB: [birth year] Today's Date: 04/19/2016    History of present illness 65 yo admitted for Left posterior THA. PMhx: CAD, CABG, DM, hypothyroidism, HTN   OT comments  Pt. See for skilled OT for education of shower stall transfer.  Pt. Able to return demo of shower stall transfer with S.  Pt. And wife have no further questions and are eager for d/c home.  Clear for D/C from OT standpoint.  Will alert OTR/L to sign off.   Follow Up Recommendations  No OT follow up;Supervision - Intermittent    Equipment Recommendations  None recommended by OT    Recommendations for Other Services      Precautions / Restrictions Precautions Precautions: Posterior Hip Precaution Comments: Pt able recall 2/3 hip precautions. Reviewed all precautions with pt. Restrictions Weight Bearing Restrictions: Yes LLE Weight Bearing: Weight bearing as tolerated       Mobility Bed Mobility               General bed mobility comments: in chair on arrival and end of session  Transfers Overall transfer level: Needs assistance Equipment used: Rolling walker (2 wheeled) Transfers: Sit to/from [employer] Transfers Sit to Stand: Supervision Stand pivot transfers: Supervision       General transfer comment: pt able to maintain safe movement and precautions with transfers    Balance                                   ADL Overall ADL's : Needs assistance/impaired                                 Tub/ Shower Transfer: Walk-in shower;Supervision/safety;Ambulation;Anterior/posterior;Rolling walker   Functional mobility during ADLs: Supervision/safety;Rolling walker General ADL Comments: able to complete shower stall tranfer with S.  declines need for review of LB ADLS secondary to already has a reacher at home.  states he has been amb. to/from b.room in room.  wife present and also had no  further questions.      Vision                     Perception     Praxis      Cognition   Behavior During Therapy: [facility] for tasks assessed/performed Overall Cognitive Status: Within Functional Limits for tasks assessed                       Extremity/Trunk Assessment               Exercises Total Joint Exercises Hip ABduction/ADduction: AROM;Left;10 reps;Standing Knee Flexion: AROM;Standing;Left;10 reps Marching in Standing: AROM;Standing;Left;10 reps Standing Hip Extension: AROM;Standing;Left;10 reps   Shoulder Instructions       General Comments      Pertinent Vitals/ Pain       Pain Assessment: No/denies pain Pain Score: 5  Pain Location: left hip with gait Pain Descriptors / Indicators: Aching;Sore Pain Intervention(s): Limited activity within patient's tolerance;Monitored during session;Premedicated before session;Repositioned;Ice applied  Home Living                                          Prior Functioning/Environment  Frequency Min 2X/week     Progress Toward Goals  OT Goals(current goals can now be found in the care plan section)  Progress towards OT goals: Goals met/education completed, patient discharged from [facility] Discharge plan remains appropriate    Co-evaluation                 End of Session Equipment Utilized During Treatment: Gait belt;Rolling walker   Activity Tolerance Patient tolerated treatment well   Patient Left in chair;with call bell/phone within reach;with family/visitor present   Nurse Communication          Time: 7340-3709 OT Time Calculation (min): 11 min  Charges: OT General Charges $OT Visit: 1 Procedure OT Treatments $Self Care/Home Management : 8-22 mins  Janice Coffin, COTA/L 04/19/2016, 11:03 AM

## 2016-04-20 LAB — CBC
HEMATOCRIT: 32.2 % — AB (ref 39.0–52.0)
HEMOGLOBIN: 11 g/dL — AB (ref 13.0–17.0)
MCH: 31.4 pg (ref 26.0–34.0)
MCHC: 34.2 g/dL (ref 30.0–36.0)
MCV: 92 fL (ref 78.0–100.0)
Platelets: 165 10*3/uL (ref 150–400)
RBC: 3.5 MIL/uL — ABNORMAL LOW (ref 4.22–5.81)
RDW: 12.8 % (ref 11.5–15.5)
WBC: 9.4 10*3/uL (ref 4.0–10.5)

## 2016-04-20 LAB — GLUCOSE, RANDOM: GLUCOSE: 504 mg/dL — AB (ref 65–99)

## 2016-04-20 LAB — GLUCOSE, CAPILLARY: Glucose-Capillary: 411 mg/dL — ABNORMAL HIGH (ref 65–99)

## 2016-04-20 NOTE — Progress Notes (Signed)
Orthopaedic Trauma Service Progress Note  Subjective  Doing great  Ready to go home  Sugars have been elevated in hospital Hgb A1c before surgery 8.1%, last reading in system from 4 years ago 7.3%  Home lantus was not ordered post op which has contributed to elevated CBG Pt states his sugars are perfectly controlled at home   ROS  As above   Objective   BP 152/57 mmHg  Pulse 87  Temp(Src) 98.2 F (36.8 C) (Oral)  Resp 18  Wt 121.564 kg (268 lb)  SpO2 100%  Intake/Output      04/29 0701 - 04/30 0700 04/30 0701 - 05/01 0700   P.O. 480 120   I.V. (mL/kg)     IV Piggyback     Total Intake(mL/kg) 480 (3.9) 120 (1)   Urine (mL/kg/hr)     Blood     Total Output       Net +480 +120        Urine Occurrence 3 x      Labs  Results for Jeffrey Shaw, Jeffrey Shaw (MRN [id number]) as of 04/20/2016 10:35  Ref. Range 04/20/2016 03:31  WBC Latest Ref Range: 4.0-10.5 K/uL 9.4  RBC Latest Ref Range: 4.22-5.81 MIL/uL 3.50 (Shaw)  Hemoglobin Latest Ref Range: 13.0-17.0 g/dL 11.911.0 (Shaw)  HCT Latest Ref Range: 39.0-52.0 % 32.2 (Shaw)  MCV Latest Ref Range: 78.0-100.0 fL 92.0  MCH Latest Ref Range: 26.0-34.0 pg 31.4  MCHC Latest Ref Range: 30.0-36.0 g/dL 14.734.2  RDW Latest Ref Range: 11.5-15.5 % 12.8  Platelets Latest Ref Range: 150-400 K/uL 165   CBG (last 3)   Recent Labs  04/19/16 1729 04/19/16 2132 04/20/16 0634  GLUCAP 330* 284* 411*    Results for Jeffrey Shaw, Jeffrey Shaw (MRN [id number]) as of 04/20/2016 10:35  Ref. Range 04/07/2016 10:31  Hemoglobin A1C Latest Ref Range: 4.8-5.6 % 8.1 (H)    Exam  Gen: in bed, NAD, comfortable Lungs: clear anterior fields Cardiac: RRR, S1 and s2 Abd: + BS, NTND Ext:       Left Lower Extremity   Incision looks great  No erythema   No drainage  No other signs of infection   Swelling stable  Distal motor and sensory functions intact  Ext warm  + DP pulse    Assessment and Plan   POD/HD#: 2  65 y/o white male with ? Controlled DM, endstage DJD Shaw  hip s/p Shaw THA  - endstage DJD Shaw hip s/p Shaw THA  WBAT  Posterior hip precautions  PT/OT  Dressing changed this am  Ice PRN   - Pain management:  Continue with current regimen   - Hemodynamics  Stable  - Medical issues   DM   Discussed at length the critical nature of controlling his blood sugars. Specifically that cbg's consistently >200 increases his risk of deep infection and complications   Restart home regimen   - DVT/PE prophylaxis:  ASA and plavix per surgical team   - ID:   periop abx completed   - Activity:  As above   - Dispo:  Dc home today   Follow up with Dr. Madelon Lipsaffrey in 2 weeks     Mearl LatinKeith W. Brisha Mccabe, PA-C [facility] [phone](P) [phone] (O) 04/20/2016 10:34 AM

## 2016-04-20 NOTE — Progress Notes (Signed)
Morning cbg 411. Called on-call for orders.  Waiting for call back.

## 2016-04-20 NOTE — Discharge Instructions (Signed)

## 2016-04-20 NOTE — Progress Notes (Signed)
Put in order for STAT lab glucose testing for verification as per standing orders.  Still awaiting on call to call back.  Patient is stable.  Will have day-shift to follow up.

## 2016-04-20 NOTE — Progress Notes (Signed)
Physical Therapy Treatment Patient Details Name: Jeffrey Shaw MRN: [id number] DOB: Sep 20, 1951 Today's Date: 04/20/2016    History of Present Illness 65 yo admitted for Left posterior THA. PMhx: CAD, CABG, DM, hypothyroidism, HTN    PT Comments    Pt continues to move well and able to ambulate entire unit today. Pt with cues for ease of transition with bed mobility and remains safe for return home.   Follow Up Recommendations  Home health PT     Equipment Recommendations       Recommendations for Other Services       Precautions / Restrictions Precautions Precautions: Posterior Hip Restrictions LLE Weight Bearing: Weight bearing as tolerated    Mobility  Bed Mobility Overal bed mobility: Needs Assistance       Supine to sit: Supervision     General bed mobility comments: supervision to make sure pt maintains precautions, pt utilizing RLE to assist LLE but not crossing midline  Transfers Overall transfer level: Modified independent                  Ambulation/Gait Ambulation/Gait assistance: Modified independent (Device/Increase time) Ambulation Distance (Feet): 500 Feet Assistive device: Rolling walker (2 wheeled) Gait Pattern/deviations: Step-through pattern;Decreased stride length   Gait velocity interpretation: Below normal speed for age/gender General Gait Details: cues to decrease reliance on upper body only   Stairs            Wheelchair Mobility    Modified Rankin (Stroke Patients Only)       Balance                                    Cognition Arousal/Alertness: Awake/alert Behavior During Therapy: WFL for tasks assessed/performed Overall Cognitive Status: Within Functional Limits for tasks assessed                      Exercises Total Joint Exercises Hip ABduction/ADduction: AROM;Left;Standing;15 reps Long Arc Quad: AROM;Seated;Left;15 reps Knee Flexion: AROM;Standing;Left;15 reps Marching in  Standing: AROM;Standing;Left;15 reps Standing Hip Extension: AROM;Standing;Left;15 reps    General Comments        Pertinent Vitals/Pain Pain Score: 3  Pain Location: left thigh Pain Descriptors / Indicators: Aching Pain Intervention(s): Limited activity within patient's tolerance;Monitored during session;Premedicated before session;Repositioned;Ice applied    Home Living                      Prior Function            PT Goals (current goals can now be found in the care plan section) Progress towards PT goals: Progressing toward goals    Frequency       PT Plan Current plan remains appropriate    Co-evaluation             End of Session   Activity Tolerance: Patient tolerated treatment well Patient left: in bed;with call bell/phone within reach     Time: 0728-0752 PT Time Calculation (min) (ACUTE ONLY): 24 min  Charges:  $Gait Training: 8-22 mins $Therapeutic Exercise: 8-22 mins                    G Codes:      Delorse Lekabor, Jocelyn Nold Beth 04/20/2016, 7:58 AM Delaney MeigsMaija Tabor Maryann Mccall, PT [phone]

## 2016-04-20 NOTE — Progress Notes (Signed)
Discussed discharge summary with patient. Reviewed all medications with patient. Patient received Rx. Patient ready for discharge. 

## 2016-04-20 NOTE — Progress Notes (Signed)
On call stated to give Novolog sliding scale for the 350-399 range along with meal coverage.

## 2016-04-21 ENCOUNTER — Encounter (HOSPITAL_COMMUNITY): Payer: Self-pay | Admitting: Orthopedic Surgery

## 2016-05-15 NOTE — Discharge Summary (Signed)
PATIENT ID: Jeffrey Shaw        MRN:  [id number]          DOB/AGE: 66-Sep-1952 / 66 y.o.    DISCHARGE SUMMARY  ADMISSION DATE:    04/18/2016 DISCHARGE DATE:   04/20/2016  ADMISSION DIAGNOSIS: OA LEFT HIP    DISCHARGE DIAGNOSIS:  OA LEFT HIP    ADDITIONAL DIAGNOSIS: Active Problems:   Primary osteoarthritis of left hip  Past Medical History  Diagnosis Date  . CAD (coronary artery disease)     CABG 01/2008 /  Catheterization, November, 2012, LIMA to the LAD patent,  70% distal left main stenosis involving the proximal circumflex, DES to the left main continuing into the circumflex  . HTN (hypertension)   . Hyperlipemia   . Hypothyroid   . GERD (gastroesophageal reflux disease)   . Diabetes mellitus     [facility]  . Hx of CABG     2009, LIMA to LAD, right radial to PDA, SVG to diagonal, SVG to OM1  . Arthritis     PROCEDURE: Procedure(s): TOTAL HIP ARTHROPLASTY  Left  on 04/18/2016  CONSULTS:     HISTORY: see h&p   HOSPITAL COURSE:  Jeffrey Shaw is a 65 y.o. admitted on 04/18/2016 and found to have a diagnosis of OA LEFT HIP.  After appropriate laboratory studies were obtained  they were taken to the operating room on 04/18/2016 and underwent  Left Procedure(s): TOTAL HIP ARTHROPLASTY.   They were given perioperative antibiotics:  Anti-infectives    Start     Dose/Rate Route Frequency Ordered Stop   04/18/16 1330  ceFAZolin (ANCEF) IVPB 2g/100 mL premix     2 g 200 mL/hr over 30 Minutes Intravenous Every 6 hours 04/18/16 1220 04/18/16 1956   04/18/16 0700  ceFAZolin (ANCEF) 3 g in dextrose 5 % 50 mL IVPB     3 g 130 mL/hr over 30 Minutes Intravenous To [facility] 04/17/16 1247 04/18/16 0805    .  Tolerated the procedure well.   POD #1, allowed out of bed to a chair.  PT for ambulation and exercise program.    IV saline locked.  O2 discontionued.   POD #2, continued PT and ambulation. Dressing changed.  The remainder of the hospital course was  dedicated to ambulation and strengthening.   The patient was discharged on 2 days post op in  Stable condition.  Blood products given:  none  DIAGNOSTIC STUDIES: Recent vital signs: No data found.      Recent laboratory studies: No results for input(s): WBC, HGB, HCT, PLT in the last 168 hours. No results for input(s): NA, K, CL, CO2, BUN, CREATININE, GLUCOSE, CALCIUM in the last 168 hours. Lab Results  Component Value Date   INR 1.01 04/07/2016   INR 1.05 10/29/2011   INR 1.2 01/27/2008     Recent Radiographic Studies :  Dg Hip Port Unilat With Pelvis 1v Left  04/18/2016  CLINICAL DATA:  Post left hip replacement EXAM: DG HIP (WITH OR WITHOUT PELVIS) 1V PORT LEFT COMPARISON:  None. FINDINGS: Two views of the left hip submitted. There is left hip prosthesis with anatomic alignment. Postsurgical changes with small amount of periarticular soft tissue air. Lateral skin staples are noted. IMPRESSION: Left hip prosthesis with anatomic alignment. Electronically Signed   By: Natasha MeadLiviu  Pop M.D.   On: 04/18/2016 11:34    DISCHARGE INSTRUCTIONS:   DISCHARGE MEDICATIONS:     Medication List  STOP taking these medications        HYDROcodone-acetaminophen 5-325 MG tablet  Commonly known as:  NORCO/VICODIN     nitroGLYCERIN 0.4 MG SL tablet  Commonly known as:  NITROSTAT      TAKE these medications        aspirin EC 325 MG tablet  Take 1 tablet (325 mg total) by mouth 2 (two) times daily. Take  aspirin twice daily for 30 days then may resume your normal dose  aspirin following.     clopidogrel 75 MG tablet  Commonly known as:  PLAVIX  Take 75 mg by mouth at bedtime.     famotidine 20 MG tablet  Commonly known as:  PEPCID  Take 20 mg by mouth 2 (two) times daily.     insulin aspart 100 UNIT/ML injection  Commonly known as:  novoLOG  Inject 15-20 Units into the skin 3 (three) times daily before meals. Patient uses home sliding scale     insulin glargine 100 UNIT/ML  injection  Commonly known as:  LANTUS  Inject 40 Units into the skin at bedtime.     levothyroxine 200 MCG tablet  Commonly known as:  SYNTHROID, LEVOTHROID  Take 200 mcg by mouth daily.     lisinopril 10 MG tablet  Commonly known as:  PRINIVIL,ZESTRIL  Take 10 mg by mouth daily.     metoprolol tartrate 25 MG tablet  Commonly known as:  LOPRESSOR  Take 12.5 mg by mouth 2 (two) times daily.     multivitamin tablet  Take 1 tablet by mouth daily.     oxyCODONE-acetaminophen 5-325 MG tablet  Commonly known as:  ROXICET  Take 1-2 tablets by mouth every 4 (four) hours as needed for moderate pain or severe pain.     pravastatin 40 MG tablet  Commonly known as:  PRAVACHOL  Take 40 mg by mouth at bedtime.        FOLLOW UP VISIT:       Follow-up Information    Follow up with Kanoa Phillippi JR,W D, MD. Schedule an appointment as soon as possible for a visit in 2 weeks.   Specialty:  Orthopedic Surgery   Contact information:   [address] [place] [address] [phone]       DISPOSITION:  Home    CONDITION:  Stable   Margart Sickles 05/15/2016, 5:06 PM

## 2017-02-24 ENCOUNTER — Ambulatory Visit: Payer: Medicare Other | Admitting: Cardiovascular Disease

## 2018-04-19 LAB — HM HEPATITIS C SCREENING LAB: HM Hepatitis Screen: NEGATIVE

## 2018-12-24 DIAGNOSIS — E119 Type 2 diabetes mellitus without complications: Secondary | ICD-10-CM | POA: Diagnosis not present

## 2018-12-24 DIAGNOSIS — H40033 Anatomical narrow angle, bilateral: Secondary | ICD-10-CM | POA: Diagnosis not present

## 2018-12-24 LAB — HM DIABETES EYE EXAM

## 2019-01-07 DIAGNOSIS — E039 Hypothyroidism, unspecified: Secondary | ICD-10-CM | POA: Diagnosis not present

## 2019-01-07 DIAGNOSIS — I10 Essential (primary) hypertension: Secondary | ICD-10-CM | POA: Diagnosis not present

## 2019-01-07 DIAGNOSIS — E109 Type 1 diabetes mellitus without complications: Secondary | ICD-10-CM | POA: Diagnosis not present

## 2019-01-07 DIAGNOSIS — E782 Mixed hyperlipidemia: Secondary | ICD-10-CM | POA: Diagnosis not present

## 2019-01-07 DIAGNOSIS — Z23 Encounter for immunization: Secondary | ICD-10-CM | POA: Diagnosis not present

## 2019-01-07 DIAGNOSIS — Z0000 Encounter for general adult medical examination without abnormal findings: Secondary | ICD-10-CM | POA: Diagnosis not present

## 2019-01-07 DIAGNOSIS — Z130 Encounter for screening for diseases of the blood and blood-forming organs and certain disorders involving the immune mechanism: Secondary | ICD-10-CM | POA: Diagnosis not present

## 2019-06-28 ENCOUNTER — Other Ambulatory Visit: Payer: Self-pay

## 2019-06-29 ENCOUNTER — Encounter: Payer: Self-pay | Admitting: Family Medicine

## 2019-07-06 ENCOUNTER — Encounter: Payer: Self-pay | Admitting: Family Medicine

## 2019-07-06 ENCOUNTER — Other Ambulatory Visit: Payer: Self-pay

## 2019-07-06 ENCOUNTER — Ambulatory Visit (INDEPENDENT_AMBULATORY_CARE_PROVIDER_SITE_OTHER): Payer: Medicare Other | Admitting: Family Medicine

## 2019-07-06 ENCOUNTER — Encounter (INDEPENDENT_AMBULATORY_CARE_PROVIDER_SITE_OTHER): Payer: Self-pay

## 2019-07-06 VITALS — BP 117/72 | HR 81 | Temp 98.2°F | Ht 76.0 in | Wt 254.6 lb

## 2019-07-06 DIAGNOSIS — Z7689 Persons encountering health services in other specified circumstances: Secondary | ICD-10-CM

## 2019-07-06 DIAGNOSIS — I10 Essential (primary) hypertension: Secondary | ICD-10-CM

## 2019-07-06 DIAGNOSIS — Z130 Encounter for screening for diseases of the blood and blood-forming organs and certain disorders involving the immune mechanism: Secondary | ICD-10-CM | POA: Diagnosis not present

## 2019-07-06 DIAGNOSIS — E039 Hypothyroidism, unspecified: Secondary | ICD-10-CM | POA: Diagnosis not present

## 2019-07-06 DIAGNOSIS — Z136 Encounter for screening for cardiovascular disorders: Secondary | ICD-10-CM

## 2019-07-06 DIAGNOSIS — Z794 Long term (current) use of insulin: Secondary | ICD-10-CM

## 2019-07-06 DIAGNOSIS — Z87891 Personal history of nicotine dependence: Secondary | ICD-10-CM

## 2019-07-06 DIAGNOSIS — Z1211 Encounter for screening for malignant neoplasm of colon: Secondary | ICD-10-CM

## 2019-07-06 DIAGNOSIS — E119 Type 2 diabetes mellitus without complications: Secondary | ICD-10-CM

## 2019-07-06 LAB — BAYER DCA HB A1C WAIVED: HB A1C (BAYER DCA - WAIVED): 7.2 % — ABNORMAL HIGH (ref <7.0)

## 2019-07-06 MED ORDER — OZEMPIC (0.25 OR 0.5 MG/DOSE) 2 MG/1.5ML ~~LOC~~ SOPN: 0.2500 mg | PEN_INJECTOR | SUBCUTANEOUS | 2 refills | Status: DC

## 2019-07-06 NOTE — Patient Instructions (Addendum)
May decrease Levemir by 5 units weekly for the next 4 weeks with addition of Ozempic.  Semaglutide injection solution What is this medicine? SEMAGLUTIDE (Sem a GLOO tide) is used to improve blood sugar control in adults with type 2 diabetes. This medicine may be used with other diabetes medicines. This drug may also reduce the risk of heart attack or stroke if you have type 2 diabetes and risk factors for heart disease. This medicine may be used for other purposes; ask your health care provider or pharmacist if you have questions. COMMON BRAND NAME(S): OZEMPIC What should I tell my health care provider before I take this medicine? They need to know if you have any of these conditions:  endocrine tumors (MEN 2) or if someone in your family had these tumors  eye disease, vision problems  history of pancreatitis  kidney disease  stomach problems  thyroid cancer or if someone in your family had thyroid cancer  an unusual or allergic reaction to semaglutide, other medicines, foods, dyes, or preservatives  pregnant or trying to get pregnant  breast-feeding How should I use this medicine? This medicine is for injection under the skin of your upper leg (thigh), stomach area, or upper arm. It is given once every week (every 7 days). You will be taught how to prepare and give this medicine. Use exactly as directed. Take your medicine at regular intervals. Do not take it more often than directed. If you use this medicine with insulin, you should inject this medicine and the insulin separately. Do not mix them together. Do not give the injections right next to each other. Change (rotate) injection sites with each injection. It is important that you put your used needles and syringes in a special sharps container. Do not put them in a trash can. If you do not have a sharps container, call your pharmacist or healthcare provider to get one. A special MedGuide will be given to you by the pharmacist  with each prescription and refill. Be sure to read this information carefully each time. Talk to your pediatrician regarding the use of this medicine in children. Special care may be needed. Overdosage: If you think you have taken too much of this medicine contact a poison control center or emergency room at once. NOTE: This medicine is only for you. Do not share this medicine with others. What if I miss a dose? If you miss a dose, take it as soon as you can within 5 days after the missed dose. Then take your next dose at your regular weekly time. If it has been longer than 5 days after the missed dose, do not take the missed dose. Take the next dose at your regular time. Do not take double or extra doses. If you have questions about a missed dose, contact your health care provider for advice. What may interact with this medicine?  other medicines for diabetes Many medications may cause changes in blood sugar, these include:  alcohol containing beverages  antiviral medicines for HIV or AIDS  aspirin and aspirin-like drugs  certain medicines for blood pressure, heart disease, irregular heart beat  chromium  diuretics  male hormones, such as estrogens or progestins, birth control pills  fenofibrate  gemfibrozil  isoniazid  lanreotide  male hormones or anabolic steroids  MAOIs like Carbex, Eldepryl, Marplan, Nardil, and Parnate  medicines for weight loss  medicines for allergies, asthma, cold, or cough  medicines for depression, anxiety, or psychotic disturbances  niacin  nicotine  NSAIDs, medicines for pain and inflammation, like ibuprofen or naproxen  octreotide  pasireotide  pentamidine  phenytoin  probenecid  quinolone antibiotics such as ciprofloxacin, levofloxacin, ofloxacin  some herbal dietary supplements  steroid medicines such as prednisone or cortisone  sulfamethoxazole; trimethoprim  thyroid hormones Some medications can hide the warning  symptoms of low blood sugar (hypoglycemia). You may need to monitor your blood sugar more closely if you are taking one of these medications. These include:  beta-blockers, often used for high blood pressure or heart problems (examples include atenolol, metoprolol, propranolol)  clonidine  guanethidine  reserpine This list may not describe all possible interactions. Give your health care provider a list of all the medicines, herbs, non-prescription drugs, or dietary supplements you use. Also tell them if you smoke, drink alcohol, or use illegal drugs. Some items may interact with your medicine. What should I watch for while using this medicine? Visit your doctor or health care professional for regular checks on your progress. Drink plenty of fluids while taking this medicine. Check with your doctor or health care professional if you get an attack of severe diarrhea, nausea, and vomiting. The loss of too much body fluid can make it dangerous for you to take this medicine. A test called the HbA1C (A1C) will be monitored. This is a simple blood test. It measures your blood sugar control over the last 2 to 3 months. You will receive this test every 3 to 6 months. Learn how to check your blood sugar. Learn the symptoms of low and high blood sugar and how to manage them. Always carry a quick-source of sugar with you in case you have symptoms of low blood sugar. Examples include hard sugar candy or glucose tablets. Make sure others know that you can choke if you eat or drink when you develop serious symptoms of low blood sugar, such as seizures or unconsciousness. They must get medical help at once. Tell your doctor or health care professional if you have high blood sugar. You might need to change the dose of your medicine. If you are sick or exercising more than usual, you might need to change the dose of your medicine. Do not skip meals. Ask your doctor or health care professional if you should avoid  alcohol. Many nonprescription cough and cold products contain sugar or alcohol. These can affect blood sugar. Pens should never be shared. Even if the needle is changed, sharing may result in passing of viruses like hepatitis or HIV. Wear a medical ID bracelet or chain, and carry a card that describes your disease and details of your medicine and dosage times. Do not become pregnant while taking this medicine. Women should inform their doctor if they wish to become pregnant or think they might be pregnant. There is a potential for serious side effects to an unborn child. Talk to your health care professional or pharmacist for more information. What side effects may I notice from receiving this medicine? Side effects that you should report to your doctor or health care professional as soon as possible:  allergic reactions like skin rash, itching or hives, swelling of the face, lips, or tongue  breathing problems  changes in vision  diarrhea that continues or is severe  lump or swelling on the neck  severe nausea  signs and symptoms of infection like fever or chills; cough; sore throat; pain or trouble passing urine  signs and symptoms of low blood sugar such as feeling anxious, confusion, dizziness, increased hunger,  unusually weak or tired, sweating, shakiness, cold, irritable, headache, blurred vision, fast heartbeat, loss of consciousness  signs and symptoms of kidney injury like trouble passing urine or change in the amount of urine  trouble swallowing  unusual stomach upset or pain  vomiting Side effects that usually do not require medical attention (report to your doctor or health care professional if they continue or are bothersome):  constipation  diarrhea  nausea  pain, redness, or irritation at site where injected  stomach upset This list may not describe all possible side effects. Call your doctor for medical advice about side effects. You may report side effects to  FDA at 1-800-FDA-1088. Where should I keep my medicine? Keep out of the reach of children. Store unopened pens in a refrigerator between 2 and 8 degrees C (36 and 46 degrees F). Do not freeze. Protect from light and heat. After you first use the pen, it can be stored for 56 days at room temperature between 15 and 30 degrees C (59 and 86 degrees F) or in a refrigerator. Throw away your used pen after 56 days or after the expiration date, whichever comes first. Do not store your pen with the needle attached. If the needle is left on, medicine may leak from the pen. NOTE: This sheet is a summary. It may not cover all possible information. If you have questions about this medicine, talk to your doctor, pharmacist, or health care provider.  2020 Elsevier/Gold Standard (2019-01-07 14:25:32)

## 2019-07-06 NOTE — Progress Notes (Signed)
New Patient Office Visit  Subjective:  Patient ID: Jeffrey Shaw, male    DOB: [birth year]  Age: 68 y.o. MRN: [id number]  CC:  Chief Complaint  Patient presents with  . New Patient (Initial Visit)    HPI Jeffrey Shaw presents to establish care and for a follow-up of his diabetes.   Diabetes Mellitus: Patient presents for follow up of diabetes. Current symptoms include: none. Known diabetic complications: cardiovascular disease Cardiovascular risk factors: advanced age (older than 64 for men, 60 for women), diabetes mellitus, dyslipidemia, hypertension, male gender and obesity (BMI >= 30 kg/m2). Current diabetic medications include insulin injections: Novolog 15-20 units TID per sliding scale AND Levemir 40 units QHS.   Eye exam current (within one year): yes (record requested). Weight trend: decreasing steadily; patient has started intermittent fasting. Current diet: in general, a "healthy" diet  . Current exercise: aerobics, bicycling, cardiovascular workout on exercise equipment, swimming, walking and weightlifting.   Current monitoring regimen: home blood tests - 4 times daily. Any episodes of hypoglycemia? No.   Is He on ACE inhibitor or angiotensin II receptor blocker? Yes, lisinopril (generic).    Review of Systems  Constitutional: Negative for chills, fever, malaise/fatigue and weight loss.  HENT: Negative for congestion, ear discharge, ear pain, nosebleeds, sinus pain, sore throat and tinnitus.   Eyes: Negative for blurred vision, double vision, pain, discharge and redness.  Respiratory: Negative for cough, shortness of breath and wheezing.   Cardiovascular: Negative for chest pain, palpitations and leg swelling.  Gastrointestinal: Negative for abdominal pain, constipation, diarrhea, heartburn, nausea and vomiting.  Genitourinary: Negative for dysuria, frequency and urgency.  Musculoskeletal: Negative for myalgias.  Skin: Negative for rash.  Neurological: Negative for  dizziness, seizures, weakness and headaches.  Psychiatric/Behavioral: Negative for depression, substance abuse and suicidal ideas. The patient is not nervous/anxious.      Current Outpatient Medications:  .  aspirin EC 81 MG tablet, Take by mouth., Disp: , Rfl:  .  atorvastatin (LIPITOR) 40 MG tablet, Take by mouth., Disp: , Rfl:  .  famotidine (PEPCID) 20 MG tablet, Take 20 mg by mouth 2 (two) times daily., Disp: , Rfl:  .  insulin aspart (NOVOLOG) 100 UNIT/ML injection, Inject 15-20 Units into the skin 3 (three) times daily before meals. Patient uses home sliding scale, Disp: , Rfl:  .  Insulin Detemir (LEVEMIR FLEXTOUCH) 100 UNIT/ML Pen, Inject 40 units at night, Disp: , Rfl:  .  levothyroxine (SYNTHROID, LEVOTHROID) 200 MCG tablet, Take 200 mcg by mouth daily.  , Disp: , Rfl:  .  lisinopril (PRINIVIL,ZESTRIL) 10 MG tablet, Take 10 mg by mouth daily.  , Disp: , Rfl:  .  metoprolol tartrate (LOPRESSOR) 25 MG tablet, Take 12.5 mg by mouth 2 (two) times daily. , Disp: , Rfl:  .  Multiple Vitamin (MULTIVITAMIN) tablet, Take 1 tablet by mouth daily.  , Disp: , Rfl:  .  Semaglutide,0.25 or 0.5MG/DOS, (OZEMPIC, 0.25 OR 0.5 MG/DOSE,) 2 MG/1.5ML SOPN, Inject 0.25 mg into the skin once a week. x2 weeks, then increase to 0.5 mg weekly, Disp: 1.5 mL, Rfl: 2 .  sildenafil (VIAGRA) 100 MG tablet, TAKE 1 TABLET BY MOUTH ONCE DAILY AS NEEDED FOR ERECTILE DYSFUNCTION, Disp: , Rfl:   Allergies  Allergen Reactions  . Morphine And Related Nausea Only    Past Medical History:  Diagnosis Date  . Arthritis   . CAD (coronary artery disease)    CABG 01/2008 /  Catheterization, November, 2012, LIMA to  the LAD patent,  70% distal left main stenosis involving the proximal circumflex, DES to the left main continuing into the circumflex  . CORONARY ARTERY BYPASS GRAFT, HX OF 01/17/2010   Qualifier: Diagnosis of  By: Ron Parker, MD, Leonidas Romberg Dorinda Hill   . Diabetes mellitus    Dr. Tod Persia, [facility]  . GERD  (gastroesophageal reflux disease)   . HTN (hypertension)   . 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 Hyperlipemia   . Hypothyroid   . Primary osteoarthritis of left hip 04/18/2016    Past Surgical History:  Procedure Laterality Date  . BACK SURGERY    . CARPAL TUNNEL RELEASE     left hand  . CORONARY ANGIOPLASTY    . CORONARY ARTERY BYPASS GRAFT  01/2008   X 6  . INGUINAL HERNIA REPAIR     right  . LEFT HEART CATHETERIZATION WITH CORONARY/GRAFT ANGIOGRAM N/A 10/30/2011   Procedure: LEFT HEART CATHETERIZATION WITH Beatrix Fetters;  Surgeon: Hillary Bow, MD;  Location[facility] C[facility] LAB;  Service: Cardiovascular;  Laterality: N/A;  . LUMBAR DISC SURGERY     removed  . PERCUTANEOUS CORONARY STENT INTERVENTION (PCI-S) N/A 11/03/2011   Procedure: PERCUTANEOUS CORONARY STENT INTERVENTION (PCI-S);  Surgeon: Burnell Blanks, MD;  Location: [facility] C[facility] LAB;  Service: Cardiovascular;  Laterality: N/A;  . TONSILLECTOMY  ~ 1955  . TOTAL HIP ARTHROPLASTY Left 04/18/2016   Procedure: TOTAL HIP ARTHROPLASTY;  Surgeon: Earlie Server, MD;  Location: [place];  Service: Orthopedics;  Laterality: Left;  . URETEROLITHOTOMY Left    with stone basket removal    Family History  Problem Relation Age of Onset  . Dementia Mother   . Liver disease Mother   . Suicidality Father   . Emphysema Father   . Drug abuse Brother        Clean now  . Rheum arthritis Sister   . Heart attack Maternal Grandmother   . Emphysema Maternal Grandfather   . Stroke Maternal Grandfather   . Stomach cancer Paternal Grandmother   . Diabetes Paternal Aunt   . Diabetes Paternal Aunt     Social History   Socioeconomic History  . Marital status: Married    Spouse name: SANDRA  . Number of children: Not on file  . Years of education: Not on file  . Highest education level: Not on file  Occupational History  . Occupation: SELF EMPLOYED  Social Needs  . Financial  resource strain: Not on file  . Food insecurity    Worry: Not on file    Inability: Not on file  . Transportation needs    Medical: Not on file    Non-medical: Not on file  Tobacco Use  . Smoking status: Former Smoker    Packs/day: 0.80    Years: 6.00    Pack years: 4.80    Types: Cigarettes    Quit date: 12/22/1968    Years since quitting: 50.5  . Smokeless tobacco: Never Used  Substance and Sexual Activity  . Alcohol use: Yes    Alcohol/week: 2.0 standard drinks    Types: 2 Cans of beer per week  . Drug use: No  . Sexual activity: Not on file  Lifestyle  . Physical activity    Days per week: Not on file    Minutes per session: Not on file  . Stress: Not on file  Relationships  . Social connections  Talks on phone: Not on file    Gets together: Not on file    Attends religious service: Not on file    Active member of club or organization: Not on file    Attends meetings of clubs or organizations: Not on file    Relationship status: Not on file  . Intimate partner violence    Fear of current or ex partner: Not on file    Emotionally abused: Not on file    Physically abused: Not on file    Forced sexual activity: Not on file  Other Topics Concern  . Not on file  Social History Narrative  . Not on file    Objective:   Today's Vitals: BP 117/72   Pulse 81   Temp 98.2 F (36.8 C) (Oral)   Ht _0  (1.93 m)   Wt 254 lb 9.6 oz (115.5 kg)   BMI 30.99 kg/m     Physical Exam Vitals signs reviewed.  Constitutional:      General: He is not in acute distress.    Appearance: Normal appearance. He is obese. He is not ill-appearing, toxic-appearing or diaphoretic.  HENT:     Head: Normocephalic and atraumatic.  Eyes:     General: No scleral icterus.       Right eye: No discharge.        Left eye: No discharge.     Conjunctiva/sclera: Conjunctivae normal.  Neck:     Musculoskeletal: Normal range of motion.  Cardiovascular:     Rate and Rhythm: Normal rate and  regular rhythm.     Heart sounds: Normal heart sounds. No murmur. No friction rub. No gallop.   Pulmonary:     Effort: Pulmonary effort is normal. No respiratory distress.     Breath sounds: Normal breath sounds. No stridor. No wheezing, rhonchi or rales.  Musculoskeletal: Normal range of motion.  Skin:    General: Skin is warm and dry.  Neurological:     Mental Status: He is alert and oriented to person, place, and time. Mental status is at baseline.  Psychiatric:        Mood and Affect: Mood normal.        Behavior: Behavior normal.        Thought Content: Thought content normal.        Judgment: Judgment normal.    Diabetic Foot Exam - Simple   Simple Foot Form Diabetic Foot exam was performed with the following findings: Yes 07/06/2019  3:45 PM  Visual Inspection No deformities, no ulcerations, no other skin breakdown bilaterally: Yes Sensation Testing Intact to touch and monofilament testing bilaterally: Yes Pulse Check Posterior Tibialis and Dorsalis pulse intact bilaterally: Yes Comments     Assessment & Plan:   1. Controlled type 2 diabetes mellitus without complication, with long-term current use of insulin (HCC) Last A1c 6.7 on 01/07/2019.   Medications: Ozempic added; patient will decrease Levemir slowly over the next 4 weeks Patient is currently taking a statin. Patient is taking an ACE-inhibitor/ARB.  Last foot exam: 07/06/2019 Last diabetic eye exam: within the past year; record requested from [facility] Urine Microalbumin/Creat Ratio: 07/06/2019 Instruction/counseling given: discussed foot care, discussed the need for weight loss and provided printed educational material  - Semaglutide,0.25 or 0.5MG/DOS, (OZEMPIC, 0.25 OR 0.5 MG/DOSE,) 2 MG/1.5ML SOPN; Inject 0.25 mg into the skin once a week. x2 weeks, then increase to 0.5 mg weekly  Dispense: 1.5 mL; Refill: 2 - CBC with Differential/Platelet -  CMP14+EGFR - Bayer DCA Hb A1c Waived - Microalbumin /  creatinine urine ratio  2. Hypothyroidism, unspecified type - Well controlled on current regimen. - CBC with Differential/Platelet - TSH  3. Essential hypertension - Well controlled on current regimen.  4. Screening for deficiency anemia - CBC with Differential/Platelet  5. Colon cancer screening - Cologuard  6. Screening for AAA (abdominal aortic aneurysm) - US AORTA DUPLEX LIMITED; Future  7. Former smoker - US AORTA DUPLEX LIMITED; Future  8. Encounter to establish care - Transferring from [place] at [facility] to follow me here.    Follow-up: Return in about 3 months (around 10/06/2019) for DM/weight.   Loman Brooklyn, FNP

## 2019-07-07 ENCOUNTER — Encounter: Payer: Self-pay | Admitting: Family Medicine

## 2019-07-07 DIAGNOSIS — N5201 Erectile dysfunction due to arterial insufficiency: Secondary | ICD-10-CM | POA: Insufficient documentation

## 2019-07-07 LAB — CBC WITH DIFFERENTIAL/PLATELET
Basophils Absolute: 0.1 10*3/uL (ref 0.0–0.2)
Basos: 1 %
EOS (ABSOLUTE): 0.2 10*3/uL (ref 0.0–0.4)
Eos: 2 %
Hematocrit: 42 % (ref 37.5–51.0)
Hemoglobin: 14.3 g/dL (ref 13.0–17.7)
Immature Grans (Abs): 0 10*3/uL (ref 0.0–0.1)
Immature Granulocytes: 0 %
Lymphocytes Absolute: 2.4 10*3/uL (ref 0.7–3.1)
Lymphs: 30 %
MCH: 30.9 pg (ref 26.6–33.0)
MCHC: 34 g/dL (ref 31.5–35.7)
MCV: 91 fL (ref 79–97)
Monocytes Absolute: 0.8 10*3/uL (ref 0.1–0.9)
Monocytes: 9 %
Neutrophils Absolute: 4.8 10*3/uL (ref 1.4–7.0)
Neutrophils: 58 %
Platelets: 266 10*3/uL (ref 150–450)
RBC: 4.63 x10E6/uL (ref 4.14–5.80)
RDW: 12 % (ref 11.6–15.4)
WBC: 8.2 10*3/uL (ref 3.4–10.8)

## 2019-07-07 LAB — CMP14+EGFR
ALT: 30 IU/L (ref 0–44)
AST: 29 IU/L (ref 0–40)
Albumin/Globulin Ratio: 1.5 (ref 1.2–2.2)
Albumin: 4.5 g/dL (ref 3.8–4.8)
Alkaline Phosphatase: 84 IU/L (ref 39–117)
BUN/Creatinine Ratio: 16 (ref 10–24)
BUN: 16 mg/dL (ref 8–27)
Bilirubin Total: 1.5 mg/dL — ABNORMAL HIGH (ref 0.0–1.2)
CO2: 20 mmol/L (ref 20–29)
Calcium: 10.1 mg/dL (ref 8.6–10.2)
Chloride: 103 mmol/L (ref 96–106)
Creatinine, Ser: 1.02 mg/dL (ref 0.76–1.27)
GFR calc Af Amer: 86 mL/min/{1.73_m2} (ref >59)
GFR calc non Af Amer: 75 mL/min/{1.73_m2} (ref >59)
Globulin, Total: 3.1 g/dL (ref 1.5–4.5)
Glucose: 165 mg/dL — ABNORMAL HIGH (ref 65–99)
Potassium: 4.6 mmol/L (ref 3.5–5.2)
Sodium: 137 mmol/L (ref 134–144)
Total Protein: 7.6 g/dL (ref 6.0–8.5)

## 2019-07-07 LAB — MICROALBUMIN / CREATININE URINE RATIO
Creatinine, Urine: 160.8 mg/dL
Microalb/Creat Ratio: 15 mg/g creat (ref 0–29)
Microalbumin, Urine: 23.6 ug/mL

## 2019-07-07 LAB — TSH: TSH: 0.216 u[IU]/mL — ABNORMAL LOW (ref 0.450–4.500)

## 2019-07-08 ENCOUNTER — Other Ambulatory Visit: Payer: Self-pay | Admitting: Family Medicine

## 2019-07-08 DIAGNOSIS — E039 Hypothyroidism, unspecified: Secondary | ICD-10-CM

## 2019-07-08 MED ORDER — LEVOTHYROXINE SODIUM 175 MCG PO TABS: 175.0000 ug | ORAL_TABLET | Freq: Every day | ORAL | 1 refills | Status: DC

## 2019-07-12 ENCOUNTER — Telehealth: Payer: Self-pay | Admitting: *Deleted

## 2019-07-12 DIAGNOSIS — E119 Type 2 diabetes mellitus without complications: Secondary | ICD-10-CM

## 2019-07-12 NOTE — Telephone Encounter (Signed)
Insurance Denied Ozempic (0.25 or 0.5mg /dose) 2mg /1.32ml  Please send alternate or I can try PA

## 2019-07-13 ENCOUNTER — Telehealth: Payer: Self-pay | Admitting: *Deleted

## 2019-07-13 MED ORDER — BYDUREON BCISE 2 MG/0.85ML ~~LOC~~ AUIJ: 2.0000 mg | AUTO-INJECTOR | SUBCUTANEOUS | 2 refills | Status: DC

## 2019-07-13 NOTE — Telephone Encounter (Addendum)
Prior Auth for [employer] 2MG /0.85ML auto-injectors-APPROVED 07/13/2019-07/12/2020   Key: [id number] - Rx #: [id number]   If Sherre Poot [place] has not responded within the specified timeframe or if you have any questions about your PA submission, contact [employer]  directly at [facility]) [phone] or ([place]) [phone].

## 2019-07-13 NOTE — Telephone Encounter (Signed)
Alternative sent

## 2019-07-26 ENCOUNTER — Telehealth: Payer: Self-pay | Admitting: Family Medicine

## 2019-07-26 NOTE — Telephone Encounter (Signed)
Explained to patient we are not able to accept medications at the practice, gave pt telephone number to patient assistance program at [facility]

## 2019-07-27 NOTE — Telephone Encounter (Signed)
For for patient assistance program in [place] placed on providers desk for signature to fax to [employer]

## 2019-07-29 ENCOUNTER — Other Ambulatory Visit: Payer: Self-pay | Admitting: Family Medicine

## 2019-07-29 DIAGNOSIS — Z9114 Patient's other noncompliance with medication regimen: Secondary | ICD-10-CM

## 2019-08-04 ENCOUNTER — Ambulatory Visit: Payer: Medicare Other | Admitting: *Deleted

## 2019-08-04 ENCOUNTER — Encounter: Payer: Self-pay | Admitting: *Deleted

## 2019-08-04 DIAGNOSIS — Z794 Long term (current) use of insulin: Secondary | ICD-10-CM

## 2019-08-04 DIAGNOSIS — I10 Essential (primary) hypertension: Secondary | ICD-10-CM

## 2019-08-04 DIAGNOSIS — E119 Type 2 diabetes mellitus without complications: Secondary | ICD-10-CM

## 2019-08-04 NOTE — Chronic Care Management (AMB) (Signed)
  Care Management   Addendum  08/04/2019 Name: Jeffrey Shaw MRN: [id number] DOB: 17-Jul-1951  I spoke with Mr Shanon Brow by telephone today. He is working with the [employer] Program through the [facility] Dept and will be receiving his medications through them. He does not need any additional assistance with medication procurement or management at this time.   Follow up plan: No follow up needed at this time but the CCM team will remain available for any future needs.    Chong Sicilian BSN, RN-BC Embedded Chronic Care Manager [facility] Direct Dial: [phone]

## 2019-08-04 NOTE — Chronic Care Management (AMB) (Signed)
  Chronic Care Management   Outreach Note  08/04/2019 Name: Jeffrey Shaw MRN: [id number] DOB: 11-03-1951  Referred by: Loman Brooklyn, FNP Reason for referral : Chronic Care Management (Initial Visit)   An initial unsuccessful telephone outreach was attempted today. The patient was referred to the case management team for assistance with chronic care management and care coordination as well as prescription assistance. His chronic medical conditions include HTN, DM, and Hypothyroidism.   Medication List aspirin EC 81 MG tablet Take by mouth.   atorvastatin 40 MG tablet Commonly known as: LIPITOR Take by mouth.   Bydureon BCise 2 MG/0.85ML [place] Generic drug: Exenatide ER Inject 2 mg into the skin once a week.   famotidine 20 MG tablet Commonly known as: PEPCID Take 20 mg by mouth 2 (two) times daily.   insulin aspart 100 UNIT/ML injection Commonly known as: novoLOG Inject 15-20 Units into the skin 3 (three) times daily before meals. Patient uses home sliding scale   Levemir FlexTouch 100 UNIT/ML Pen Generic drug: Insulin Detemir Inject 40 units at night   levothyroxine 175 MCG tablet Commonly known as: SYNTHROID Take 1 tablet (175 mcg total) by mouth daily.   lisinopril 10 MG tablet Commonly known as: ZESTRIL Take 10 mg by mouth daily.   metoprolol tartrate 25 MG tablet Commonly known as: LOPRESSOR Take 12.5 mg by mouth 2 (two) times daily.   multivitamin tablet Take 1 tablet by mouth daily.   sildenafil 100 MG tablet Commonly known as: VIAGRA TAKE 1 TABLET BY MOUTH ONCE DAILY AS NEEDED FOR ERECTILE DYSFUNCTION       Follow Up Plan: A HIPPA compliant phone message was left for the patient providing contact information and requesting a return call.   The care management team will reach out to the patient again over the next 30 days.   Referral to [place] team to help with Rx assistance due to complexity and potential need for alternate  medication recommendations due to cost and available Rx assistance  Chong Sicilian BSN, RN-BC [place] [facility] Direct Dial: [phone]

## 2019-08-15 DIAGNOSIS — Z1211 Encounter for screening for malignant neoplasm of colon: Secondary | ICD-10-CM | POA: Diagnosis not present

## 2019-08-15 DIAGNOSIS — Z1212 Encounter for screening for malignant neoplasm of rectum: Secondary | ICD-10-CM | POA: Diagnosis not present

## 2019-08-18 ENCOUNTER — Telehealth: Payer: Medicare Other

## 2019-09-21 LAB — COLOGUARD: Cologuard: NEGATIVE

## 2019-10-04 ENCOUNTER — Other Ambulatory Visit: Payer: Self-pay | Admitting: Family Medicine

## 2019-10-04 DIAGNOSIS — Z136 Encounter for screening for cardiovascular disorders: Secondary | ICD-10-CM

## 2019-10-10 DIAGNOSIS — Z23 Encounter for immunization: Secondary | ICD-10-CM | POA: Diagnosis not present

## 2019-10-11 ENCOUNTER — Other Ambulatory Visit: Payer: Self-pay

## 2019-10-11 ENCOUNTER — Ambulatory Visit (HOSPITAL_COMMUNITY)
Admission: RE | Admit: 2019-10-11 | Discharge: 2019-10-11 | Disposition: A | Discharge status: Home / Self Care | Payer: Medicare Other | Source: Ambulatory Visit | Attending: Family Medicine | Admitting: Family Medicine

## 2019-10-11 DIAGNOSIS — Z136 Encounter for screening for cardiovascular disorders: Secondary | ICD-10-CM | POA: Insufficient documentation

## 2019-10-11 DIAGNOSIS — Z87891 Personal history of nicotine dependence: Secondary | ICD-10-CM | POA: Insufficient documentation

## 2019-10-20 ENCOUNTER — Other Ambulatory Visit: Payer: Self-pay | Admitting: Family Medicine

## 2019-10-20 DIAGNOSIS — E119 Type 2 diabetes mellitus without complications: Secondary | ICD-10-CM

## 2019-10-20 MED ORDER — OZEMPIC (0.25 OR 0.5 MG/DOSE) 2 MG/1.5ML ~~LOC~~ SOPN: PEN_INJECTOR | SUBCUTANEOUS | 0 refills | Status: AC

## 2019-10-20 MED ORDER — OZEMPIC (1 MG/DOSE) 2 MG/1.5ML ~~LOC~~ SOPN: 1.0000 mg | PEN_INJECTOR | SUBCUTANEOUS | 8 refills | Status: DC

## 2019-10-25 ENCOUNTER — Telehealth: Payer: Self-pay | Admitting: Family Medicine

## 2019-10-25 NOTE — Telephone Encounter (Signed)
Form faxed

## 2019-11-07 ENCOUNTER — Telehealth: Payer: Self-pay | Admitting: Family Medicine

## 2019-11-08 NOTE — Telephone Encounter (Signed)
Patient aware and verbalizes understanding. 

## 2019-11-08 NOTE — Telephone Encounter (Signed)
He should have a 0.5 mg pen that he will use to give himself 0.25 mg once weekly for the first 4 weeks. As long as he is doing okay with nausea he can then increase to 0.5 mg once weekly for the next 4 weeks. Last, as long as nausea is okay, he can increase to 1 mg once weekly which comes in a different pen. Time of day or relation to meal does not matter.

## 2019-11-08 NOTE — Telephone Encounter (Signed)
He would like for provider to call him to discuss ozempic.  He still has questions about what time of dy, meals with it and other?  Please call him today.

## 2019-11-13 ENCOUNTER — Telehealth: Payer: Self-pay | Admitting: Family Medicine

## 2019-11-13 NOTE — Telephone Encounter (Signed)
Please follow-up on cologuard. If no results are available please reach out to patient to inquire if he received and completed. If not, please encouraged him to do so.

## 2019-11-14 NOTE — Telephone Encounter (Signed)
Patient states his results came back negative.

## 2019-11-16 NOTE — Telephone Encounter (Signed)
I found these on the cologuard website and have printed them to be scanned to the chart.

## 2020-01-04 DIAGNOSIS — E669 Obesity, unspecified: Secondary | ICD-10-CM | POA: Insufficient documentation

## 2020-01-04 NOTE — Progress Notes (Signed)
Assessment & Plan:  1. Uncontrolled type 2 diabetes mellitus with hyperglycemia (HCC) Lab Results  Component Value Date   HGBA1C 7.7 (H) 01/06/2020   HGBA1C 7.2 (H) 07/06/2019   HGBA1C 8.1 (H) 04/07/2016   - Diabetes is not at goal of A1c < 7. - Medications: continue current medications, increase Ozempic to 1 mg once weekly after two more weeks on the 0.5 mg dosage. - Home glucose monitoring: continue ACHS; if patient can get approved for Dexcom we are going to start checking 1 hour before meals and 2 hours after meals to help with adjusting his sliding scale.  - Patient is currently taking a statin. Patient is taking an ACE-inhibitor/ARB.  - Last foot exam: 07/06/2019 - Last diabetic eye exam: this month per patient; record requested from [facility] in [place] - Urine Microalbumin/Creat Ratio: 07/06/2019 - CBC with Differential/Platelet - CMP14+EGFR - Lipid panel - Bayer DCA Hb A1c Waived - Continuous Blood Gluc Receiver (DEXCOM G6 RECEIVER) DEVI; 1 Device by Does not apply route daily.  Dispense: 1 each; Refill: 0 - Continuous Blood Gluc Sensor (DEXCOM G6 SENSOR) MISC; 1 Device by Does not apply route every 30 (thirty) days.  Dispense: 3 each; Refill: 3 - Continuous Blood Gluc Transmit (DEXCOM G6 TRANSMITTER) MISC; 1 Device by Does not apply route every 3 (three) months.  Dispense: 1 each; Refill: 3  2. Obesity (BMI 30.0-34.9) - Continue diet and healthy eating. Patient is losing weight with intermittent fasting. Hoping to continue weight loss as we gain better control of his diabetes and increase the Ozempic.  - CBC with Differential/Platelet  3. Hypothyroidism, unspecified type - Patient has not had labs since his last dose adjustment. Previously uncontrolled.  - CBC with Differential/Platelet - Thyroid Panel With TSH   Return in about 3 months (around 04/05/2020) for DM.  Hendricks Limes, MSN, APRN, FNP-C [facility]  Subjective:    Patient ID: Jeffrey Shaw, male    DOB: Oct 31, 1951, 69 y.o.   MRN: [id number]  Patient Care Team: Loman Brooklyn, FNP as PCP - General (Family Medicine) Earlie Server, MD as Consulting Physician (Orthopedic Surgery) Ilean China, RN as Case Manager   Chief Complaint:  Chief Complaint  Patient presents with  . Medical Management of Chronic Issues  . Diabetes  . Hypothyroidism    HPI: Jeffrey Shaw is a 69 y.o. male presenting on 01/06/2020 for Medical Management of Chronic Issues, Diabetes, and Hypothyroidism  Diabetes: Patient presents for follow up of diabetes. Current symptoms include: none. Known diabetic complications: cardiovascular disease. Medication compliance: yes; he has only had Ozempic for a month. He started with 0.25 mg x2 weeks, then increased to 0.5 mg for the past 2 weeks. He has tolerated well and not had any nausea. Current diet: in general, a "healthy" diet  . Current exercise: aerobics, bicycling, cardiovascular workout on exercise equipment, swimming, walking and weightlifting. Home blood sugar records: patient is checking blood sugars at least 4 times (prior to meals for Novolog sliding scale adjustment and QHS for Levemir) and sometimes more frequently if needed. He finds his blood sugar keeps jumping up > 200 first thing in the morning. He is having lows at night at times where he feels he needs to eat some peanut butter (last time this occurred his blood glucose was 59).  Is he  on ACE inhibitor or angiotensin II receptor blocker? Yes. Is he on a statin? Yes.   Patient's weight is  down from 271 lbs a year ago to 245 lbs today (26 total lbs). He has been doing intermittent fasting from around 6:30 PM to 10:00 AM.    New complaints: None  Social history:  Relevant past medical, surgical, family and social history reviewed and updated as indicated. Interim medical history since our last visit reviewed.  Allergies and medications reviewed and updated.  DATA  REVIEWED: CHART IN EPIC  ROS: Negative unless specifically indicated above in HPI.    Current Outpatient Medications:  .  aspirin EC 81 MG tablet, Take by mouth., Disp: , Rfl:  .  atorvastatin (LIPITOR) 40 MG tablet, Take by mouth., Disp: , Rfl:  .  famotidine (PEPCID) 20 MG tablet, Take 20 mg by mouth 2 (two) times daily., Disp: , Rfl:  .  insulin aspart (NOVOLOG) 100 UNIT/ML injection, Inject 15-20 Units into the skin 3 (three) times daily before meals. Patient uses home sliding scale, Disp: , Rfl:  .  Insulin Detemir (LEVEMIR FLEXTOUCH) 100 UNIT/ML Pen, Inject 25 Units into the skin at bedtime. , Disp: , Rfl:  .  levothyroxine (SYNTHROID) 175 MCG tablet, Take 1 tablet (175 mcg total) by mouth daily., Disp: 30 tablet, Rfl: 1 .  lisinopril (PRINIVIL,ZESTRIL) 10 MG tablet, Take 10 mg by mouth daily.  , Disp: , Rfl:  .  metoprolol tartrate (LOPRESSOR) 25 MG tablet, Take 12.5 mg by mouth 2 (two) times daily. , Disp: , Rfl:  .  Multiple Vitamin (MULTIVITAMIN) tablet, Take 1 tablet by mouth daily.  , Disp: , Rfl:  .  OVER THE COUNTER MEDICATION, Take 1 capsule by mouth 2 (two) times daily. 3-6-9 fish oil, flax seed combo, Disp: , Rfl:  .  Semaglutide, 1 MG/DOSE, (OZEMPIC, 1 MG/DOSE,) 2 MG/1.5ML SOPN, Inject 1 mg into the skin once a week. Start after ramping up with 0.25 mg x4 weeks and then 0.5 mg x4 weeks., Disp: 2 pen, Rfl: 8 .  sildenafil (VIAGRA) 100 MG tablet, TAKE 1 TABLET BY MOUTH ONCE DAILY AS NEEDED FOR ERECTILE DYSFUNCTION, Disp: , Rfl:  .  zinc gluconate 50 MG tablet, Take 50 mg by mouth daily., Disp: , Rfl:  .  Continuous Blood Gluc Receiver (DEXCOM G6 RECEIVER) DEVI, 1 Device by Does not apply route daily., Disp: 1 each, Rfl: 0 .  Continuous Blood Gluc Sensor (DEXCOM G6 SENSOR) MISC, 1 Device by Does not apply route every 30 (thirty) days., Disp: 3 each, Rfl: 3 .  Continuous Blood Gluc Transmit (DEXCOM G6 TRANSMITTER) MISC, 1 Device by Does not apply route every 3 (three) months.,  Disp: 1 each, Rfl: 3   Allergies  Allergen Reactions  . Morphine And Related Nausea Only   Past Medical History:  Diagnosis Date  . Arthritis   . CAD (coronary artery disease)    CABG 01/2008 /  Catheterization, November, 2012, LIMA to the LAD patent,  70% distal left main stenosis involving the proximal circumflex, DES to the left main continuing into the circumflex  . CORONARY ARTERY BYPASS GRAFT, HX OF 01/17/2010   Qualifier: Diagnosis of  By: Ron Parker, MD, Leonidas Romberg Dorinda Hill   . Diabetes mellitus    Dr. Tod Persia, [facility]  . GERD (gastroesophageal reflux disease)   . HTN (hypertension)   . 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 Hyperlipemia   . Hypothyroid   . Primary osteoarthritis of left hip 04/18/2016    Past Surgical  History:  Procedure Laterality Date  . BACK SURGERY    . CARPAL TUNNEL RELEASE     left hand  . CORONARY ANGIOPLASTY    . CORONARY ARTERY BYPASS GRAFT  01/2008   X 6  . INGUINAL HERNIA REPAIR     right  . LEFT HEART CATHETERIZATION WITH CORONARY/GRAFT ANGIOGRAM N/A 10/30/2011   Procedure: LEFT HEART CATHETERIZATION WITH Beatrix Fetters;  Surgeon: Hillary Bow, MD;  Location: [facility] CATH LAB;  Service: Cardiovascular;  Laterality: N/A;  . LUMBAR DISC SURGERY     removed  . PERCUTANEOUS CORONARY STENT INTERVENTION (PCI-S) N/A 11/03/2011   Procedure: PERCUTANEOUS CORONARY STENT INTERVENTION (PCI-S);  Surgeon: Burnell Blanks, MD;  Location: [facility] CATH LAB;  Service: Cardiovascular;  Laterality: N/A;  . TONSILLECTOMY  ~ 1955  . TOTAL HIP ARTHROPLASTY Left 04/18/2016   Procedure: TOTAL HIP ARTHROPLASTY;  Surgeon: Earlie Server, MD;  Location: [place];  Service: Orthopedics;  Laterality: Left;  . URETEROLITHOTOMY Left    with stone basket removal    Social History   Socioeconomic History  . Marital status: Married    Spouse name: SANDRA  . Number of children: Not on file  . Years of education: Not on file   . Highest education level: Not on file  Occupational History  . Occupation: SELF EMPLOYED  Tobacco Use  . Smoking status: Former Smoker    Packs/day: 0.80    Years: 6.00    Pack years: 4.80    Types: Cigarettes    Quit date: 12/22/1968    Years since quitting: 51.0  . Smokeless tobacco: Never Used  Substance and Sexual Activity  . Alcohol use: Yes    Alcohol/week: 2.0 standard drinks    Types: 2 Cans of beer per week  . Drug use: No  . Sexual activity: Not on file  Other Topics Concern  . Not on file  Social History Narrative  . Not on file   Social Determinants of Health   Financial Resource Strain:   . Difficulty of Paying Living Expenses: Not on file  Food Insecurity:   . Worried About Charity fundraiser in the Last Year: Not on file  . Ran Out of Food in the Last Year: Not on file  Transportation Needs:   . Lack of Transportation (Medical): Not on file  . Lack of Transportation (Non-Medical): Not on file  Physical Activity:   . Days of Exercise per Week: Not on file  . Minutes of Exercise per Session: Not on file  Stress:   . Feeling of Stress : Not on file  Social Connections:   . Frequency of Communication with Friends and Family: Not on file  . Frequency of Social Gatherings with Friends and Family: Not on file  . Attends Religious Services: Not on file  . Active Member of Clubs or Organizations: Not on file  . Attends Archivist Meetings: Not on file  . Marital Status: Not on file  Intimate Partner Violence:   . Fear of Current or Ex-Partner: Not on file  . Emotionally Abused: Not on file  . Physically Abused: Not on file  . Sexually Abused: Not on file        Objective:    BP 119/61   Pulse 76   Temp 98.4 F (36.9 C)   Resp 20   Ht '6\' 4"'$  (1.93 m)   Wt 245 lb (111.1 kg)   SpO2 99%   BMI 29.82 kg/m  Physical Exam Vitals reviewed.  Constitutional:      General: He is not in acute distress.    Appearance: Normal appearance. He is  overweight. He is not ill-appearing, toxic-appearing or diaphoretic.  HENT:     Head: Normocephalic and atraumatic.  Eyes:     General: No scleral icterus.       Right eye: No discharge.        Left eye: No discharge.     Conjunctiva/sclera: Conjunctivae normal.  Cardiovascular:     Rate and Rhythm: Normal rate and regular rhythm.     Heart sounds: Normal heart sounds. No murmur. No friction rub. No gallop.   Pulmonary:     Effort: Pulmonary effort is normal. No respiratory distress.     Breath sounds: Normal breath sounds. No stridor. No wheezing, rhonchi or rales.  Musculoskeletal:        General: Normal range of motion.     Cervical back: Normal range of motion.  Skin:    General: Skin is warm and dry.  Neurological:     Mental Status: He is alert and oriented to person, place, and time. Mental status is at baseline.  Psychiatric:        Mood and Affect: Mood normal.        Behavior: Behavior normal.        Thought Content: Thought content normal.        Judgment: Judgment normal.     Lab Results  Component Value Date   TSH 0.216 (L) 07/06/2019   Lab Results  Component Value Date   WBC 8.2 07/06/2019   HGB 14.3 07/06/2019   HCT 42.0 07/06/2019   MCV 91 07/06/2019   PLT 266 07/06/2019   Lab Results  Component Value Date   NA 137 07/06/2019   K 4.6 07/06/2019   CO2 20 07/06/2019   GLUCOSE 165 (H) 07/06/2019   BUN 16 07/06/2019   CREATININE 1.02 07/06/2019   BILITOT 1.5 (H) 07/06/2019   ALKPHOS 84 07/06/2019   AST 29 07/06/2019   ALT 30 07/06/2019   PROT 7.6 07/06/2019   ALBUMIN 4.5 07/06/2019   CALCIUM 10.1 07/06/2019   ANIONGAP 9 04/19/2016   Lab Results  Component Value Date   CHOL 139 10/30/2011   Lab Results  Component Value Date   HDL 56 10/30/2011   Lab Results  Component Value Date   LDLCALC 73 10/30/2011   Lab Results  Component Value Date   TRIG 48 10/30/2011   Lab Results  Component Value Date   CHOLHDL 2.5 10/30/2011   Lab  Results  Component Value Date   HGBA1C 7.7 (H) 01/06/2020

## 2020-01-05 ENCOUNTER — Other Ambulatory Visit: Payer: Self-pay

## 2020-01-06 ENCOUNTER — Encounter: Payer: Self-pay | Admitting: Family Medicine

## 2020-01-06 ENCOUNTER — Ambulatory Visit (INDEPENDENT_AMBULATORY_CARE_PROVIDER_SITE_OTHER): Payer: Medicare Other | Admitting: Family Medicine

## 2020-01-06 VITALS — BP 119/61 | HR 76 | Temp 98.4°F | Resp 20 | Ht 76.0 in | Wt 245.0 lb

## 2020-01-06 DIAGNOSIS — E669 Obesity, unspecified: Secondary | ICD-10-CM | POA: Diagnosis not present

## 2020-01-06 DIAGNOSIS — E1165 Type 2 diabetes mellitus with hyperglycemia: Secondary | ICD-10-CM

## 2020-01-06 DIAGNOSIS — E039 Hypothyroidism, unspecified: Secondary | ICD-10-CM

## 2020-01-06 DIAGNOSIS — E119 Type 2 diabetes mellitus without complications: Secondary | ICD-10-CM | POA: Diagnosis not present

## 2020-01-06 DIAGNOSIS — Z794 Long term (current) use of insulin: Secondary | ICD-10-CM | POA: Diagnosis not present

## 2020-01-06 LAB — BAYER DCA HB A1C WAIVED: HB A1C (BAYER DCA - WAIVED): 7.7 % — ABNORMAL HIGH (ref <7.0)

## 2020-01-06 MED ORDER — DEXCOM G6 TRANSMITTER MISC: 1.0000 | 3 refills | Status: DC

## 2020-01-06 MED ORDER — DEXCOM G6 SENSOR MISC: 1.0000 | 3 refills | Status: DC

## 2020-01-06 MED ORDER — DEXCOM G6 RECEIVER DEVI: 1.0000 | Freq: Every day | 0 refills | Status: DC

## 2020-01-07 LAB — CMP14+EGFR
ALT: 29 IU/L (ref 0–44)
AST: 30 IU/L (ref 0–40)
Albumin/Globulin Ratio: 1.7 (ref 1.2–2.2)
Albumin: 4.2 g/dL (ref 3.8–4.8)
Alkaline Phosphatase: 122 IU/L — ABNORMAL HIGH (ref 39–117)
BUN/Creatinine Ratio: 10 (ref 10–24)
BUN: 12 mg/dL (ref 8–27)
Bilirubin Total: 1.2 mg/dL (ref 0.0–1.2)
CO2: 21 mmol/L (ref 20–29)
Calcium: 9.6 mg/dL (ref 8.6–10.2)
Chloride: 98 mmol/L (ref 96–106)
Creatinine, Ser: 1.2 mg/dL (ref 0.76–1.27)
GFR calc Af Amer: 71 mL/min/{1.73_m2} (ref >59)
GFR calc non Af Amer: 61 mL/min/{1.73_m2} (ref >59)
Globulin, Total: 2.5 g/dL (ref 1.5–4.5)
Glucose: 334 mg/dL — ABNORMAL HIGH (ref 65–99)
Potassium: 4.4 mmol/L (ref 3.5–5.2)
Sodium: 133 mmol/L — ABNORMAL LOW (ref 134–144)
Total Protein: 6.7 g/dL (ref 6.0–8.5)

## 2020-01-07 LAB — CBC WITH DIFFERENTIAL/PLATELET
Basophils Absolute: 0 10*3/uL (ref 0.0–0.2)
Basos: 1 %
EOS (ABSOLUTE): 0.2 10*3/uL (ref 0.0–0.4)
Eos: 3 %
Hematocrit: 41.5 % (ref 37.5–51.0)
Hemoglobin: 14 g/dL (ref 13.0–17.7)
Immature Grans (Abs): 0 10*3/uL (ref 0.0–0.1)
Immature Granulocytes: 0 %
Lymphocytes Absolute: 2 10*3/uL (ref 0.7–3.1)
Lymphs: 30 %
MCH: 30.8 pg (ref 26.6–33.0)
MCHC: 33.7 g/dL (ref 31.5–35.7)
MCV: 91 fL (ref 79–97)
Monocytes Absolute: 0.4 10*3/uL (ref 0.1–0.9)
Monocytes: 6 %
Neutrophils Absolute: 4.1 10*3/uL (ref 1.4–7.0)
Neutrophils: 60 %
Platelets: 285 10*3/uL (ref 150–450)
RBC: 4.54 x10E6/uL (ref 4.14–5.80)
RDW: 12.4 % (ref 11.6–15.4)
WBC: 6.7 10*3/uL (ref 3.4–10.8)

## 2020-01-07 LAB — LIPID PANEL
Chol/HDL Ratio: 2.6 ratio (ref 0.0–5.0)
Cholesterol, Total: 135 mg/dL (ref 100–199)
HDL: 51 mg/dL (ref >39)
LDL Chol Calc (NIH): 69 mg/dL (ref 0–99)
Triglycerides: 76 mg/dL (ref 0–149)
VLDL Cholesterol Cal: 15 mg/dL (ref 5–40)

## 2020-01-07 LAB — THYROID PANEL WITH TSH
Free Thyroxine Index: 2.9 (ref 1.2–4.9)
T3 Uptake Ratio: 30 % (ref 24–39)
T4, Total: 9.5 ug/dL (ref 4.5–12.0)
TSH: 0.569 u[IU]/mL (ref 0.450–4.500)

## 2020-01-13 ENCOUNTER — Telehealth: Payer: Self-pay | Admitting: Family Medicine

## 2020-01-13 DIAGNOSIS — E119 Type 2 diabetes mellitus without complications: Secondary | ICD-10-CM

## 2020-01-13 MED ORDER — OZEMPIC (1 MG/DOSE) 2 MG/1.5ML ~~LOC~~ SOPN: 1.0000 mg | PEN_INJECTOR | SUBCUTANEOUS | 8 refills | Status: DC

## 2020-01-13 NOTE — Telephone Encounter (Signed)
Refill sent to pharmacy.   

## 2020-01-16 ENCOUNTER — Telehealth: Payer: Self-pay | Admitting: *Deleted

## 2020-01-16 NOTE — Telephone Encounter (Signed)
Prior Auth for Ozempic (1mg  dose) 2mg /1.54ml Pen-Injectors--In Process  Key ) 4m    Your information has been submitted to [facility]. [employer] [place] will review the request and notify you of the determination decision directly, typically within 3 business days of your submission and once all necessary information is received.  You will also receive your request decision electronically. To check for an update later, open the request again from your dashboard.  If - [id number] [place] has not responded within the specified timeframe or if you have any questions about your PA submission, contact [employer] [place] directly at [facility]) [phone] or (PDP) [phone].

## 2020-01-17 ENCOUNTER — Other Ambulatory Visit: Payer: Self-pay

## 2020-01-17 ENCOUNTER — Emergency Department (HOSPITAL_COMMUNITY)
Admission: EM | Admit: 2020-01-17 | Discharge: 2020-01-17 | Disposition: A | Discharge status: Home / Self Care | Payer: Medicare Other | Attending: Emergency Medicine | Admitting: Emergency Medicine

## 2020-01-17 ENCOUNTER — Encounter (HOSPITAL_COMMUNITY): Payer: Self-pay

## 2020-01-17 DIAGNOSIS — E119 Type 2 diabetes mellitus without complications: Secondary | ICD-10-CM | POA: Diagnosis not present

## 2020-01-17 DIAGNOSIS — K2900 Acute gastritis without bleeding: Secondary | ICD-10-CM | POA: Diagnosis not present

## 2020-01-17 DIAGNOSIS — R0789 Other chest pain: Secondary | ICD-10-CM | POA: Diagnosis present

## 2020-01-17 DIAGNOSIS — Z951 Presence of aortocoronary bypass graft: Secondary | ICD-10-CM | POA: Diagnosis not present

## 2020-01-17 DIAGNOSIS — I2510 Atherosclerotic heart disease of native coronary artery without angina pectoris: Secondary | ICD-10-CM | POA: Diagnosis not present

## 2020-01-17 DIAGNOSIS — Z87891 Personal history of nicotine dependence: Secondary | ICD-10-CM | POA: Diagnosis not present

## 2020-01-17 DIAGNOSIS — E039 Hypothyroidism, unspecified: Secondary | ICD-10-CM | POA: Diagnosis not present

## 2020-01-17 DIAGNOSIS — Z79899 Other long term (current) drug therapy: Secondary | ICD-10-CM | POA: Diagnosis not present

## 2020-01-17 DIAGNOSIS — I10 Essential (primary) hypertension: Secondary | ICD-10-CM | POA: Diagnosis not present

## 2020-01-17 DIAGNOSIS — Z794 Long term (current) use of insulin: Secondary | ICD-10-CM | POA: Insufficient documentation

## 2020-01-17 DIAGNOSIS — R079 Chest pain, unspecified: Secondary | ICD-10-CM | POA: Diagnosis not present

## 2020-01-17 DIAGNOSIS — Z96642 Presence of left artificial hip joint: Secondary | ICD-10-CM | POA: Insufficient documentation

## 2020-01-17 DIAGNOSIS — K219 Gastro-esophageal reflux disease without esophagitis: Secondary | ICD-10-CM

## 2020-01-17 LAB — CBC
HCT: 42.9 % (ref 39.0–52.0)
Hemoglobin: 14.2 g/dL (ref 13.0–17.0)
MCH: 30.9 pg (ref 26.0–34.0)
MCHC: 33.1 g/dL (ref 30.0–36.0)
MCV: 93.3 fL (ref 80.0–100.0)
Platelets: 243 10*3/uL (ref 150–400)
RBC: 4.6 MIL/uL (ref 4.22–5.81)
RDW: 12.4 % (ref 11.5–15.5)
WBC: 6.9 10*3/uL (ref 4.0–10.5)
nRBC: 0 % (ref 0.0–0.2)

## 2020-01-17 LAB — COMPREHENSIVE METABOLIC PANEL
ALT: 33 U/L (ref 0–44)
AST: 38 U/L (ref 15–41)
Albumin: 4.2 g/dL (ref 3.5–5.0)
Alkaline Phosphatase: 87 U/L (ref 38–126)
Anion gap: 7 (ref 5–15)
BUN: 18 mg/dL (ref 8–23)
CO2: 25 mmol/L (ref 22–32)
Calcium: 10 mg/dL (ref 8.9–10.3)
Chloride: 104 mmol/L (ref 98–111)
Creatinine, Ser: 0.96 mg/dL (ref 0.61–1.24)
GFR calc Af Amer: >60 mL/min (ref >60)
GFR calc non Af Amer: >60 mL/min (ref >60)
Glucose, Bld: 141 mg/dL — ABNORMAL HIGH (ref 70–99)
Potassium: 4.2 mmol/L (ref 3.5–5.1)
Sodium: 136 mmol/L (ref 135–145)
Total Bilirubin: 1.7 mg/dL — ABNORMAL HIGH (ref 0.3–1.2)
Total Protein: 7.4 g/dL (ref 6.5–8.1)

## 2020-01-17 LAB — LIPASE, BLOOD: Lipase: 34 U/L (ref 11–51)

## 2020-01-17 LAB — TROPONIN I (HIGH SENSITIVITY): Troponin I (High Sensitivity): 7 ng/L (ref <18)

## 2020-01-17 MED ORDER — PANTOPRAZOLE SODIUM 40 MG PO TBEC: 40.0000 mg | DELAYED_RELEASE_TABLET | Freq: Every day | ORAL | 0 refills | Status: DC

## 2020-01-17 MED ORDER — PANTOPRAZOLE SODIUM 40 MG PO TBEC
40.0000 mg | DELAYED_RELEASE_TABLET | Freq: Once | ORAL | Status: AC
  Administered 2020-01-17: 40 mg via ORAL
  Filled 2020-01-17: qty 1

## 2020-01-17 NOTE — ED Triage Notes (Signed)
Pt is having chest discomfort. Started 3 days ago. Pt has a lot of indigestion and burping. Pain got worse this morning after drinking coffee. History of 6 heart surgeries.

## 2020-01-17 NOTE — ED Provider Notes (Signed)
[facility] EMERGENCY DEPARTMENT Provider Note   CSN: [id number] Arrival date & time: 01/17/20  1000     History Chief Complaint  Patient presents with  . Chest Pain    Jeffrey Shaw is a 69 y.o. male.  HPI   This patient is a 69 year old male with a known history of coronary disease status post multiple bypasses approximately 12 years ago.  He is a known diabetic and has acid reflux disease for which he takes famotidine.  He states that around 3:00 this morning he woke up with a feeling of acid reflux, belching, burping, feeling of burning sensation in the chest.  The patient reports that this is worse when he is sitting, better when he is up and about, reports that he had been exercising at the [facility] for the last several months and never has any exertional symptoms.  His symptoms became worse this morning after drinking coffee at around 4:00 in the morning, he went to the gym where he usually goes where he works and was on the elliptical trainer, no increasing in symptoms, with lifting weights without increasing in symptoms and has no shortness of breath.  This discomfort does not radiate, it is located in the epigastrium and seems to radiate up into the chest.  He does not drink alcohol, takes occasional anti-inflammatories.  He does use famotidine  Past Medical History:  Diagnosis Date  . Arthritis   . CAD (coronary artery disease)    CABG 01/2008 /  Catheterization, November, 2012, LIMA to the LAD patent,  70% distal left main stenosis involving the proximal circumflex, DES to the left main continuing into the circumflex  . CORONARY ARTERY BYPASS GRAFT, HX OF 01/17/2010   Qualifier: Diagnosis of  By: Myrtis Ser, MD, Ruthann Cancer Lemmie Evens   . Diabetes mellitus    Dr. Nonie Hoyer, [facility]  . GERD (gastroesophageal reflux disease)   . HTN (hypertension)   . 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 Hyperlipemia   . Hypothyroid   . Primary osteoarthritis  of left hip 04/18/2016    Patient Active Problem List   Diagnosis Date Noted  . Obesity (BMI 30.0-34.9) 01/04/2020  . Erectile dysfunction due to arterial insufficiency 07/07/2019  . Hypothyroidism 08/02/2009  . Type 2 diabetes mellitus without complication, with long-term current use of insulin (HCC) 08/02/2009  . HYPERLIPIDEMIA-MIXED 08/02/2009  . Essential hypertension 08/02/2009    Past Surgical History:  Procedure Laterality Date  . BACK SURGERY    . CARPAL TUNNEL RELEASE     left hand  . CORONARY ANGIOPLASTY    . CORONARY ARTERY BYPASS GRAFT  01/2008   X 6  . INGUINAL HERNIA REPAIR     right  . LEFT HEART CATHETERIZATION WITH CORONARY/GRAFT ANGIOGRAM N/A 10/30/2011   Procedure: LEFT HEART CATHETERIZATION WITH Isabel Caprice;  Surgeon: Herby Abraham, MD;  Location: [facility] CATH LAB;  Service: Cardiovascular;  Laterality: N/A;  . LUMBAR DISC SURGERY     removed  . PERCUTANEOUS CORONARY STENT INTERVENTION (PCI-S) N/A 11/03/2011   Procedure: PERCUTANEOUS CORONARY STENT INTERVENTION (PCI-S);  Surgeon: Kathleene Hazel, MD;  Location: [facility] CATH LAB;  Service: Cardiovascular;  Laterality: N/A;  . TONSILLECTOMY  ~ 1955  . TOTAL HIP ARTHROPLASTY Left 04/18/2016   Procedure: TOTAL HIP ARTHROPLASTY;  Surgeon: Frederico Hamman, MD;  Location[facility] OR;  Service: Orthopedics;  Laterality: Left;  . URETEROLITHOTOMY Left  with stone basket removal       Family History  Problem Relation Age of Onset  . Dementia Mother   . Liver disease Mother   . Suicidality Father   . Emphysema Father   . Drug abuse Brother        Clean now  . Rheum arthritis Sister   . Heart attack Maternal Grandmother   . Emphysema Maternal Grandfather   . Stroke Maternal Grandfather   . Stomach cancer Paternal Grandmother   . Diabetes Paternal Aunt   . Diabetes Paternal Aunt     Social History   Tobacco Use  . Smoking status: Former Smoker    Packs/day: 0.80    Years: 6.00    Pack years:  4.80    Types: Cigarettes    Quit date: 12/22/1968    Years since quitting: 51.1  . Smokeless tobacco: Never Used  Substance Use Topics  . Alcohol use: Yes    Alcohol/week: 2.0 standard drinks    Types: 2 Cans of beer per week  . Drug use: No    Home Medications Prior to Admission medications   Medication Sig Start Date End Date Taking? Authorizing Provider  aspirin EC 81 MG tablet Take by mouth.    [provider]  atorvastatin (LIPITOR) 40 MG tablet Take by mouth. 03/09/19   [provider]  Continuous Blood Gluc Receiver (DEXCOM G6 RECEIVER) DEVI 1 Device by Does not apply route daily. 01/06/20   Loman Brooklyn, FNP  Continuous Blood Gluc Sensor (DEXCOM G6 SENSOR) MISC 1 Device by Does not apply route every 30 (thirty) days. 01/06/20   Loman Brooklyn, FNP  Continuous Blood Gluc Transmit (DEXCOM G6 TRANSMITTER) MISC 1 Device by Does not apply route every 3 (three) months. 01/06/20   Loman Brooklyn, FNP  famotidine (PEPCID) 20 MG tablet Take 20 mg by mouth 2 (two) times daily.    [provider]  insulin aspart (NOVOLOG) 100 UNIT/ML injection Inject 15-20 Units into the skin 3 (three) times daily before meals. Patient uses home sliding scale    [provider]  Insulin Detemir (LEVEMIR FLEXTOUCH) 100 UNIT/ML Pen Inject 25 Units into the skin at bedtime.  08/17/18   [provider]  levothyroxine (SYNTHROID) 175 MCG tablet Take 1 tablet (175 mcg total) by mouth daily. 07/08/19   Loman Brooklyn, FNP  lisinopril (PRINIVIL,ZESTRIL) 10 MG tablet Take 10 mg by mouth daily.      [provider]  metoprolol tartrate (LOPRESSOR) 25 MG tablet Take 12.5 mg by mouth 2 (two) times daily.     [provider]  Multiple Vitamin (MULTIVITAMIN) tablet Take 1 tablet by mouth daily.      [provider]  OVER THE COUNTER MEDICATION Take 1 capsule by mouth 2 (two) times daily. 3-6-9 fish oil, flax seed combo    [provider]    pantoprazole (PROTONIX) 40 MG tablet Take 1 tablet (40 mg total) by mouth daily. 01/17/20 02/16/20  Noemi Chapel, MD  Semaglutide, 1 MG/DOSE, (OZEMPIC, 1 MG/DOSE,) 2 MG/1.5ML SOPN Inject 1 mg into the skin once a week. 01/13/20   Loman Brooklyn, FNP  sildenafil (VIAGRA) 100 MG tablet TAKE 1 TABLET BY MOUTH ONCE DAILY AS NEEDED FOR ERECTILE DYSFUNCTION 06/03/19   [provider]  zinc gluconate 50 MG tablet Take 50 mg by mouth daily.    [provider]    Allergies    Morphine and related  Review of  Systems   Review of Systems  All other systems reviewed and are negative.   Physical Exam Updated Vital Signs BP (!) 161/88 (BP Location: Right Arm)   Pulse 69   Temp 98 F (36.7 C) (Oral)   SpO2 100%   Physical Exam Vitals and nursing note reviewed.  Constitutional:      General: He is not in acute distress.    Appearance: He is well-developed.  HENT:     Head: Normocephalic and atraumatic.     Mouth/Throat:     Pharynx: No oropharyngeal exudate.  Eyes:     General: No scleral icterus.       Right eye: No discharge.        Left eye: No discharge.     Conjunctiva/sclera: Conjunctivae normal.     Pupils: Pupils are equal, round, and reactive to light.  Neck:     Thyroid: No thyromegaly.     Vascular: No JVD.  Cardiovascular:     Rate and Rhythm: Normal rate and regular rhythm.     Heart sounds: Normal heart sounds. No murmur. No friction rub. No gallop.   Pulmonary:     Effort: Pulmonary effort is normal. No respiratory distress.     Breath sounds: Normal breath sounds. No wheezing or rales.  Abdominal:     General: Bowel sounds are normal. There is no distension.     Palpations: Abdomen is soft. There is no mass.     Tenderness: There is abdominal tenderness.     Comments: Mild epigastric tenderness, no guarding, no Murphy sign, no other abdominal tenderness  Musculoskeletal:        General: No tenderness. Normal range of motion.     Cervical back:  Normal range of motion and neck supple.  Lymphadenopathy:     Cervical: No cervical adenopathy.  Skin:    General: Skin is warm and dry.     Findings: No erythema or rash.  Neurological:     Mental Status: He is alert.     Coordination: Coordination normal.  Psychiatric:        Behavior: Behavior normal.     ED Results / Procedures / Treatments   Labs (all labs ordered are listed, but only abnormal results are displayed) Labs Reviewed  COMPREHENSIVE METABOLIC PANEL - Abnormal; Notable for the following components:      Result Value   Glucose, Bld 141 (*)    Total Bilirubin 1.7 (*)    All other components within normal limits  CBC  LIPASE, BLOOD  TROPONIN I (HIGH SENSITIVITY)    EKG EKG performed on January 17, 2020 at 10:36 AM.  Rhythm is normal sinus, rate of 79, axis is normal, intervals are normal, ST segments are normal, T waves nonspecific in the inferior and lateral precordial leads.  There is no ST elevation, there is no signs of left ventricular hypertrophy, no significant findings  Radiology No results found.  Procedures Procedures (including critical care time)  Medications Ordered in ED Medications  pantoprazole (PROTONIX) EC tablet 40 mg (40 mg Oral Given 01/17/20 1134)    ED Course  I have reviewed the triage vital signs and the nursing notes.  Pertinent labs & imaging results that were available during my care of the patient were reviewed by me and considered in my medical decision making (see chart for details).    MDM Rules/Calculators/A&P  The patient's exam is reassuring, his EKG reveals no arrhythmia, normal axis, normal intervals, normal ST segments and T waves.  There is no signs of acute ischemia.  A single troponin should be sufficient to rule out acute coronary syndrome, this sounds more like gastritis or possibly pancreatitis or acid reflux disease.  The patient is agreeable to the plan and work-up  11:24 AM Cardiac  monitoring reveals normal sinus rhythm, as reviewed and interpreted by me. Cardiac monitoring was ordered due to epigastric pain and chest discomfort and to monitor patient for dysrhythmia.  This patient continues to feel well, Protonix given, white blood cell count normal, troponin normal, EKG unremarkable and metabolic panel unremarkable as well.  The lipase is normal, this does not seem to be an intra-abdominal pathology other than possible gastritis or peptic ulcer disease.  The patient will be started on Protonix in addition to the famotidine, he is agreeable to the plan.  Final Clinical Impression(s) / ED Diagnoses Final diagnoses:  Acute gastritis without hemorrhage, unspecified gastritis type  Gastroesophageal reflux disease without esophagitis    Rx / DC Orders ED Discharge Orders         Ordered    pantoprazole (PROTONIX) 40 MG tablet  Daily     01/17/20 1252           Eber Hong, MD 01/17/20 1253

## 2020-01-17 NOTE — Discharge Instructions (Signed)
Your testing today reveals no signs of acute heart attack, pancreatitis or gallbladder disease.  There is no signs of lung problems, this may be related to acid reflux or gastritis, please see the attached instructions.  Seek medical exam for severe or worsening symptoms, otherwise follow-up with your doctor this week, your cardiologist this week and return to the emergency department for worsening symptoms.  Start taking Protonix daily

## 2020-01-19 ENCOUNTER — Telehealth: Payer: Self-pay | Admitting: Family Medicine

## 2020-01-19 NOTE — Telephone Encounter (Signed)
Prior Auth for Ozempic-DENIED no documented trial and failure of Metformin product

## 2020-01-19 NOTE — Telephone Encounter (Signed)
I believe patient is getting medication through [employer] prescription assistance.

## 2020-01-19 NOTE — Telephone Encounter (Signed)
See previous telephone encounter for Prior Auth, will close this encounter.

## 2020-01-19 NOTE — Telephone Encounter (Signed)
noted 

## 2020-01-23 ENCOUNTER — Encounter: Payer: Self-pay | Admitting: Family Medicine

## 2020-01-27 ENCOUNTER — Encounter: Payer: Self-pay | Admitting: Family Medicine

## 2020-01-27 NOTE — Telephone Encounter (Signed)
I called [facility] pharmacy regarding prescriptions for receiver, sensor, and transmitter as they were all sent on the same day.  They confirm they did receive all of them so they are not sure why they were.dispensed to the patient and why the patient was told he still needed 2 more parts.  They will get them together right now for the patient to pick up later today.  Then call patient to make him aware of the situation.

## 2020-02-02 ENCOUNTER — Other Ambulatory Visit: Payer: Self-pay | Admitting: Family Medicine

## 2020-02-02 DIAGNOSIS — E039 Hypothyroidism, unspecified: Secondary | ICD-10-CM

## 2020-02-02 MED ORDER — LEVOTHYROXINE SODIUM 175 MCG PO TABS: 175.0000 ug | ORAL_TABLET | Freq: Every day | ORAL | 3 refills | Status: DC

## 2020-02-02 NOTE — Progress Notes (Signed)
Fax received from a advocate for health requesting a prescription for levothyroxine for patient.  They are aware he gets assistance with his generic medications.

## 2020-02-07 ENCOUNTER — Other Ambulatory Visit: Payer: Self-pay | Admitting: Family Medicine

## 2020-02-07 ENCOUNTER — Encounter: Payer: Self-pay | Admitting: Family Medicine

## 2020-02-07 DIAGNOSIS — E1165 Type 2 diabetes mellitus with hyperglycemia: Secondary | ICD-10-CM

## 2020-02-07 MED ORDER — DEXCOM G6 SENSOR MISC: 1.0000 | 3 refills | Status: DC | PRN

## 2020-02-17 ENCOUNTER — Encounter: Payer: Self-pay | Admitting: Family Medicine

## 2020-02-17 DIAGNOSIS — Z794 Long term (current) use of insulin: Secondary | ICD-10-CM

## 2020-02-17 DIAGNOSIS — E119 Type 2 diabetes mellitus without complications: Secondary | ICD-10-CM

## 2020-02-17 MED ORDER — OZEMPIC (1 MG/DOSE) 2 MG/1.5ML ~~LOC~~ SOPN: 1.0000 mg | PEN_INJECTOR | SUBCUTANEOUS | 1 refills | Status: DC

## 2020-02-17 NOTE — Telephone Encounter (Signed)
Have either of you seen these forms.  

## 2020-02-21 MED ORDER — OZEMPIC (1 MG/DOSE) 2 MG/1.5ML ~~LOC~~ SOPN: 1.0000 mg | PEN_INJECTOR | SUBCUTANEOUS | 1 refills | Status: DC

## 2020-02-21 NOTE — Addendum Note (Signed)
Addended by: Gwenlyn Fudge on: 02/21/2020 09:29 AM   Modules accepted: Orders

## 2020-02-24 ENCOUNTER — Other Ambulatory Visit: Payer: Self-pay | Admitting: *Deleted

## 2020-02-24 MED ORDER — PANTOPRAZOLE SODIUM 40 MG PO TBEC: 40.0000 mg | DELAYED_RELEASE_TABLET | Freq: Every day | ORAL | 0 refills | Status: DC

## 2020-02-29 ENCOUNTER — Other Ambulatory Visit: Payer: Self-pay

## 2020-03-01 ENCOUNTER — Encounter: Payer: Self-pay | Admitting: Family Medicine

## 2020-03-01 ENCOUNTER — Ambulatory Visit (INDEPENDENT_AMBULATORY_CARE_PROVIDER_SITE_OTHER): Payer: Medicare Other | Admitting: Family Medicine

## 2020-03-01 VITALS — BP 120/60 | HR 69 | Temp 99.5°F | Ht 76.0 in | Wt 242.0 lb

## 2020-03-01 DIAGNOSIS — Z794 Long term (current) use of insulin: Secondary | ICD-10-CM | POA: Diagnosis not present

## 2020-03-01 DIAGNOSIS — E119 Type 2 diabetes mellitus without complications: Secondary | ICD-10-CM | POA: Diagnosis not present

## 2020-03-01 MED ORDER — TRIAMCINOLONE ACETONIDE 0.1 % EX CREA: 1.0000 "application " | TOPICAL_CREAM | Freq: Two times a day (BID) | CUTANEOUS | 0 refills | Status: DC

## 2020-03-01 NOTE — Progress Notes (Signed)
Assessment & Plan:  1. Type 2 diabetes mellitus without complication, with long-term current use of insulin (HCC) - To continue meal coverage with sliding scale if > 100 as he has been previously. Encouraged to check BS one hour after meals and if > 180 he knows he did not give enough coverage for that meal and should increase it the next time he has that meal. He should not use more insulin at that time if he is still elevated, he should just wait until the next meal. He is also going to decrease Levemir from 20 to 15 units to see if this helps correct some of the lows he has been having since adding Ozempic. Continue Ozempic as prescribed. I will refer to the pharmacist that is starting with [place] next month once she gets here.    Follow up plan: Return as scheduled.  Hendricks Limes, MSN, APRN, FNP-C [facility]  Subjective:   Patient ID: Jeffrey Shaw, male    DOB: [birth year], 69 y.o.   MRN: [id number]  HPI: Jeffrey Shaw is a 69 y.o. male presenting on 03/01/2020 for BS (Patient states his BS has been running up and down.)  Patient reports his blood sugar is all over the place.  He reports that we will go up into the 400s and then come all the way down to the 80s.  He is low he is not doing anything to bring it back up, but then it will shoot back up on its own.  He feels his blood sugars have not been this uncontrolled since he was first diagnosed as a diabetic 30+ years ago.  He also reports feeling tired like he did when he was first diagnosed.  He does lift weights and do cardio on a daily basis.  He is currently on Ozempic 1 mg once weekly, Levemir 20 units at bedtime which was recently decreased from 25 units after starting Ozempic, and NovoLog which he doses 2 different ways.  He gives meal coverage for what he is about to eat as well as 1 unit for every 40 above 100.  There is not a specific amount that he uses at mealtime as he bases it on the meal he is about to  eat.  He has the Dexcom 6 and is able to keep a very close eye on his blood sugars.   ROS: Negative unless specifically indicated above in HPI.   Relevant past medical history reviewed and updated as indicated.   Allergies and medications reviewed and updated.   Current Outpatient Medications:  .  aspirin EC 81 MG tablet, Take by mouth., Disp: , Rfl:  .  atorvastatin (LIPITOR) 40 MG tablet, Take by mouth., Disp: , Rfl:  .  Continuous Blood Gluc Receiver (DEXCOM G6 RECEIVER) DEVI, 1 Device by Does not apply route daily., Disp: 1 each, Rfl: 0 .  Continuous Blood Gluc Sensor (DEXCOM G6 SENSOR) MISC, 1 Device by Does not apply route as needed. Change every 10 days., Disp: 3 each, Rfl: 3 .  Continuous Blood Gluc Transmit (DEXCOM G6 TRANSMITTER) MISC, 1 Device by Does not apply route every 3 (three) months., Disp: 1 each, Rfl: 3 .  famotidine (PEPCID) 20 MG tablet, Take 20 mg by mouth 2 (two) times daily., Disp: , Rfl:  .  insulin aspart (NOVOLOG) 100 UNIT/ML injection, Inject 15-20 Units into the skin 3 (three) times daily before meals. Patient uses home sliding scale, Disp: , Rfl:  .  Insulin Detemir (LEVEMIR FLEXTOUCH) 100 UNIT/ML Pen, Inject 25 Units into the skin at bedtime. , Disp: , Rfl:  .  levothyroxine (SYNTHROID) 175 MCG tablet, Take 1 tablet (175 mcg total) by mouth daily before breakfast., Disp: 90 tablet, Rfl: 3 .  lisinopril (PRINIVIL,ZESTRIL) 10 MG tablet, Take 10 mg by mouth daily.  , Disp: , Rfl:  .  metoprolol tartrate (LOPRESSOR) 25 MG tablet, Take 12.5 mg by mouth 2 (two) times daily. , Disp: , Rfl:  .  Multiple Vitamin (MULTIVITAMIN) tablet, Take 1 tablet by mouth daily.  , Disp: , Rfl:  .  OVER THE COUNTER MEDICATION, Take 1 capsule by mouth 2 (two) times daily. 3-6-9 fish oil, flax seed combo, Disp: , Rfl:  .  pantoprazole (PROTONIX) 40 MG tablet, Take 1 tablet (40 mg total) by mouth daily., Disp: 30 tablet, Rfl: 0 .  Semaglutide, 1 MG/DOSE, (OZEMPIC, 1 MG/DOSE,) 2 MG/1.5ML  SOPN, Inject 1 mg into the skin once a week., Disp: 6 pen, Rfl: 1 .  sildenafil (VIAGRA) 100 MG tablet, TAKE 1 TABLET BY MOUTH ONCE DAILY AS NEEDED FOR ERECTILE DYSFUNCTION, Disp: , Rfl:  .  zinc gluconate 50 MG tablet, Take 50 mg by mouth daily., Disp: , Rfl:  .  triamcinolone cream (KENALOG) 0.1 %, Apply 1 application topically 2 (two) times daily., Disp: 80 g, Rfl: 0  Allergies  Allergen Reactions  . Morphine And Related Nausea Only    Objective:   BP 120/60   Pulse 69   Temp 99.5 F (37.5 C) (Temporal)   Ht 6\' 4"  (1.93 m)   Wt 242 lb (109.8 kg)   SpO2 94%   BMI 29.46 kg/m    Physical Exam Vitals reviewed.  Constitutional:      General: He is not in acute distress.    Appearance: Normal appearance. He is overweight. He is not ill-appearing, toxic-appearing or diaphoretic.  HENT:     Head: Normocephalic and atraumatic.  Eyes:     General: No scleral icterus.       Right eye: No discharge.        Left eye: No discharge.     Conjunctiva/sclera: Conjunctivae normal.  Cardiovascular:     Rate and Rhythm: Normal rate.  Pulmonary:     Effort: Pulmonary effort is normal. No respiratory distress.  Musculoskeletal:        General: Normal range of motion.     Cervical back: Normal range of motion.  Skin:    General: Skin is warm and dry.  Neurological:     Mental Status: He is alert and oriented to person, place, and time. Mental status is at baseline.  Psychiatric:        Mood and Affect: Mood normal.        Behavior: Behavior normal.        Thought Content: Thought content normal.        Judgment: Judgment normal.

## 2020-03-05 ENCOUNTER — Encounter: Payer: Self-pay | Admitting: Family Medicine

## 2020-03-28 ENCOUNTER — Telehealth: Payer: Self-pay | Admitting: Family Medicine

## 2020-03-28 NOTE — Telephone Encounter (Signed)
Left message for patient to call .He is diabetic and is due for A1C so needs to have an office visit.

## 2020-04-04 ENCOUNTER — Other Ambulatory Visit: Payer: Self-pay | Admitting: *Deleted

## 2020-04-04 ENCOUNTER — Other Ambulatory Visit: Payer: Self-pay

## 2020-04-04 ENCOUNTER — Ambulatory Visit (INDEPENDENT_AMBULATORY_CARE_PROVIDER_SITE_OTHER): Payer: Medicare Other | Admitting: *Deleted

## 2020-04-04 DIAGNOSIS — Z0000 Encounter for general adult medical examination without abnormal findings: Secondary | ICD-10-CM

## 2020-04-04 NOTE — Patient Instructions (Signed)
  MEDICARE ANNUAL WELLNESS VISIT Health Maintenance Summary and Written Plan of Care  Mr. Filley ,  Thank you for allowing me to perform your Medicare Annual Wellness Visit and for your ongoing commitment to your health.   Health Maintenance & Immunization History Health Maintenance  Topic Date Due  . OPHTHALMOLOGY EXAM  12/25/2019  . FOOT EXAM  07/05/2020  . HEMOGLOBIN A1C  07/05/2020  . INFLUENZA VACCINE  07/22/2020  . Fecal DNA (Cologuard)  08/14/2022  . TETANUS/TDAP  03/15/2023  . Hepatitis C Screening  Completed  . PNA vac Low Risk Adult  Completed   Immunization History  Administered Date(s) Administered  . Influenza Split 10/07/2017  . Influenza,inj,quad, With Preservative 09/24/2018, 10/10/2019  . Influenza-Unspecified 10/13/2008, 10/06/2014, 10/05/2015, 09/24/2016, 09/24/2018  . Pneumococcal Conjugate-13 10/06/2014  . Pneumococcal Polysaccharide-23 10/09/1997, 01/07/2019  . Td 09/02/2004  . Tdap 03/14/2013  . Zoster 07/29/2013    These are the patient goals that we discussed: Goals Addressed            This Visit's Progress   . AWV       04/04/2020 AWV Goal: Diabetes Management  . Patient will maintain an A1C level below 8.0 . Patient will not develop any diabetic foot complications . Patient will not experience any hypoglycemic episodes over the next 3 months . Patient will notify our office of any CBG readings outside of the provider recommended range by calling [phone] . Patient will adhere to provider recommendations for diabetes management  Patient Self Management Activities . take all medications as prescribed and report any negative side effects . monitor and record blood sugar readings as directed . adhere to a low carbohydrate diet that incorporates lean proteins, vegetables, whole grains, low glycemic fruits . check feet daily noting any sores, cracks, injuries, or callous formations . see PCP or podiatrist if he notices any changes in his  legs, feet, or toenails . Patient will visit PCP and have an A1C level checked every 3 to 6 months as directed  . have a yearly eye exam to monitor for vascular changes associated with diabetes and will request that the report be sent to his pcp.  . consult with his PCP regarding any changes in his health or new or worsening symptoms         This is a list of Health Maintenance Items that are overdue or due now: Health Maintenance Due  Topic Date Due  . OPHTHALMOLOGY EXAM  12/25/2019     Orders/Referrals Placed Today: No orders of the defined types were placed in this encounter.  (Contact our referral department at [phone] if you have not spoken with someone about your referral appointment within the next 5 days)    Follow-up Plan  . Follow-up with Gwenlyn Fudge, FNP as planned . Continue watching Blood sugars closely

## 2020-04-04 NOTE — Progress Notes (Signed)
MEDICARE ANNUAL WELLNESS VISIT  04/04/2020  Telephone Visit Disclaimer This Medicare AWV was conducted by telephone due to national recommendations for restrictions regarding the COVID-19 Pandemic (e.g. social distancing).  I verified, using two identifiers, that I am speaking with Jeffrey Shaw or their authorized healthcare agent. I discussed the limitations, risks, security, and privacy concerns of performing an evaluation and management service by telephone and the potential availability of an in-person appointment in the future. The patient expressed understanding and agreed to proceed.   Subjective:  Jeffrey Shaw is a 69 y.o. male patient of Loman Brooklyn, FNP who had a Medicare Annual Wellness Visit today via telephone. Jeffrey Shaw is Working part time and lives with his wife. He has 0 children. He reports that he is socially active and does interact with friends/family regularly. He is moderately physically active and enjoys working in the yard and cleaning his cars.  Patient Care Team: Loman Brooklyn, FNP as PCP - General (Family Medicine) Earlie Server, MD as Consulting Physician (Orthopedic Surgery) Ilean China, RN as Case Manager  Advanced Directives 04/04/2020 01/17/2020 04/19/2016 04/07/2016 10/29/2011  Does Patient Have a Medical Advance Directive? No No No No Patient does not have advance directive;Patient would like information  Would patient like information on creating a medical advance directive? No - Patient declined - No - patient declined information No - patient declined information -    Hospital Utilization Over the Past 12 Months: # of hospitalizations or ER visits: 0 # of surgeries: 0  Review of Systems    Patient reports that his overall health is better compared to last year.  History obtained from chart review and the patient  Patient Reported Readings (BP, Pulse, CBG, Weight, etc) CBG 104  Pain Assessment Pain : No/denies pain     Current  Medications & Allergies (verified) Allergies as of 04/04/2020      Reactions   Morphine And Related Nausea Only      Medication List       Accurate as of April 04, 2020  9:50 AM. If you have any questions, ask your nurse or doctor.        STOP taking these medications   metoprolol tartrate 25 MG tablet Commonly known as: LOPRESSOR     TAKE these medications   aspirin EC 81 MG tablet Take by mouth.   atorvastatin 40 MG tablet Commonly known as: LIPITOR Take by mouth.   Dexcom G6 Receiver Devi 1 Device by Does not apply route daily.   Dexcom G6 Sensor Misc 1 Device by Does not apply route as needed. Change every 10 days.   Dexcom G6 Transmitter Misc 1 Device by Does not apply route every 3 (three) months.   famotidine 20 MG tablet Commonly known as: PEPCID Take 20 mg by mouth 2 (two) times daily.   insulin aspart 100 UNIT/ML injection Commonly known as: novoLOG Inject 15-20 Units into the skin 3 (three) times daily before meals. Patient uses home sliding scale   Levemir FlexTouch 100 UNIT/ML FlexPen Generic drug: insulin detemir Inject 25 Units into the skin at bedtime.   levothyroxine 175 MCG tablet Commonly known as: SYNTHROID Take 1 tablet (175 mcg total) by mouth daily before breakfast.   lisinopril 10 MG tablet Commonly known as: ZESTRIL Take 10 mg by mouth daily.   multivitamin tablet Take 1 tablet by mouth daily.   OVER THE COUNTER MEDICATION Take 1 capsule by mouth 2 (two) times daily.  3-6-9 fish oil, flax seed combo   Ozempic (1 MG/DOSE) 2 MG/1.5ML Sopn Generic drug: Semaglutide (1 MG/DOSE) Inject 1 mg into the skin once a week.   pantoprazole 40 MG tablet Commonly known as: PROTONIX Take 1 tablet (40 mg total) by mouth daily.   sildenafil 100 MG tablet Commonly known as: VIAGRA TAKE 1 TABLET BY MOUTH ONCE DAILY AS NEEDED FOR ERECTILE DYSFUNCTION   triamcinolone cream 0.1 % Commonly known as: KENALOG Apply 1 application topically 2  (two) times daily.   zinc gluconate 50 MG tablet Take 50 mg by mouth daily.       History (reviewed): Past Medical History:  Diagnosis Date  . Arthritis   . CAD (coronary artery disease)    CABG 01/2008 /  Catheterization, November, 2012, LIMA to the LAD patent,  70% distal left main stenosis involving the proximal circumflex, DES to the left main continuing into the circumflex  . CORONARY ARTERY BYPASS GRAFT, HX OF 01/17/2010   Qualifier: Diagnosis of  By: Myrtis Ser, MD, Ruthann Cancer Lemmie Evens   . Diabetes mellitus    Dr. Nonie Hoyer, [facility]  . GERD (gastroesophageal reflux disease)   . HTN (hypertension)   . 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 Hyperlipemia   . Hypothyroid   . Primary osteoarthritis of left hip 04/18/2016   Past Surgical History:  Procedure Laterality Date  . BACK SURGERY    . CARPAL TUNNEL RELEASE     left hand  . CORONARY ANGIOPLASTY    . CORONARY ARTERY BYPASS GRAFT  01/2008   X 6  . INGUINAL HERNIA REPAIR     right  . LEFT HEART CATHETERIZATION WITH CORONARY/GRAFT ANGIOGRAM N/A 10/30/2011   Procedure: LEFT HEART CATHETERIZATION WITH Isabel Caprice;  Surgeon: Herby Abraham, MD;  Location: [facility] CATH LAB;  Service: Cardiovascular;  Laterality: N/A;  . LUMBAR DISC SURGERY     removed  . PERCUTANEOUS CORONARY STENT INTERVENTION (PCI-S) N/A 11/03/2011   Procedure: PERCUTANEOUS CORONARY STENT INTERVENTION (PCI-S);  Surgeon: Kathleene Hazel, MD;  Location: [facility] CATH LAB;  Service: Cardiovascular;  Laterality: N/A;  . TONSILLECTOMY  ~ 1955  . TOTAL HIP ARTHROPLASTY Left 04/18/2016   Procedure: TOTAL HIP ARTHROPLASTY;  Surgeon: Frederico Hamman, MD;  Location: [facility] OR;  Service: Orthopedics;  Laterality: Left;  . URETEROLITHOTOMY Left    with stone basket removal   Family History  Problem Relation Age of Onset  . Dementia Mother   . Liver disease Mother   . Suicidality Father   . Emphysema Father   . Drug  abuse Brother        Clean now  . Rheum arthritis Sister   . Heart attack Maternal Grandmother   . Emphysema Maternal Grandfather   . Stroke Maternal Grandfather   . Stomach cancer Paternal Grandmother   . Diabetes Paternal Aunt   . Diabetes Paternal Aunt    Social History   Socioeconomic History  . Marital status: Married    Spouse name: SANDRA  . Number of children: 0  . Years of education: Not on file  . Highest education level: 12th grade  Occupational History  . Occupation: SELF EMPLOYED  Tobacco Use  . Smoking status: Former Smoker    Packs/day: 0.80    Years: 6.00    Pack years: 4.80    Types: Cigarettes    Quit date: 12/22/1968    Years since quitting:  51.3  . Smokeless tobacco: Never Used  Substance and Sexual Activity  . Alcohol use: Yes    Alcohol/week: 2.0 standard drinks    Types: 2 Cans of beer per week  . Drug use: No  . Sexual activity: Yes  Other Topics Concern  . Not on file  Social History Narrative  . Not on file   Social Determinants of Health   Financial Resource Strain:   . Difficulty of Paying Living Expenses:   Food Insecurity:   . Worried About Programme researcher, broadcasting/film/video in the Last Year:   . Barista in the Last Year:   Transportation Needs:   . Freight forwarder (Medical):   Marland Kitchen Lack of Transportation (Non-Medical):   Physical Activity:   . Days of Exercise per Week:   . Minutes of Exercise per Session:   Stress:   . Feeling of Stress :   Social Connections:   . Frequency of Communication with Friends and Family:   . Frequency of Social Gatherings with Friends and Family:   . Attends Religious Services:   . Active Member of Clubs or Organizations:   . Attends Banker Meetings:   Marland Kitchen Marital Status:     Activities of Daily Living In your present state of health, do you have any difficulty performing the following activities: 04/04/2020  Hearing? N  Vision? N  Comment Wears glasses  Difficulty concentrating or  making decisions? N  Walking or climbing stairs? N  Dressing or bathing? N  Doing errands, shopping? N  Preparing Food and eating ? N  Using the Toilet? N  In the past six months, have you accidently leaked urine? N  Do you have problems with loss of bowel control? N  Managing your Medications? N  Managing your Finances? N  Housekeeping or managing your Housekeeping? N  Some recent data might be hidden    Patient Education/ Literacy How often do you need to have someone help you when you read instructions, pamphlets, or other written materials from your doctor or pharmacy?: 1 - Never What is the last grade level you completed in school?: 12th  Exercise Current Exercise Habits: Structured exercise class, Time (Minutes): 45, Frequency (Times/Week): 5, Weekly Exercise (Minutes/Week): 225, Intensity: Moderate, Exercise limited by: None identified  Diet Patient reports consuming 2 meals a day and 0 snack(s) a day Patient reports that his primary diet is: Diabetic Patient reports that she does have regular access to food.   Depression Screen PHQ 2/9 Scores 04/04/2020 03/01/2020 01/06/2020 07/06/2019  PHQ - 2 Score 0 0 0 0     Fall Risk Fall Risk  04/04/2020 03/01/2020 01/06/2020 07/06/2019  Falls in the past year? 0 0 0 0     Objective:  [name] seemed alert and oriented and he participated appropriately during our telephone visit.  Blood Pressure Weight BMI  BP Readings from Last 3 Encounters:  03/01/20 120/60  01/17/20 (!) 141/67  01/06/20 119/61   Wt Readings from Last 3 Encounters:  03/01/20 242 lb (109.8 kg)  01/06/20 245 lb (111.1 kg)  07/06/19 254 lb 9.6 oz (115.5 kg)   BMI Readings from Last 1 Encounters:  03/01/20 29.46 kg/m    *Unable to obtain current vital signs, weight, and BMI due to telephone visit type  Hearing/Vision  . Hermes did not seem to have difficulty with hearing/understanding during the telephone conversation . Reports that he has had a formal  eye exam by  an eye care professional within the past year . Reports that he has not had a formal hearing evaluation within the past year *Unable to fully assess hearing and vision during telephone visit type  Cognitive Function: 6CIT Screen 04/04/2020  What Year? 0 points  What month? 0 points  What time? 0 points  Count back from 20 0 points  Months in reverse 0 points  Repeat phrase 0 points  Total Score 0   (Normal:0-7, Significant for Dysfunction: >8)  Normal Cognitive Function Screening: Yes   Immunization & Health Maintenance Record Immunization History  Administered Date(s) Administered  . Influenza Split 10/07/2017  . Influenza,inj,quad, With Preservative 09/24/2018, 10/10/2019  . Influenza-Unspecified 10/13/2008, 10/06/2014, 10/05/2015, 09/24/2016, 09/24/2018  . Pneumococcal Conjugate-13 10/06/2014  . Pneumococcal Polysaccharide-23 10/09/1997, 01/07/2019  . Td 09/02/2004  . Tdap 03/14/2013  . Zoster 07/29/2013    Health Maintenance  Topic Date Due  . OPHTHALMOLOGY EXAM  12/25/2019  . FOOT EXAM  07/05/2020  . HEMOGLOBIN A1C  07/05/2020  . INFLUENZA VACCINE  07/22/2020  . Fecal DNA (Cologuard)  08/14/2022  . TETANUS/TDAP  03/15/2023  . Hepatitis C Screening  Completed  . PNA vac Low Risk Adult  Completed       Assessment  This is a routine wellness examination for [name].  Health Maintenance: Due or Overdue Health Maintenance Due  Topic Date Due  . OPHTHALMOLOGY EXAM  12/25/2019   Record request sent   JAXTIN RAIMONDO does not need a referral for Community Assistance: Care Management:   no Social Work:    no Prescription Assistance:  no Nutrition/Diabetes Education:  no   Plan:  Personalized Goals Goals Addressed   None    Personalized Health Maintenance & Screening Recommendations  Eye exam record request sent to [facility]   Lung Cancer Screening Recommended: no (Low Dose CT Chest recommended if Age 22-80 years, 30 pack-year  currently smoking OR have quit w/in past 15 years) Hepatitis C Screening recommended: not applicable HIV Screening recommended: no  Advanced Directives: Written information was not prepared per patient's request.  Referrals & Orders No orders of the defined types were placed in this encounter.   Follow-up Plan . Follow-up with Gwenlyn Fudge, FNP as planned . Continue watching Blood sugars closely   I have personally reviewed and noted the following in the patient's chart:   . Medical and social history . Use of alcohol, tobacco or illicit drugs  . Current medications and supplements . Functional ability and status . Nutritional status . Physical activity . Advanced directives . List of other physicians . Hospitalizations, surgeries, and ER visits in previous 12 months . Vitals . Screenings to include cognitive, depression, and falls . Referrals and appointments  In addition, I have reviewed and discussed with [name] certain preventive protocols, quality metrics, and best practice recommendations. A written personalized care plan for preventive services as well as general preventive health recommendations is available and can be mailed to the patient at his request.      Billee Cashing, LPN  02/[id number]

## 2020-04-05 ENCOUNTER — Encounter: Payer: Self-pay | Admitting: Family Medicine

## 2020-04-05 ENCOUNTER — Ambulatory Visit (INDEPENDENT_AMBULATORY_CARE_PROVIDER_SITE_OTHER): Payer: Medicare Other | Admitting: Family Medicine

## 2020-04-05 VITALS — BP 131/73 | HR 67 | Temp 97.6°F | Ht 76.0 in | Wt 237.2 lb

## 2020-04-05 DIAGNOSIS — I10 Essential (primary) hypertension: Secondary | ICD-10-CM

## 2020-04-05 DIAGNOSIS — E669 Obesity, unspecified: Secondary | ICD-10-CM

## 2020-04-05 DIAGNOSIS — E119 Type 2 diabetes mellitus without complications: Secondary | ICD-10-CM

## 2020-04-05 DIAGNOSIS — Z794 Long term (current) use of insulin: Secondary | ICD-10-CM

## 2020-04-05 DIAGNOSIS — E66811 Obesity, class 1: Secondary | ICD-10-CM

## 2020-04-05 LAB — BAYER DCA HB A1C WAIVED: HB A1C (BAYER DCA - WAIVED): 7.1 % — ABNORMAL HIGH (ref <7.0)

## 2020-04-05 NOTE — Progress Notes (Signed)
Assessment & Plan:  1. Type 2 diabetes mellitus without complication, with long-term current use of insulin (HCC) Lab Results  Component Value Date   HGBA1C 7.1 (H) 04/05/2020   HGBA1C 7.7 (H) 01/06/2020   HGBA1C 7.2 (H) 07/06/2019  - Diabetes is not at goal of A1c < 7, but is improving. - Medications: continue current medications, with a decrease in Levemir to 35 units.  - Home glucose monitoring: continue using Dexcom. - Patient is currently taking a statin. Patient is taking an ACE-inhibitor/ARB.  - Last foot exam: 07/06/2019 - Last diabetic eye exam: UTD per patient; record requested from [facility] in [place]. - Urine Microalbumin/Creat Ratio: 07/06/2019 - Bayer DCA Hb A1c Waived - CMP14+EGFR - Lipid panel - Patient is also going to see our clinical pharmacist going forward.   2. Essential hypertension - Well controlled on current regimen.   3. Obesity (BMI 30.0-34.9) - Patient has done well with weight loss doing intermittent fasting, exercising, eating healthy, and using Ozempic for diabetes.    Return in about 3 months (around 07/05/2020) for DM.  Hendricks Limes, MSN, APRN, FNP-C [facility]  Subjective:    Patient ID: Jeffrey Shaw, male    DOB: [birth year], 69 y.o.   MRN: [id number]  Patient Care Team: Loman Brooklyn, FNP as PCP - General (Family Medicine) Earlie Server, MD as Consulting Physician (Orthopedic Surgery) Ilean China, RN as Case Manager   Chief Complaint:  Chief Complaint  Patient presents with  . Diabetes    3 month follow up    HPI: Jeffrey Shaw is a 69 y.o. male presenting on 04/05/2020 for Diabetes (3 month follow up)  Diabetes: Patient presents for follow up of diabetes. Current symptoms include: none. Known diabetic complications: cardiovascular disease. Medication compliance: yes. Current diet: in general, a "healthy" diet, patient also does intermittent fasting from 6 PM to 10 AM. Current  exercise: aerobics, bicycling, cardiovascular workout on exercise equipment, swimming, walking and weightlifting. Home blood sugar records:  Patient has a Dexcom 6 to help him keep up with his blood sugars.  He is still all over the place.  The last time he was here he was advised to decrease his Levemir, but he states this made things worse that he actually went back up on it to 40 units.  He is taking 2 to 3 units of NovoLog before bed no matter what his blood sugar is as he reports he continues to climb into the night.  This morning when he woke up his blood sugar was 104, before leaving the house he had dropped down to 45 and had to use some of his gel, which brought him back up to 184.  Yesterday evening when he got home from work his blood sugar was 243.  He decided to watch it and not do anything and it went all the way down to 65 at which time he had to take some more gel.  He has changed his work hours and is now going in at 7 instead of 5 to allow him to get a more decent night of rest.  Is he  on ACE inhibitor or angiotensin II receptor blocker? Yes. Is he on a statin? Yes.    New complaints: None  Social history:  Relevant past medical, surgical, family and social history reviewed and updated as indicated. Interim medical history since our last visit reviewed.  Allergies and medications reviewed and updated.  DATA REVIEWED: CHART IN EPIC  ROS: Negative unless specifically indicated above in HPI.    Current Outpatient Medications:  .  aspirin EC 81 MG tablet, Take by mouth., Disp: , Rfl:  .  atorvastatin (LIPITOR) 40 MG tablet, Take by mouth., Disp: , Rfl:  .  Continuous Blood Gluc Receiver (DEXCOM G6 RECEIVER) DEVI, 1 Device by Does not apply route daily., Disp: 1 each, Rfl: 0 .  Continuous Blood Gluc Sensor (DEXCOM G6 SENSOR) MISC, 1 Device by Does not apply route as needed. Change every 10 days., Disp: 3 each, Rfl: 3 .  Continuous Blood Gluc Transmit (DEXCOM G6 TRANSMITTER) MISC,  1 Device by Does not apply route every 3 (three) months., Disp: 1 each, Rfl: 3 .  famotidine (PEPCID) 20 MG tablet, Take 20 mg by mouth 2 (two) times daily., Disp: , Rfl:  .  insulin aspart (NOVOLOG) 100 UNIT/ML injection, Inject 15-20 Units into the skin 3 (three) times daily before meals. Patient uses home sliding scale, Disp: , Rfl:  .  Insulin Detemir (LEVEMIR FLEXTOUCH) 100 UNIT/ML Pen, Inject 25 Units into the skin at bedtime. , Disp: , Rfl:  .  levothyroxine (SYNTHROID) 175 MCG tablet, Take 1 tablet (175 mcg total) by mouth daily before breakfast., Disp: 90 tablet, Rfl: 3 .  lisinopril (PRINIVIL,ZESTRIL) 10 MG tablet, Take 10 mg by mouth daily.  , Disp: , Rfl:  .  Multiple Vitamin (MULTIVITAMIN) tablet, Take 1 tablet by mouth daily.  , Disp: , Rfl:  .  OVER THE COUNTER MEDICATION, Take 1 capsule by mouth 2 (two) times daily. 3-6-9 fish oil, flax seed combo, Disp: , Rfl:  .  Semaglutide, 1 MG/DOSE, (OZEMPIC, 1 MG/DOSE,) 2 MG/1.5ML SOPN, Inject 1 mg into the skin once a week., Disp: 6 pen, Rfl: 1 .  sildenafil (VIAGRA) 100 MG tablet, TAKE 1 TABLET BY MOUTH ONCE DAILY AS NEEDED FOR ERECTILE DYSFUNCTION, Disp: , Rfl:  .  triamcinolone cream (KENALOG) 0.1 %, Apply 1 application topically 2 (two) times daily., Disp: 80 g, Rfl: 0 .  zinc gluconate 50 MG tablet, Take 50 mg by mouth daily., Disp: , Rfl:  .  pantoprazole (PROTONIX) 40 MG tablet, Take 1 tablet (40 mg total) by mouth daily., Disp: 30 tablet, Rfl: 0   Allergies  Allergen Reactions  . Morphine And Related Nausea Only   Past Medical History:  Diagnosis Date  . Arthritis   . CAD (coronary artery disease)    CABG 01/2008 /  Catheterization, November, 2012, LIMA to the LAD patent,  70% distal left main stenosis involving the proximal circumflex, DES to the left main continuing into the circumflex  . CORONARY ARTERY BYPASS GRAFT, HX OF 01/17/2010   Qualifier: Diagnosis of  By: Ron Parker, MD, Leonidas Romberg Dorinda Hill   . Diabetes mellitus    Dr.  Tod Persia, [facility]  . GERD (gastroesophageal reflux disease)   . HTN (hypertension)   . 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 Hyperlipemia   . Hypothyroid   . Primary osteoarthritis of left hip 04/18/2016    Past Surgical History:  Procedure Laterality Date  . BACK SURGERY    . CARPAL TUNNEL RELEASE     left hand  . CORONARY ANGIOPLASTY    . CORONARY ARTERY BYPASS GRAFT  01/2008   X 6  . INGUINAL HERNIA REPAIR     right  . LEFT HEART CATHETERIZATION WITH CORONARY/GRAFT ANGIOGRAM  N/A 10/30/2011   Procedure: LEFT HEART CATHETERIZATION WITH Beatrix Fetters;  Surgeon: Hillary Bow, MD;  Location: [facility] CATH LAB;  Service: Cardiovascular;  Laterality: N/A;  . LUMBAR DISC SURGERY     removed  . PERCUTANEOUS CORONARY STENT INTERVENTION (PCI-S) N/A 11/03/2011   Procedure: PERCUTANEOUS CORONARY STENT INTERVENTION (PCI-S);  Surgeon: Burnell Blanks, MD;  Location: [facility] CATH LAB;  Service: Cardiovascular;  Laterality: N/A;  . TONSILLECTOMY  ~ 1955  . TOTAL HIP ARTHROPLASTY Left 04/18/2016   Procedure: TOTAL HIP ARTHROPLASTY;  Surgeon: Earlie Server, MD;  Location: [place];  Service: Orthopedics;  Laterality: Left;  . URETEROLITHOTOMY Left    with stone basket removal    Social History   Socioeconomic History  . Marital status: Married    Spouse name: SANDRA  . Number of children: 0  . Years of education: Not on file  . Highest education level: 12th grade  Occupational History  . Occupation: SELF EMPLOYED  Tobacco Use  . Smoking status: Former Smoker    Packs/day: 0.80    Years: 6.00    Pack years: 4.80    Types: Cigarettes    Quit date: 12/22/1968    Years since quitting: 51.3  . Smokeless tobacco: Never Used  Substance and Sexual Activity  . Alcohol use: Yes    Alcohol/week: 2.0 standard drinks    Types: 2 Cans of beer per week  . Drug use: No  . Sexual activity: Yes  Other Topics Concern  . Not on file  Social History  Narrative  . Not on file   Social Determinants of Health   Financial Resource Strain:   . Difficulty of Paying Living Expenses:   Food Insecurity:   . Worried About Charity fundraiser in the Last Year:   . Arboriculturist in the Last Year:   Transportation Needs:   . Film/video editor (Medical):   Marland Kitchen Lack of Transportation (Non-Medical):   Physical Activity:   . Days of Exercise per Week:   . Minutes of Exercise per Session:   Stress:   . Feeling of Stress :   Social Connections:   . Frequency of Communication with Friends and Family:   . Frequency of Social Gatherings with Friends and Family:   . Attends Religious Services:   . Active Member of Clubs or Organizations:   . Attends Archivist Meetings:   Marland Kitchen Marital Status:   Intimate Partner Violence:   . Fear of Current or Ex-Partner:   . Emotionally Abused:   Marland Kitchen Physically Abused:   . Sexually Abused:         Objective:    BP 131/73   Pulse 67   Temp 97.6 F (36.4 C) (Temporal)   Ht '6\' 4"'$  (1.93 m)   Wt 237 lb 3.2 oz (107.6 kg)   SpO2 98%   BMI 28.87 kg/m   Wt Readings from Last 3 Encounters:  04/05/20 237 lb 3.2 oz (107.6 kg)  03/01/20 242 lb (109.8 kg)  01/06/20 245 lb (111.1 kg)    Physical Exam Vitals reviewed.  Constitutional:      General: He is not in acute distress.    Appearance: Normal appearance. He is overweight. He is not ill-appearing, toxic-appearing or diaphoretic.  HENT:     Head: Normocephalic and atraumatic.  Eyes:     General: No scleral icterus.       Right eye: No discharge.  Left eye: No discharge.     Conjunctiva/sclera: Conjunctivae normal.  Cardiovascular:     Rate and Rhythm: Normal rate and regular rhythm.     Heart sounds: Normal heart sounds. No murmur. No friction rub. No gallop.   Pulmonary:     Effort: Pulmonary effort is normal. No respiratory distress.     Breath sounds: Normal breath sounds. No stridor. No wheezing, rhonchi or rales.    Musculoskeletal:        General: Normal range of motion.     Cervical back: Normal range of motion.  Skin:    General: Skin is warm and dry.  Neurological:     Mental Status: He is alert and oriented to person, place, and time. Mental status is at baseline.  Psychiatric:        Mood and Affect: Mood normal.        Behavior: Behavior normal.        Thought Content: Thought content normal.        Judgment: Judgment normal.     Lab Results  Component Value Date   TSH 0.569 01/06/2020   Lab Results  Component Value Date   WBC 6.9 01/17/2020   HGB 14.2 01/17/2020   HCT 42.9 01/17/2020   MCV 93.3 01/17/2020   PLT 243 01/17/2020   Lab Results  Component Value Date   NA 137 04/05/2020   K 4.1 04/05/2020   CO2 23 04/05/2020   GLUCOSE 149 (H) 04/05/2020   BUN 16 04/05/2020   CREATININE 1.04 04/05/2020   BILITOT 1.1 04/05/2020   ALKPHOS 68 04/05/2020   AST 32 04/05/2020   ALT 27 04/05/2020   PROT 6.8 04/05/2020   ALBUMIN 4.4 04/05/2020   CALCIUM 9.4 04/05/2020   ANIONGAP 7 01/17/2020   Lab Results  Component Value Date   CHOL 134 04/05/2020   Lab Results  Component Value Date   HDL 51 04/05/2020   Lab Results  Component Value Date   LDLCALC 71 04/05/2020   Lab Results  Component Value Date   TRIG 52 04/05/2020   Lab Results  Component Value Date   CHOLHDL 2.6 04/05/2020   Lab Results  Component Value Date   HGBA1C 7.1 (H) 04/05/2020

## 2020-04-06 LAB — LIPID PANEL
Chol/HDL Ratio: 2.6 ratio (ref 0.0–5.0)
Cholesterol, Total: 134 mg/dL (ref 100–199)
HDL: 51 mg/dL (ref >39)
LDL Chol Calc (NIH): 71 mg/dL (ref 0–99)
Triglycerides: 52 mg/dL (ref 0–149)
VLDL Cholesterol Cal: 12 mg/dL (ref 5–40)

## 2020-04-06 LAB — CMP14+EGFR
ALT: 27 IU/L (ref 0–44)
AST: 32 IU/L (ref 0–40)
Albumin/Globulin Ratio: 1.8 (ref 1.2–2.2)
Albumin: 4.4 g/dL (ref 3.8–4.8)
Alkaline Phosphatase: 68 IU/L (ref 39–117)
BUN/Creatinine Ratio: 15 (ref 10–24)
BUN: 16 mg/dL (ref 8–27)
Bilirubin Total: 1.1 mg/dL (ref 0.0–1.2)
CO2: 23 mmol/L (ref 20–29)
Calcium: 9.4 mg/dL (ref 8.6–10.2)
Chloride: 102 mmol/L (ref 96–106)
Creatinine, Ser: 1.04 mg/dL (ref 0.76–1.27)
GFR calc Af Amer: 84 mL/min/{1.73_m2} (ref >59)
GFR calc non Af Amer: 73 mL/min/{1.73_m2} (ref >59)
Globulin, Total: 2.4 g/dL (ref 1.5–4.5)
Glucose: 149 mg/dL — ABNORMAL HIGH (ref 65–99)
Potassium: 4.1 mmol/L (ref 3.5–5.2)
Sodium: 137 mmol/L (ref 134–144)
Total Protein: 6.8 g/dL (ref 6.0–8.5)

## 2020-04-09 ENCOUNTER — Encounter: Payer: Self-pay | Admitting: Family Medicine

## 2020-04-12 ENCOUNTER — Encounter: Payer: Self-pay | Admitting: Family Medicine

## 2020-04-12 MED ORDER — SILDENAFIL CITRATE 100 MG PO TABS: 100.0000 mg | ORAL_TABLET | Freq: Every day | ORAL | 2 refills | Status: DC | PRN

## 2020-04-18 ENCOUNTER — Other Ambulatory Visit: Payer: Self-pay | Admitting: Family Medicine

## 2020-04-18 MED ORDER — ATORVASTATIN CALCIUM 40 MG PO TABS: 40.0000 mg | ORAL_TABLET | Freq: Every day | ORAL | 1 refills | Status: DC

## 2020-04-18 MED ORDER — LISINOPRIL 10 MG PO TABS: 10.0000 mg | ORAL_TABLET | Freq: Every day | ORAL | 1 refills | Status: DC

## 2020-04-18 NOTE — Telephone Encounter (Signed)
  Prescription Request  04/18/2020  What is the name of the medication or equipment? ATORVASTATIN CALCUIM, LISINOPRIL, AND SIMVASTATIN  Have you contacted your pharmacy to request a refill? (if applicable) THE PHARMACY CALLED [place]   Which pharmacy would you like this sent to? [facility]   Patient notified that their request is being sent to the clinical staff for review and that they should receive a response within 2 business days.

## 2020-04-18 NOTE — Telephone Encounter (Signed)
Historical meds. Last OV 04/05/20 Pharmacy is asking for RF on Simvastatin as well which is not on med list Please advise

## 2020-04-18 NOTE — Telephone Encounter (Signed)
Patient should not be on two statin medications. Atorvastatin refilled.

## 2020-05-28 ENCOUNTER — Telehealth: Payer: Self-pay | Admitting: Pharmacist

## 2020-05-28 NOTE — Telephone Encounter (Signed)
A1C 7.1%   Patient reports low BG at random times of the day (HAS DEXCOM G6)  T2DM (TREATED LIKE T1DM) per previous PCP  GETTING 8 HRS SLEEP  INTERMITTENT FASTING 6PM-10:30AM  He has lost over 38LBS in the past two years by working out and making better food choices (also on Ozempic)  He wakes up at 4:30am, Works at [employer] -->only drinks coffee in the AM  BG STARTS TO RISE AT 1030-11AM  EATS LUNCH AFTER WORK AROUND 12  BG DROPS AROUND 2-230PM PM (IF WORKING IN THE YARD)  EATS DINNER AT 4:30PM   Insulin regimen  LEVEMIR 35 UNITS NIGHTLY (DOSE WAS HIGHER AROUND 50UNITS)  NOVOLOG ONLY TWICE DAILY SINCE ONLY EATING 2 MEALS  PATIENTS SLIDING SCALE:  1 UNIT OF INSULIN FOR EVERY 40 POINTS  REPORTS BG 95 AT BEDTIME (8PM) --HAS BEEN AS LOW AS 49 WILL DISCUSS CASE WITH PCP AND ENDO

## 2020-06-01 ENCOUNTER — Ambulatory Visit: Payer: Medicare Other | Admitting: Pharmacist

## 2020-06-27 ENCOUNTER — Other Ambulatory Visit: Payer: Self-pay | Admitting: Family Medicine

## 2020-06-27 DIAGNOSIS — E1165 Type 2 diabetes mellitus with hyperglycemia: Secondary | ICD-10-CM

## 2020-06-30 ENCOUNTER — Telehealth: Payer: Self-pay | Admitting: Family Medicine

## 2020-06-30 DIAGNOSIS — E11649 Type 2 diabetes mellitus with hypoglycemia without coma: Secondary | ICD-10-CM

## 2020-06-30 NOTE — Telephone Encounter (Signed)
Patient is in need of a referral to endocrinology in [place].

## 2020-07-05 ENCOUNTER — Other Ambulatory Visit: Payer: Self-pay

## 2020-07-05 ENCOUNTER — Encounter: Payer: Self-pay | Admitting: Family Medicine

## 2020-07-05 ENCOUNTER — Ambulatory Visit (INDEPENDENT_AMBULATORY_CARE_PROVIDER_SITE_OTHER): Payer: Medicare Other | Admitting: Family Medicine

## 2020-07-05 VITALS — BP 129/68 | HR 71 | Temp 98.1°F | Ht 76.0 in | Wt 234.0 lb

## 2020-07-05 DIAGNOSIS — E039 Hypothyroidism, unspecified: Secondary | ICD-10-CM | POA: Diagnosis not present

## 2020-07-05 DIAGNOSIS — E782 Mixed hyperlipidemia: Secondary | ICD-10-CM

## 2020-07-05 DIAGNOSIS — Z794 Long term (current) use of insulin: Secondary | ICD-10-CM | POA: Diagnosis not present

## 2020-07-05 DIAGNOSIS — I10 Essential (primary) hypertension: Secondary | ICD-10-CM

## 2020-07-05 DIAGNOSIS — B351 Tinea unguium: Secondary | ICD-10-CM | POA: Diagnosis not present

## 2020-07-05 DIAGNOSIS — I2510 Atherosclerotic heart disease of native coronary artery without angina pectoris: Secondary | ICD-10-CM

## 2020-07-05 DIAGNOSIS — E11649 Type 2 diabetes mellitus with hypoglycemia without coma: Secondary | ICD-10-CM | POA: Diagnosis not present

## 2020-07-05 DIAGNOSIS — N5201 Erectile dysfunction due to arterial insufficiency: Secondary | ICD-10-CM

## 2020-07-05 LAB — BAYER DCA HB A1C WAIVED: HB A1C (BAYER DCA - WAIVED): 6.5 % (ref <7.0)

## 2020-07-05 NOTE — Progress Notes (Signed)
Assessment & Plan:  1. Type 2 diabetes mellitus with hypoglycemia without coma, with long-term current use of insulin (HCC) Lab Results  Component Value Date   HGBA1C 7.1 (H) 04/05/2020   HGBA1C 7.7 (H) 01/06/2020   HGBA1C 7.2 (H) 07/06/2019     - Diabetes is at goal of A1c < 7. - Medications: continue current medications - Home glucose monitoring: continue with Dexcom monitoring - Patient is currently taking a statin. Patient is taking an ACE-inhibitor/ARB.  - Last foot exam: 07/05/2020 - Last diabetic eye exam: January 2021 - requesting record again - Urine Microalbumin/Creat Ratio: 07/06/2019 - Bayer DCA Hb A1c Waived - CMP14+EGFR  2. Hypothyroidism, unspecified type - Well controlled on current regimen.  - TSH  3. Onychomycosis - Patient is going to try OTC treatment. He does not wish to do oral medications.  4. Essential hypertension - Well controlled on current regimen.   5. Erectile dysfunction due to arterial insufficiency - Well controlled on current regimen.   6. Mixed hyperlipidemia - Well controlled on current regimen.   7. Coronary artery disease involving native coronary artery of native heart without angina pectoris - On statin medication.    Return in about 6 months (around 01/05/2021) for annual physical.  Hendricks Limes, MSN, APRN, FNP-C [facility]  Subjective:    Patient ID: Jeffrey Shaw, male    DOB: [birth year], 69 y.o.   MRN: [id number]  Patient Care Team: Loman Brooklyn, FNP as PCP - General (Family Medicine) Earlie Server, MD as Consulting Physician (Orthopedic Surgery) Ilean China, RN as Case Manager   Chief Complaint:  Chief Complaint  Patient presents with  . Diabetes    3 month follow up on chronic medical conditions  . Nail Problem    Left grate toe. Patient states it been there a year    HPI: MARLENE PFLUGER is a 69 y.o. male presenting on 07/05/2020 for Diabetes (3 month follow up on chronic  medical conditions) and Nail Problem (Left grate toe. Patient states it been there a year)  Diabetes: Patient presents for follow up of diabetes. Current symptoms include: hyperglycemia and hypoglycemia . Known diabetic complications: cardiovascular disease. Medication compliance: yes. Current diet: in general, a "healthy" diet  . Current exercise: aerobics, bicycling, cardiovascular workout on exercise equipment, swimming, walking and weightlifting. Home blood sugar records: has the [facility] system. Is still having highs and lows that make him symptomatic. Is he  on ACE inhibitor or angiotensin II receptor blocker? Yes. Is he on a statin? Yes.   A1c 6.5 today - improved from 7.1 three months ago.   Lab Results  Component Value Date   LDLCALC 71 04/05/2020   CREATININE 1.04 04/05/2020     New complaints: Patient has a nail fungus on his left great toe.   Social history:  Relevant past medical, surgical, family and social history reviewed and updated as indicated. Interim medical history since our last visit reviewed.  Allergies and medications reviewed and updated.  DATA REVIEWED: CHART IN EPIC  ROS: Negative unless specifically indicated above in HPI.    Current Outpatient Medications:  .  aspirin EC 81 MG tablet, Take by mouth., Disp: , Rfl:  .  atorvastatin (LIPITOR) 40 MG tablet, Take 1 tablet (40 mg total) by mouth daily., Disp: 90 tablet, Rfl: 1 .  Continuous Blood Gluc Receiver (DEXCOM G6 RECEIVER) DEVI, 1 Device by Does not apply route daily., Disp: 1 each, Rfl: 0 .  Continuous Blood Gluc Sensor (DEXCOM G6 SENSOR) MISC, USE 1 SENSOR EVERY 10 DAYS, Disp: 3 each, Rfl: 0 .  Continuous Blood Gluc Transmit (DEXCOM G6 TRANSMITTER) MISC, 1 Device by Does not apply route every 3 (three) months., Disp: 1 each, Rfl: 3 .  famotidine (PEPCID) 20 MG tablet, Take 20 mg by mouth 2 (two) times daily., Disp: , Rfl:  .  insulin aspart (NOVOLOG) 100 UNIT/ML injection, Inject 15-20 Units into the  skin 3 (three) times daily before meals. Patient uses home sliding scale, Disp: , Rfl:  .  Insulin Detemir (LEVEMIR FLEXTOUCH) 100 UNIT/ML Pen, Inject 30 Units into the skin at bedtime. , Disp: , Rfl:  .  levothyroxine (SYNTHROID) 175 MCG tablet, Take 1 tablet (175 mcg total) by mouth daily before breakfast., Disp: 90 tablet, Rfl: 3 .  lisinopril (ZESTRIL) 10 MG tablet, Take 1 tablet (10 mg total) by mouth daily., Disp: 90 tablet, Rfl: 1 .  Multiple Vitamin (MULTIVITAMIN) tablet, Take 1 tablet by mouth daily.  , Disp: , Rfl:  .  OVER THE COUNTER MEDICATION, Take 1 capsule by mouth 2 (two) times daily. 3-6-9 fish oil, flax seed combo, Disp: , Rfl:  .  Semaglutide, 1 MG/DOSE, (OZEMPIC, 1 MG/DOSE,) 2 MG/1.5ML SOPN, Inject 1 mg into the skin once a week., Disp: 6 pen, Rfl: 1 .  sildenafil (VIAGRA) 100 MG tablet, Take 1 tablet (100 mg total) by mouth daily as needed for erectile dysfunction., Disp: 30 tablet, Rfl: 2 .  triamcinolone cream (KENALOG) 0.1 %, Apply 1 application topically 2 (two) times daily., Disp: 80 g, Rfl: 0 .  zinc gluconate 50 MG tablet, Take 50 mg by mouth daily., Disp: , Rfl:  .  pantoprazole (PROTONIX) 40 MG tablet, Take 1 tablet (40 mg total) by mouth daily., Disp: 30 tablet, Rfl: 0   Allergies  Allergen Reactions  . Morphine And Related Nausea Only   Past Medical History:  Diagnosis Date  . Arthritis   . CAD (coronary artery disease)    CABG 01/2008 /  Catheterization, November, 2012, LIMA to the LAD patent,  70% distal left main stenosis involving the proximal circumflex, DES to the left main continuing into the circumflex  . CORONARY ARTERY BYPASS GRAFT, HX OF 01/17/2010   Qualifier: Diagnosis of  By: Ron Parker, MD, Leonidas Romberg Dorinda Hill   . Diabetes mellitus    Dr. Tod Persia, [facility]  . GERD (gastroesophageal reflux disease)   . HTN (hypertension)   . 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 Hyperlipemia   . Hypothyroid   . Primary  osteoarthritis of left hip 04/18/2016    Past Surgical History:  Procedure Laterality Date  . BACK SURGERY    . CARPAL TUNNEL RELEASE     left hand  . CORONARY ANGIOPLASTY    . CORONARY ARTERY BYPASS GRAFT  01/2008   X 6  . INGUINAL HERNIA REPAIR     right  . LEFT HEART CATHETERIZATION WITH CORONARY/GRAFT ANGIOGRAM N/A 10/30/2011   Procedure: LEFT HEART CATHETERIZATION WITH Beatrix Fetters;  Surgeon: Hillary Bow, MD;  Location: [facility] CATH LAB;  Service: Cardiovascular;  Laterality: N/A;  . LUMBAR DISC SURGERY     removed  . PERCUTANEOUS CORONARY STENT INTERVENTION (PCI-S) N/A 11/03/2011   Procedure: PERCUTANEOUS CORONARY STENT INTERVENTION (PCI-S);  Surgeon: Burnell Blanks, MD;  Location: [facility] CATH LAB;  Service: Cardiovascular;  Laterality: N/A;  . TONSILLECTOMY  ~  1955  . TOTAL HIP ARTHROPLASTY Left 04/18/2016   Procedure: TOTAL HIP ARTHROPLASTY;  Surgeon: Frederico Hamman, MD;  Location[facility] OR;  Service: Orthopedics;  Laterality: Left;  . URETEROLITHOTOMY Left    with stone basket removal    Social History   Socioeconomic History  . Marital status: Married    Spouse name: SANDRA  . Number of children: 0  . Years of education: Not on file  . Highest education level: 12th grade  Occupational History  . Occupation: SELF EMPLOYED  Tobacco Use  . Smoking status: Former Smoker    Packs/day: 0.80    Years: 6.00    Pack years: 4.80    Types: Cigarettes    Quit date: 12/22/1968    Years since quitting: 51.5  . Smokeless tobacco: Never Used  Vaping Use  . Vaping Use: Never used  Substance and Sexual Activity  . Alcohol use: Yes    Alcohol/week: 2.0 standard drinks    Types: 2 Cans of beer per week  . Drug use: No  . Sexual activity: Yes  Other Topics Concern  . Not on file  Social History Narrative  . Not on file   Social Determinants of Health   Financial Resource Strain:   . Difficulty of Paying Living Expenses:   Food Insecurity:   . Worried About  Programme researcher, broadcasting/film/video in the Last Year:   . Barista in the Last Year:   Transportation Needs:   . Freight forwarder (Medical):   Marland Kitchen Lack of Transportation (Non-Medical):   Physical Activity:   . Days of Exercise per Week:   . Minutes of Exercise per Session:   Stress:   . Feeling of Stress :   Social Connections:   . Frequency of Communication with Friends and Family:   . Frequency of Social Gatherings with Friends and Family:   . Attends Religious Services:   . Active Member of Clubs or Organizations:   . Attends Banker Meetings:   Marland Kitchen Marital Status:   Intimate Partner Violence:   . Fear of Current or Ex-Partner:   . Emotionally Abused:   Marland Kitchen Physically Abused:   . Sexually Abused:         Objective:    BP 129/68   Pulse 71   Temp 98.1 F (36.7 C) (Temporal)   Ht 6\' 4"  (1.93 m)   Wt 234 lb (106.1 kg)   SpO2 96%   BMI 28.48 kg/m   Wt Readings from Last 3 Encounters:  07/05/20 234 lb (106.1 kg)  04/05/20 237 lb 3.2 oz (107.6 kg)  03/01/20 242 lb (109.8 kg)    Physical Exam Vitals reviewed.  Constitutional:      General: He is not in acute distress.    Appearance: Normal appearance. He is overweight. He is not ill-appearing, toxic-appearing or diaphoretic.  HENT:     Head: Normocephalic and atraumatic.  Eyes:     General: No scleral icterus.       Right eye: No discharge.        Left eye: No discharge.     Conjunctiva/sclera: Conjunctivae normal.  Cardiovascular:     Rate and Rhythm: Normal rate and regular rhythm.     Heart sounds: Normal heart sounds. No murmur heard.  No friction rub. No gallop.   Pulmonary:     Effort: Pulmonary effort is normal. No respiratory distress.     Breath sounds: Normal breath sounds. No stridor. No  wheezing, rhonchi or rales.  Musculoskeletal:        General: Normal range of motion.     Cervical back: Normal range of motion.  Feet:     Left foot:     Toenail Condition: Fungal disease  present. Skin:    General: Skin is warm and dry.  Neurological:     Mental Status: He is alert and oriented to person, place, and time. Mental status is at baseline.  Psychiatric:        Mood and Affect: Mood normal.        Behavior: Behavior normal.        Thought Content: Thought content normal.        Judgment: Judgment normal.    Diabetic Foot Exam - Simple   Simple Foot Form Diabetic Foot exam was performed with the following findings: Yes 07/05/2020  2:06 PM  Visual Inspection No deformities, no ulcerations, no other skin breakdown bilaterally: Yes Sensation Testing Intact to touch and monofilament testing bilaterally: Yes Pulse Check Posterior Tibialis and Dorsalis pulse intact bilaterally: Yes Comments     Lab Results  Component Value Date   TSH 0.569 01/06/2020   Lab Results  Component Value Date   WBC 6.9 01/17/2020   HGB 14.2 01/17/2020   HCT 42.9 01/17/2020   MCV 93.3 01/17/2020   PLT 243 01/17/2020   Lab Results  Component Value Date   NA 137 04/05/2020   K 4.1 04/05/2020   CO2 23 04/05/2020   GLUCOSE 149 (H) 04/05/2020   BUN 16 04/05/2020   CREATININE 1.04 04/05/2020   BILITOT 1.1 04/05/2020   ALKPHOS 68 04/05/2020   AST 32 04/05/2020   ALT 27 04/05/2020   PROT 6.8 04/05/2020   ALBUMIN 4.4 04/05/2020   CALCIUM 9.4 04/05/2020   ANIONGAP 7 01/17/2020   Lab Results  Component Value Date   CHOL 134 04/05/2020   Lab Results  Component Value Date   HDL 51 04/05/2020   Lab Results  Component Value Date   LDLCALC 71 04/05/2020   Lab Results  Component Value Date   TRIG 52 04/05/2020   Lab Results  Component Value Date   CHOLHDL 2.6 04/05/2020   Lab Results  Component Value Date   HGBA1C 7.1 (H) 04/05/2020

## 2020-07-06 ENCOUNTER — Encounter: Payer: Self-pay | Admitting: Family Medicine

## 2020-07-06 DIAGNOSIS — E039 Hypothyroidism, unspecified: Secondary | ICD-10-CM

## 2020-07-06 LAB — CMP14+EGFR
ALT: 27 IU/L (ref 0–44)
AST: 27 IU/L (ref 0–40)
Albumin/Globulin Ratio: 1.9 (ref 1.2–2.2)
Albumin: 4.4 g/dL (ref 3.8–4.8)
Alkaline Phosphatase: 69 IU/L (ref 48–121)
BUN/Creatinine Ratio: 11 (ref 10–24)
BUN: 13 mg/dL (ref 8–27)
Bilirubin Total: 1.5 mg/dL — ABNORMAL HIGH (ref 0.0–1.2)
CO2: 22 mmol/L (ref 20–29)
Calcium: 9.5 mg/dL (ref 8.6–10.2)
Chloride: 100 mmol/L (ref 96–106)
Creatinine, Ser: 1.18 mg/dL (ref 0.76–1.27)
GFR calc Af Amer: 72 mL/min/{1.73_m2} (ref >59)
GFR calc non Af Amer: 62 mL/min/{1.73_m2} (ref >59)
Globulin, Total: 2.3 g/dL (ref 1.5–4.5)
Glucose: 131 mg/dL — ABNORMAL HIGH (ref 65–99)
Potassium: 4.2 mmol/L (ref 3.5–5.2)
Sodium: 135 mmol/L (ref 134–144)
Total Protein: 6.7 g/dL (ref 6.0–8.5)

## 2020-07-06 LAB — TSH: TSH: 6.5 u[IU]/mL — ABNORMAL HIGH (ref 0.450–4.500)

## 2020-07-07 MED ORDER — LEVOTHYROXINE SODIUM 200 MCG PO TABS: 200.0000 ug | ORAL_TABLET | ORAL | 2 refills | Status: DC

## 2020-07-07 MED ORDER — LEVOTHYROXINE SODIUM 175 MCG PO TABS: ORAL_TABLET | ORAL | 3 refills | Status: DC

## 2020-07-27 ENCOUNTER — Other Ambulatory Visit: Payer: Self-pay | Admitting: Family Medicine

## 2020-07-27 DIAGNOSIS — E1165 Type 2 diabetes mellitus with hyperglycemia: Secondary | ICD-10-CM

## 2020-08-06 ENCOUNTER — Ambulatory Visit: Payer: Medicare Other | Admitting: "Endocrinology

## 2020-08-06 ENCOUNTER — Other Ambulatory Visit: Payer: Self-pay

## 2020-08-06 ENCOUNTER — Encounter: Payer: Self-pay | Admitting: "Endocrinology

## 2020-08-06 VITALS — BP 136/58 | HR 64 | Ht 76.0 in | Wt 236.2 lb

## 2020-08-06 DIAGNOSIS — E1159 Type 2 diabetes mellitus with other circulatory complications: Secondary | ICD-10-CM | POA: Diagnosis not present

## 2020-08-06 MED ORDER — METFORMIN HCL ER 500 MG PO TB24: 500.0000 mg | ORAL_TABLET | Freq: Every day | ORAL | 1 refills | Status: DC

## 2020-08-06 NOTE — Patient Instructions (Signed)

## 2020-08-06 NOTE — Progress Notes (Signed)
Endocrinology Consult Note       08/06/2020, 6:25 PM   Subjective:    Patient ID: Jeffrey Shaw, male    DOB: 02-04-1951.  Jeffrey Shaw is being seen in consultation for management of currently uncontrolled symptomatic diabetes requested by  Gwenlyn Fudge, FNP.   Past Medical History:  Diagnosis Date  . Arthritis   . CAD (coronary artery disease)    CABG 01/2008 /  Catheterization, November, 2012, LIMA to the LAD patent,  70% distal left main stenosis involving the proximal circumflex, DES to the left main continuing into the circumflex  . CORONARY ARTERY BYPASS GRAFT, HX OF 01/17/2010   Qualifier: Diagnosis of  By: Myrtis Ser, MD, Ruthann Cancer Lemmie Evens   . Diabetes mellitus    Dr. Nonie Hoyer, [facility]  . GERD (gastroesophageal reflux disease)   . HTN (hypertension)   . 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 Hyperlipemia   . Hypothyroid   . Primary osteoarthritis of left hip 04/18/2016    Past Surgical History:  Procedure Laterality Date  . BACK SURGERY    . CARPAL TUNNEL RELEASE     left hand  . CORONARY ANGIOPLASTY    . CORONARY ARTERY BYPASS GRAFT  01/2008   X 6  . INGUINAL HERNIA REPAIR     right  . LEFT HEART CATHETERIZATION WITH CORONARY/GRAFT ANGIOGRAM N/A 10/30/2011   Procedure: LEFT HEART CATHETERIZATION WITH Isabel Caprice;  Surgeon: Herby Abraham, MD;  Location: [facility] CATH LAB;  Service: Cardiovascular;  Laterality: N/A;  . LUMBAR DISC SURGERY     removed  . PERCUTANEOUS CORONARY STENT INTERVENTION (PCI-S) N/A 11/03/2011   Procedure: PERCUTANEOUS CORONARY STENT INTERVENTION (PCI-S);  Surgeon: Kathleene Hazel, MD;  Location: [facility] CATH LAB;  Service: Cardiovascular;  Laterality: N/A;  . TONSILLECTOMY  ~ 1955  . TOTAL HIP ARTHROPLASTY Left 04/18/2016   Procedure: TOTAL HIP ARTHROPLASTY;  Surgeon: Frederico Hamman, MD;  Location: [facility] OR;   Service: Orthopedics;  Laterality: Left;  . URETEROLITHOTOMY Left    with stone basket removal    Social History   Socioeconomic History  . Marital status: Married    Spouse name: SANDRA  . Number of children: 0  . Years of education: Not on file  . Highest education level: 12th grade  Occupational History  . Occupation: SELF EMPLOYED  Tobacco Use  . Smoking status: Former Smoker    Packs/day: 0.80    Years: 6.00    Pack years: 4.80    Types: Cigarettes    Quit date: 12/22/1968    Years since quitting: 51.6  . Smokeless tobacco: Never Used  Vaping Use  . Vaping Use: Never used  Substance and Sexual Activity  . Alcohol use: Yes    Alcohol/week: 2.0 standard drinks    Types: 2 Cans of beer per week  . Drug use: No  . Sexual activity: Yes  Other Topics Concern  . Not on file  Social History Narrative  . Not on file   Social Determinants of Health   Financial  Resource Strain:   . Difficulty of Paying Living Expenses:   Food Insecurity:   . Worried About Programme researcher, broadcasting/film/video in the Last Year:   . Barista in the Last Year:   Transportation Needs:   . Freight forwarder (Medical):   Marland Kitchen Lack of Transportation (Non-Medical):   Physical Activity:   . Days of Exercise per Week:   . Minutes of Exercise per Session:   Stress:   . Feeling of Stress :   Social Connections:   . Frequency of Communication with Friends and Family:   . Frequency of Social Gatherings with Friends and Family:   . Attends Religious Services:   . Active Member of Clubs or Organizations:   . Attends Banker Meetings:   Marland Kitchen Marital Status:     Family History  Problem Relation Age of Onset  . Dementia Mother   . Liver disease Mother   . Suicidality Father   . Emphysema Father   . Cancer Father   . Drug abuse Brother        Clean now  . Rheum arthritis Sister   . Heart attack Maternal Grandmother   . Emphysema Maternal Grandfather   . Stroke Maternal Grandfather   .  Stomach cancer Paternal Grandmother   . Diabetes Paternal Aunt   . Diabetes Paternal Aunt     Outpatient Encounter Medications as of 08/06/2020  Medication Sig  . aspirin EC 81 MG tablet Take by mouth.  Marland Kitchen atorvastatin (LIPITOR) 40 MG tablet Take 1 tablet (40 mg total) by mouth daily.  . Continuous Blood Gluc Receiver (DEXCOM G6 RECEIVER) DEVI 1 Device by Does not apply route daily.  . Continuous Blood Gluc Sensor (DEXCOM G6 SENSOR) MISC USE ONE SENSOR EVERY 10 DAYS  . Continuous Blood Gluc Transmit (DEXCOM G6 TRANSMITTER) MISC 1 Device by Does not apply route every 3 (three) months.  . famotidine (PEPCID) 20 MG tablet Take 20 mg by mouth 2 (two) times daily.  . insulin aspart (NOVOLOG) 100 UNIT/ML injection Inject 10-16 Units into the skin 3 (three) times daily before meals.  . Insulin Detemir (LEVEMIR FLEXTOUCH) 100 UNIT/ML Pen Inject 30 Units into the skin at bedtime.   Marland Kitchen levothyroxine (SYNTHROID) 175 MCG tablet Take 175 mcg by mouth 6 out of 7 days of the week. On the other day, take 200 mcg.  . lisinopril (ZESTRIL) 10 MG tablet Take 1 tablet (10 mg total) by mouth daily.  . metFORMIN (GLUCOPHAGE XR) 500 MG 24 hr tablet Take 1 tablet (500 mg total) by mouth daily with breakfast.  . Multiple Vitamin (MULTIVITAMIN) tablet Take 1 tablet by mouth daily.    Marland Kitchen OVER THE COUNTER MEDICATION Take 1 capsule by mouth 2 (two) times daily. 3-6-9 fish oil, flax seed combo  . pantoprazole (PROTONIX) 40 MG tablet Take 1 tablet (40 mg total) by mouth daily.  . Semaglutide, 1 MG/DOSE, (OZEMPIC, 1 MG/DOSE,) 2 MG/1.5ML SOPN Inject 1 mg into the skin once a week.  . sildenafil (VIAGRA) 100 MG tablet Take 1 tablet (100 mg total) by mouth daily as needed for erectile dysfunction.  Marland Kitchen zinc gluconate 50 MG tablet Take 50 mg by mouth daily.  . [DISCONTINUED] levothyroxine (SYNTHROID) 200 MCG tablet Take 1 tablet (200 mcg total) by mouth once a week.  . [DISCONTINUED] triamcinolone cream (KENALOG) 0.1 % Apply 1  application topically 2 (two) times daily.   No facility-administered encounter medications on file as of 08/06/2020.  ALLERGIES: Allergies  Allergen Reactions  . Morphine And Related Nausea Only    VACCINATION STATUS: Immunization History  Administered Date(s) Administered  . Influenza Split 10/07/2017  . Influenza,inj,quad, With Preservative 09/24/2018, 10/10/2019  . Influenza-Unspecified 10/13/2008, 10/06/2014, 10/05/2015, 09/24/2016, 09/24/2018  . Pneumococcal Conjugate-13 10/06/2014  . Pneumococcal Polysaccharide-23 10/09/1997, 01/07/2019  . Td 09/02/2004  . Tdap 03/14/2013  . Zoster 07/29/2013    Diabetes Jeffrey Shaw presents for his initial diabetic visit. Jeffrey Shaw has type 2 diabetes mellitus. Onset time: She was diagnosed at approximate age of 34 years. His disease course has been improving. There are no hypoglycemic associated symptoms. Pertinent negatives for hypoglycemia include no confusion, headaches, pallor or seizures. There are no diabetic associated symptoms. Pertinent negatives for diabetes include no chest pain, no fatigue, no polydipsia, no polyphagia, no polyuria and no weakness. There are no hypoglycemic complications. Symptoms are stable. Diabetic complications include heart disease. Risk factors for coronary artery disease include dyslipidemia, diabetes mellitus, family history, male sex and hypertension. Current diabetic treatments: Jeffrey Shaw is currently on Levemir 30 units nightly, NovoLog 15-20 units 3 times daily AC, Ozempic 1 mg subcutaneously weekly. Jeffrey Shaw is compliant with treatment all of the time. His weight is fluctuating minimally. Jeffrey Shaw is following a generally unhealthy diet. When asked about meal planning, Jeffrey Shaw reported none. Jeffrey Shaw has not had a previous visit with a dietitian. Jeffrey Shaw participates in exercise daily. His home blood glucose trend is decreasing steadily. (Jeffrey Shaw currently uses a freestyle libre device, monitoring adequately.  His blood glucose readings are in target about 90%  of the time.  His recent A1c was 6.5% on July 05, 2020.) An ACE inhibitor/angiotensin II receptor blocker is being taken. Eye exam is current.  Hyperlipidemia This is a chronic problem. The current episode started more than 1 year ago. The problem is controlled. Exacerbating diseases include diabetes. Pertinent negatives include no chest pain, myalgias or shortness of breath. Risk factors for coronary artery disease include diabetes mellitus, dyslipidemia, male sex, hypertension and family history.  Hypertension This is a chronic problem. The current episode started more than 1 year ago. Pertinent negatives include no chest pain, headaches, neck pain, palpitations or shortness of breath. Risk factors for coronary artery disease include dyslipidemia, diabetes mellitus, male gender and family history. Past treatments include ACE inhibitors. Hypertensive end-organ damage includes CAD/MI.     Review of Systems  Constitutional: Negative for chills, fatigue, fever and unexpected weight change.  HENT: Negative for dental problem, mouth sores and trouble swallowing.   Eyes: Negative for visual disturbance.  Respiratory: Negative for cough, choking, chest tightness, shortness of breath and wheezing.   Cardiovascular: Negative for chest pain, palpitations and leg swelling.  Gastrointestinal: Negative for abdominal distention, abdominal pain, constipation, diarrhea, nausea and vomiting.  Endocrine: Negative for polydipsia, polyphagia and polyuria.  Genitourinary: Negative for dysuria, flank pain, hematuria and urgency.  Musculoskeletal: Negative for back pain, gait problem, myalgias and neck pain.  Skin: Negative for pallor, rash and wound.  Neurological: Negative for seizures, syncope, weakness, numbness and headaches.  Psychiatric/Behavioral: Negative for confusion and dysphoric mood.    Objective:    Vitals with BMI 08/06/2020 07/05/2020 04/05/2020  Height 6\' 4"  6\' 4"  -  Weight 236 lbs 3 oz 234 lbs -   BMI 28.76 28.5 -  Systolic 136 129 132  Diastolic 58 68 73  Pulse 64 71 -    BP (!) 136/58   Pulse 64   Ht 6\' 4"  (1.93 m)   Wt 236 lb 3.2  oz (107.1 kg)   BMI 28.75 kg/m   Wt Readings from Last 3 Encounters:  08/06/20 236 lb 3.2 oz (107.1 kg)  07/05/20 234 lb (106.1 kg)  04/05/20 237 lb 3.2 oz (107.6 kg)     Physical Exam Constitutional:      General: Jeffrey Shaw is not in acute distress.    Appearance: Jeffrey Shaw is well-developed.  HENT:     Head: Normocephalic and atraumatic.  Neck:     Thyroid: No thyromegaly.     Trachea: No tracheal deviation.  Cardiovascular:     Rate and Rhythm: Normal rate.     Pulses:          Dorsalis pedis pulses are 1+ on the right side and 1+ on the left side.       Posterior tibial pulses are 1+ on the right side and 1+ on the left side.     Heart sounds: Normal heart sounds, S1 normal and S2 normal. No murmur heard.  No gallop.   Pulmonary:     Effort: Pulmonary effort is normal. No respiratory distress.     Breath sounds: No wheezing.  Abdominal:     General: There is no distension.     Tenderness: There is no abdominal tenderness. There is no guarding.  Musculoskeletal:     Right shoulder: No swelling or deformity.     Cervical back: Normal range of motion and neck supple.  Skin:    General: Skin is warm and dry.     Findings: No rash.     Nails: There is no clubbing.  Neurological:     Mental Status: Jeffrey Shaw is alert and oriented to person, place, and time.     Cranial Nerves: No cranial nerve deficit.     Sensory: No sensory deficit.     Gait: Gait normal.     Deep Tendon Reflexes: Reflexes are normal and symmetric.  Psychiatric:        Speech: Speech normal.        Behavior: Behavior normal. Behavior is cooperative.        Thought Content: Thought content normal.        Judgment: Judgment normal.       CMP ( most recent) CMP     Component Value Date/Time   NA 135 07/05/2020 1336   K 4.2 07/05/2020 1336   CL 100 07/05/2020 1336    CO2 22 07/05/2020 1336   GLUCOSE 131 (H) 07/05/2020 1336   GLUCOSE 141 (H) 01/17/2020 1142   BUN 13 07/05/2020 1336   CREATININE 1.18 07/05/2020 1336   CALCIUM 9.5 07/05/2020 1336   PROT 6.7 07/05/2020 1336   ALBUMIN 4.4 07/05/2020 1336   AST 27 07/05/2020 1336   ALT 27 07/05/2020 1336   ALKPHOS 69 07/05/2020 1336   BILITOT 1.5 (H) 07/05/2020 1336   GFRNONAA 62 07/05/2020 1336   GFRAA 72 07/05/2020 1336     Diabetic Labs (most recent): Lab Results  Component Value Date   HGBA1C 6.5 07/05/2020   HGBA1C 7.1 (H) 04/05/2020   HGBA1C 7.7 (H) 01/06/2020     Lipid Panel ( most recent) Lipid Panel     Component Value Date/Time   CHOL 134 04/05/2020 1133   TRIG 52 04/05/2020 1133   HDL 51 04/05/2020 1133   CHOLHDL 2.6 04/05/2020 1133   CHOLHDL 2.5 10/30/2011 0520   VLDL 10 10/30/2011 0520   LDLCALC 71 04/05/2020 1133   LABVLDL 12 04/05/2020 1133  Results for MALAKHI, MARKWOOD (MRN [id number]) as of 08/07/2020 09:49  Ref. Range 01/06/2020 11:10 07/05/2020 13:36  TSH Latest Ref Range: 0.450 - 4.500 uIU/mL 0.569 6.500 (H)  Thyroxine (T4) Latest Ref Range: 4.5 - 12.0 ug/dL 9.5   Free Thyroxine Index Latest Ref Range: 1.2 - 4.9  2.9   T3 Uptake Ratio Latest Ref Range: 24 - 39 % 30     Assessment & Plan:   1. DM type 2 causing vascular disease (HCC)  - Jeffrey Shaw has currently uncontrolled symptomatic type 2 DM since  70 years of age. Jeffrey Shaw currently uses a freestyle libre device, monitoring adequately.  His blood glucose readings are in target about 90% of the time.  His recent A1c was 6.5% on July 05, 2020. Recent labs reviewed. - I had a long discussion with him about the progressive nature of diabetes and the pathology behind its complications. -his diabetes is complicated by coronary artery disease which required  coronary stent placement and Jeffrey Shaw remains at a high risk for more acute and chronic complications which include CAD, CVA, CKD, retinopathy, and neuropathy. These are  all discussed in detail with him.  - I have counseled him on diet  and weight management  by adopting a carbohydrate restricted/protein rich diet. Patient is encouraged to switch to  unprocessed or minimally processed     complex starch and increased protein intake (animal or plant source), fruits, and vegetables. -  Jeffrey Shaw is advised to stick to a routine mealtimes to eat 3 meals  a day and avoid unnecessary snacks ( to snack only to correct hypoglycemia).   - Jeffrey Shaw admits that there is a room for improvement in his food and drink choices. - Suggestion is made for him to avoid simple carbohydrates  from his diet including Cakes, Sweet Desserts, Ice Cream, Soda (diet and regular), Sweet Tea, Candies, Chips, Cookies, Store Bought Juices, Alcohol in Excess of  1-2 drinks a day, Artificial Sweeteners,  Coffee Creamer, and "Sugar-free" Products. This will help patient to have more stable blood glucose profile and potentially avoid unintended weight gain.  - Jeffrey Shaw will be scheduled with Norm Salt, RDN, CDE for diabetes education.  - I have approached him with the following individualized plan to manage  his diabetes and patient agrees:   - Jeffrey Shaw will continue to require intensive treatment with basal/bolus insulin in order for him to maintain control of diabetes to target.   -She will need work-up to classify his diabetes more specifically with antipancreatic antibodies before his next visit.    -In the meantime, Jeffrey Shaw is advised to continue Levemir 30 units nightly, lower NovoLog to 10 -16 units 3 times a day with meals  for pre-meal BG readings of 90-150mg /dl, plus patient specific correction dose for unexpected hyperglycemia above 150mg /dl, associated with strict monitoring of glucose 4 times a day-before meals and at bedtime. - Jeffrey Shaw is warned not to take insulin without proper monitoring per orders. - Adjustment parameters are given to him for hypo and hyperglycemia in writing. - Jeffrey Shaw is encouraged to call clinic  for blood glucose levels less than 70 or above 300 mg /dl.  - Jeffrey Shaw is advised to continue Ozempic 1 mg subcutaneously weekly, therapeutically suitable for patient .  This  medication will be discontinued if Jeffrey Shaw is found to have type 1 diabetes or LADA. -Have not seen contraindication for not using Metformin in his case.  I discussed and added Metformin 500 mg ER p.o. daily at  breakfast.  - Specific targets for  A1c;  LDL, HDL,  and Triglycerides were discussed with the patient.  2) Blood Pressure /Hypertension:  his blood pressure is  controlled to target.   Jeffrey Shaw is advised to continue his current medications including lisinopril 10  Mg p.o. daily with breakfast . 3) Lipids/Hyperlipidemia:   Review of his recent lipid panel showed  controlled  LDL at 71 .  Jeffrey Shaw  is advised to continue    atorvastatin 40 mg daily at bedtime.  Side effects and precautions discussed with him.  4)  Weight/Diet:  Body mass index is 28.75 kg/m.  -    Jeffrey Shaw is  a candidate for some weight loss. I discussed with him the fact that loss of 5 - 10% of his  current body weight will have the most impact on his diabetes management.  Exercise, and detailed carbohydrates information provided  -  detailed on discharge instructions.  5) hypothyroidism: The circumstances of his diagnosis are not available to me.  Jeffrey Shaw is advised to continue levothyroxine 175 mcg daily before breakfast.  - We discussed about the correct intake of his thyroid hormone, on empty stomach at fasting, with water, separated by at least 30 minutes from breakfast and other medications,  and separated by more than 4 hours from calcium, iron, multivitamins, acid reflux medications (PPIs). -Patient is made aware of the fact that thyroid hormone replacement is needed for life, dose to be adjusted by periodic monitoring of thyroid function tests.   6) Chronic Care/Health Maintenance:  -Jeffrey Shaw  is on ACEI/ARB and Statin medications and  is encouraged to initiate and continue to  follow up with Ophthalmology, Dentist,  Podiatrist at least yearly or according to recommendations, and advised to   stay away from smoking. I have recommended yearly flu vaccine and pneumonia vaccine at least every 5 years; moderate intensity exercise for up to 150 minutes weekly; and  sleep for at least 7 hours a day.  - Jeffrey Shaw is  advised to maintain close follow up with Gwenlyn FudgeJoyce, Britney F, FNP for primary care needs, as well as his other providers for optimal and coordinated care.   - Time spent in this patient care: 60 min, of which > 50% was spent in  counseling  him about his currently controlled type?  Diabetes, hyperlipidemia, hypertension and the rest reviewing his blood glucose logs , discussing his hypoglycemia and hyperglycemia episodes, reviewing his current and  previous labs / studies  ( including abstraction from other facilities) and medications  doses and developing a  long term treatment plan based on the latest standards of care/ guidelines; and documenting his care.    Please refer to Patient Instructions for Blood Glucose Monitoring and Insulin/Medications Dosing Guide"  in media tab for additional information. Please  also refer to " Patient Self Inventory" in the Media  tab for reviewed elements of pertinent patient history.  Jeffrey Shaw participated in the discussions, expressed understanding, and voiced agreement with the above plans.  All questions were answered to his satisfaction. Jeffrey Shaw is encouraged to contact clinic should Jeffrey Shaw have any questions or concerns prior to his return visit.   Follow up plan: - Return in about 9 weeks (around 10/08/2020) for F/U with Pre-visit Labs, Meter, Logs, A1c here.Marquis Lunch.  Gebre Lynden Carrithers, MD [facility] [address] [place], [place] [address] Phone: [phone]  Fax: [phone]    08/06/2020, 6:25 PM  This note was partially dictated  with voice recognition software. Similar sounding words  can be transcribed inadequately or may not  be corrected upon review.

## 2020-08-27 ENCOUNTER — Other Ambulatory Visit: Payer: Self-pay | Admitting: Family Medicine

## 2020-08-27 DIAGNOSIS — E1165 Type 2 diabetes mellitus with hyperglycemia: Secondary | ICD-10-CM

## 2020-10-01 ENCOUNTER — Encounter: Payer: Self-pay | Admitting: Family Medicine

## 2020-10-02 ENCOUNTER — Other Ambulatory Visit: Payer: Medicare Other

## 2020-10-02 ENCOUNTER — Other Ambulatory Visit: Payer: Self-pay

## 2020-10-02 DIAGNOSIS — E1159 Type 2 diabetes mellitus with other circulatory complications: Secondary | ICD-10-CM | POA: Diagnosis not present

## 2020-10-04 LAB — ANTI-ISLET CELL ANTIBODY: Islet Cell Ab: NEGATIVE

## 2020-10-04 LAB — T4, FREE: Free T4: 1.82 ng/dL — ABNORMAL HIGH (ref 0.82–1.77)

## 2020-10-04 LAB — TSH: TSH: 2.73 u[IU]/mL (ref 0.450–4.500)

## 2020-10-04 LAB — GLUTAMIC ACID DECARBOXYLASE AUTO ABS: Glutamic Acid Decarb Ab: 127.9 U/mL — ABNORMAL HIGH (ref 0.0–5.0)

## 2020-10-09 ENCOUNTER — Other Ambulatory Visit: Payer: Self-pay

## 2020-10-09 ENCOUNTER — Encounter: Payer: Self-pay | Admitting: "Endocrinology

## 2020-10-09 ENCOUNTER — Ambulatory Visit (INDEPENDENT_AMBULATORY_CARE_PROVIDER_SITE_OTHER): Payer: Medicare Other | Admitting: "Endocrinology

## 2020-10-09 VITALS — BP 126/68 | HR 80 | Ht 76.0 in | Wt 229.8 lb

## 2020-10-09 DIAGNOSIS — E139 Other specified diabetes mellitus without complications: Secondary | ICD-10-CM | POA: Diagnosis not present

## 2020-10-09 DIAGNOSIS — E039 Hypothyroidism, unspecified: Secondary | ICD-10-CM | POA: Diagnosis not present

## 2020-10-09 LAB — POCT GLYCOSYLATED HEMOGLOBIN (HGB A1C): Hemoglobin A1C: 6.9 % — AB (ref 4.0–5.6)

## 2020-10-09 MED ORDER — LEVOTHYROXINE SODIUM 150 MCG PO TABS: 150.0000 ug | ORAL_TABLET | Freq: Every day | ORAL | 1 refills | Status: DC

## 2020-10-09 NOTE — Patient Instructions (Signed)

## 2020-10-09 NOTE — Progress Notes (Signed)
10/09/2020, 5:16 PM   Endocrinology follow-up note  Subjective:    Patient ID: Jeffrey Shaw, male    DOB: [birth year].  Jeffrey Shaw is being seen in follow-up after he was seen in consultation for management of currently uncontrolled symptomatic diabetes requested by  Gwenlyn Fudge, FNP.   Past Medical History:  Diagnosis Date  . Arthritis   . CAD (coronary artery disease)    CABG 01/2008 /  Catheterization, November, 2012, LIMA to the LAD patent,  70% distal left main stenosis involving the proximal circumflex, DES to the left main continuing into the circumflex  . CORONARY ARTERY BYPASS GRAFT, HX OF 01/17/2010   Qualifier: Diagnosis of  By: Myrtis Ser, MD, Ruthann Cancer Lemmie Evens   . Diabetes mellitus    Dr. Nonie Hoyer, [facility]  . GERD (gastroesophageal reflux disease)   . HTN (hypertension)   . 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 Hyperlipemia   . Hypothyroid   . Primary osteoarthritis of left hip 04/18/2016    Past Surgical History:  Procedure Laterality Date  . BACK SURGERY    . CARPAL TUNNEL RELEASE     left hand  . CORONARY ANGIOPLASTY    . CORONARY ARTERY BYPASS GRAFT  01/2008   X 6  . INGUINAL HERNIA REPAIR     right  . LEFT HEART CATHETERIZATION WITH CORONARY/GRAFT ANGIOGRAM N/A 10/30/2011   Procedure: LEFT HEART CATHETERIZATION WITH Isabel Caprice;  Surgeon: Herby Abraham, MD;  Location: [facility] CATH LAB;  Service: Cardiovascular;  Laterality: N/A;  . LUMBAR DISC SURGERY     removed  . PERCUTANEOUS CORONARY STENT INTERVENTION (PCI-S) N/A 11/03/2011   Procedure: PERCUTANEOUS CORONARY STENT INTERVENTION (PCI-S);  Surgeon: Kathleene Hazel, MD;  Location: [facility] CATH LAB;  Service: Cardiovascular;  Laterality: N/A;  . TONSILLECTOMY  ~ 1955  . TOTAL HIP ARTHROPLASTY Left 04/18/2016   Procedure: TOTAL HIP ARTHROPLASTY;  Surgeon: Frederico Hamman,  MD;  Location: [facility] OR;  Service: Orthopedics;  Laterality: Left;  . URETEROLITHOTOMY Left    with stone basket removal    Social History   Socioeconomic History  . Marital status: Married    Spouse name: SANDRA  . Number of children: 0  . Years of education: Not on file  . Highest education level: 12th grade  Occupational History  . Occupation: SELF EMPLOYED  Tobacco Use  . Smoking status: Former Smoker    Packs/day: 0.80    Years: 6.00    Pack years: 4.80    Types: Cigarettes    Quit date: 12/22/1968    Years since quitting: 51.8  . Smokeless tobacco: Never Used  Vaping Use  . Vaping Use: Never used  Substance and Sexual Activity  . Alcohol use: Yes    Alcohol/week: 2.0 standard drinks    Types: 2 Cans of beer per week  . Drug use: No  . Sexual activity: Yes  Other Topics Concern  . Not on file  Social History Narrative  . Not on file   Social Determinants of Health   Financial Resource Strain:   .  Difficulty of Paying Living Expenses: Not on file  Food Insecurity:   . Worried About Programme researcher, broadcasting/film/video in the Last Year: Not on file  . Ran Out of Food in the Last Year: Not on file  Transportation Needs:   . Lack of Transportation (Medical): Not on file  . Lack of Transportation (Non-Medical): Not on file  Physical Activity:   . Days of Exercise per Week: Not on file  . Minutes of Exercise per Session: Not on file  Stress:   . Feeling of Stress : Not on file  Social Connections:   . Frequency of Communication with Friends and Family: Not on file  . Frequency of Social Gatherings with Friends and Family: Not on file  . Attends Religious Services: Not on file  . Active Member of Clubs or Organizations: Not on file  . Attends Banker Meetings: Not on file  . Marital Status: Not on file    Family History  Problem Relation Age of Onset  . Dementia Mother   . Liver disease Mother   . Suicidality Father   . Emphysema Father   . Cancer Father   .  Drug abuse Brother        Clean now  . Rheum arthritis Sister   . Heart attack Maternal Grandmother   . Emphysema Maternal Grandfather   . Stroke Maternal Grandfather   . Stomach cancer Paternal Grandmother   . Diabetes Paternal Aunt   . Diabetes Paternal Aunt     Outpatient Encounter Medications as of 10/09/2020  Medication Sig  . Ascorbic Acid (VITAMIN C WITH ROSE HIPS) 500 MG tablet Take 500 mg by mouth 2 (two) times daily.  Marland Kitchen aspirin EC 81 MG tablet Take by mouth.  Marland Kitchen atorvastatin (LIPITOR) 40 MG tablet Take 1 tablet (40 mg total) by mouth daily.  . Continuous Blood Gluc Receiver (DEXCOM G6 RECEIVER) DEVI 1 Device by Does not apply route daily.  . Continuous Blood Gluc Sensor (DEXCOM G6 SENSOR) MISC USE ONE SENSOR EVERY 10 DAYS  . Continuous Blood Gluc Transmit (DEXCOM G6 TRANSMITTER) MISC 1 Device by Does not apply route every 3 (three) months.  . famotidine (PEPCID) 20 MG tablet Take 20 mg by mouth 2 (two) times daily.  . insulin aspart (NOVOLOG) 100 UNIT/ML injection Inject 10-16 Units into the skin 3 (three) times daily before meals.  . Insulin Detemir (LEVEMIR FLEXTOUCH) 100 UNIT/ML Pen Inject 30 Units into the skin at bedtime.   Marland Kitchen levothyroxine (SYNTHROID) 150 MCG tablet Take 1 tablet (150 mcg total) by mouth daily before breakfast.  . lisinopril (ZESTRIL) 10 MG tablet Take 1 tablet (10 mg total) by mouth daily.  . metFORMIN (GLUCOPHAGE XR) 500 MG 24 hr tablet Take 1 tablet (500 mg total) by mouth daily with breakfast.  . Multiple Vitamin (MULTIVITAMIN) tablet Take 1 tablet by mouth daily.    Marland Kitchen OVER THE COUNTER MEDICATION Take 1 capsule by mouth 2 (two) times daily. 3-6-9 fish oil, flax seed combo  . pantoprazole (PROTONIX) 40 MG tablet Take 1 tablet (40 mg total) by mouth daily.  . sildenafil (VIAGRA) 100 MG tablet Take 1 tablet (100 mg total) by mouth daily as needed for erectile dysfunction.  Marland Kitchen zinc gluconate 50 MG tablet Take 50 mg by mouth daily.  . [DISCONTINUED]  levothyroxine (SYNTHROID) 175 MCG tablet Take 175 mcg by mouth 6 out of 7 days of the week. On the other day, take 200 mcg.  . [DISCONTINUED] Semaglutide,  1 MG/DOSE, (OZEMPIC, 1 MG/DOSE,) 2 MG/1.5ML SOPN Inject 1 mg into the skin once a week.   No facility-administered encounter medications on file as of 10/09/2020.    ALLERGIES: Allergies  Allergen Reactions  . Morphine And Related Nausea Only    VACCINATION STATUS: Immunization History  Administered Date(s) Administered  . Influenza Split 10/07/2017  . Influenza,inj,quad, With Preservative 09/24/2018, 10/10/2019  . Influenza-Unspecified 10/13/2008, 10/06/2014, 10/05/2015, 09/24/2016, 09/24/2018  . Pneumococcal Conjugate-13 10/06/2014  . Pneumococcal Polysaccharide-23 10/09/1997, 01/07/2019  . Td 09/02/2004  . Tdap 03/14/2013  . Zoster 07/29/2013    Diabetes He presents for his follow-up diabetic visit. 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 Onset time: She was diagnosed at approximate age of 34 years. His disease course has been improving. There are no hypoglycemic associated symptoms. Pertinent negatives for hypoglycemia include no confusion, headaches, pallor or seizures. There are no diabetic associated symptoms. Pertinent negatives for diabetes include no chest pain, no fatigue, no polydipsia, no polyphagia, no polyuria and no weakness. There are no hypoglycemic complications. Symptoms are stable. Diabetic complications include heart disease. Risk factors for coronary artery disease include dyslipidemia, diabetes mellitus, family history, male sex and hypertension. Current diabetic treatments: He is currently on Levemir 30 units nightly, NovoLog 15-20 units 3 times daily AC, Ozempic 1 mg subcutaneously weekly. He is compliant with treatment all of the time. His weight is decreasing steadily. He is following a generally unhealthy diet. When asked about meal planning, he reported none. He has not  had a previous visit with a dietitian. He participates in exercise daily. His home blood glucose trend is decreasing steadily. His breakfast blood glucose range is generally 140-180 mg/dl. His Shaw blood glucose range is generally 140-180 mg/dl. His dinner blood glucose range is generally 140-180 mg/dl. His bedtime blood glucose range is generally 140-180 mg/dl. His overall blood glucose range is 140-180 mg/dl. (He presents with his CGM device showing near target glycemic profile and point-of-care A1c of 6.8%.  He does not have any major hypoglycemia documented or reported.   ) An ACE inhibitor/angiotensin II receptor blocker is being taken. Eye exam is current.  Hyperlipidemia This is a chronic problem. The current episode started more than 1 year ago. The problem is controlled. Exacerbating diseases include diabetes. Pertinent negatives include no chest pain, myalgias or shortness of breath. Risk factors for coronary artery disease include diabetes mellitus, dyslipidemia, male sex, hypertension and family history.  Hypertension This is a chronic problem. The current episode started more than 1 year ago. Pertinent negatives include no chest pain, headaches, neck pain, palpitations or shortness of breath. Risk factors for coronary artery disease include dyslipidemia, diabetes mellitus, male gender and family history. Past treatments include ACE inhibitors. Hypertensive end-organ damage includes CAD/MI.     Review of Systems  Constitutional: Negative for chills, fatigue, fever and unexpected weight change.  HENT: Negative for dental problem, mouth sores and trouble swallowing.   Eyes: Negative for visual disturbance.  Respiratory: Negative for cough, choking, chest tightness, shortness of breath and wheezing.   Cardiovascular: Negative for chest pain, palpitations and leg swelling.  Gastrointestinal: Negative for abdominal distention, abdominal pain, constipation, diarrhea, nausea and vomiting.   Endocrine: Negative for polydipsia, polyphagia and polyuria.  Genitourinary: Negative for dysuria, flank pain, hematuria and urgency.  Musculoskeletal: Negative for back pain, gait problem, myalgias and neck pain.  Skin: Negative for pallor, rash and wound.  Neurological: Negative for seizures, syncope, weakness, numbness and headaches.  Psychiatric/Behavioral: Negative for confusion and dysphoric mood.    Objective:    Vitals with BMI 10/09/2020 08/06/2020 07/05/2020  Height 6\' 4"  6\' 4"  6\' 4"   Weight 229 lbs 13 oz 236 lbs 3 oz 234 lbs  BMI 27.98 28.76 28.5  Systolic 126 136  Diastolic 68 58 68  Pulse 80 64 71    BP 126/68   Pulse 80   Ht 6\' 4"  (1.93 m)   Wt 229 lb 12.8 oz (104.2 kg)   BMI 27.97 kg/m   Wt Readings from Last 3 Encounters:  10/09/20 229 lb 12.8 oz (104.2 kg)  08/06/20 236 lb 3.2 oz (107.1 kg)  07/05/20 234 lb (106.1 kg)     Physical Exam Constitutional:      General: He is not in acute distress.    Appearance: He is well-developed.  HENT:     Head: Normocephalic and atraumatic.  Neck:     Thyroid: No thyromegaly.     Trachea: No tracheal deviation.  Cardiovascular:     Rate and Rhythm: Normal rate.     Pulses:          Dorsalis pedis pulses are 1+ on the right side and 1+ on the left side.       Posterior tibial pulses are 1+ on the right side and 1+ on the left side.     Heart sounds: Normal heart sounds, S1 normal and S2 normal. No murmur heard.  No gallop.   Pulmonary:     Effort: Pulmonary effort is normal. No respiratory distress.     Breath sounds: No wheezing.  Abdominal:     General: There is no distension.     Tenderness: There is no abdominal tenderness. There is no guarding.  Musculoskeletal:     Right shoulder: No swelling or deformity.     Cervical back: Normal range of motion and neck supple.  Skin:    General: Skin is warm and dry.     Findings: No rash.     Nails: There is no clubbing.  Neurological:     Mental Status: He  is alert and oriented to person, place, and time.     Cranial Nerves: No cranial nerve deficit.     Sensory: No sensory deficit.     Gait: Gait normal.     Deep Tendon Reflexes: Reflexes are normal and symmetric.  Psychiatric:        Speech: Speech normal.        Behavior: Behavior normal. Behavior is cooperative.        Thought Content: Thought content normal.        Judgment: Judgment normal.       CMP ( most recent) CMP     Component Value Date/Time   NA 135 07/05/2020 1336   K 4.2 07/05/2020 1336   CL 100 07/05/2020 1336   CO2 22 07/05/2020 1336   GLUCOSE 131 (H) 07/05/2020 1336   GLUCOSE 141 (H) 01/17/2020 1142   BUN 13 07/05/2020 1336   CREATININE 1.18 07/05/2020 1336   CALCIUM 9.5 07/05/2020 1336   PROT 6.7 07/05/2020 1336   ALBUMIN 4.4 07/05/2020 1336   AST 27 07/05/2020 1336   ALT 27 07/05/2020 1336   ALKPHOS 69 07/05/2020 1336   BILITOT 1.5 (H) 07/05/2020 1336   GFRNONAA 62 07/05/2020 1336   GFRAA 72 07/05/2020 1336     Diabetic Labs (most recent): Lab Results  Component Value Date   HGBA1C 6.9 (A) 10/09/2020  HGBA1C 6.5 07/05/2020   HGBA1C 7.1 (H) 04/05/2020     Lipid Panel ( most recent) Lipid Panel     Component Value Date/Time   CHOL 134 04/05/2020 1133   TRIG 52 04/05/2020 1133   HDL 51 04/05/2020 1133   CHOLHDL 2.6 04/05/2020 1133   CHOLHDL 2.5 10/30/2011 0520   VLDL 10 10/30/2011 0520   LDLCALC 71 04/05/2020 1133   LABVLDL 12 04/05/2020 1133       Assessment & Plan:   1. LADA-being managed as type 1, complicated by vascular disease.    - Jeffrey Shaw has currently controlled symptomatic LADA  since  69 years of age. He currently uses Dexcom CGM.    His blood glucose readings are in target about 90% of the time.  His point-of-care A1c is 6.8%, and remains stable.   Recent labs reviewed. - I had a long discussion with him about classifications of diabetes and the progressive nature of diabetes and the pathology behind its  complications. -his diabetes is complicated by coronary artery disease which required  coronary stent placement and he remains at a high risk for more acute and chronic complications which include CAD, CVA, CKD, retinopathy, and neuropathy. These are all discussed in detail with him.  - I have counseled him on diet  and weight management  by adopting a carbohydrate restricted/protein rich diet. Patient is encouraged to switch to  unprocessed or minimally processed     complex starch and increased protein intake (animal or plant source), fruits, and vegetables. -  he is advised to stick to a routine mealtimes to eat 3 meals  a day and avoid unnecessary snacks ( to snack only to correct hypoglycemia).   - he  admits there is a room for improvement in his diet and drink choices. -  Suggestion is made for him to avoid simple carbohydrates  from his diet including Cakes, Sweet Desserts / Pastries, Ice Cream, Soda (diet and regular), Sweet Tea, Candies, Chips, Cookies, Sweet Pastries,  Store Bought Juices, Alcohol in Excess of  1-2 drinks a day, Artificial Sweeteners, Coffee Creamer, and "Sugar-free" Products. This will help patient to have stable blood glucose profile and potentially avoid unintended weight gain.  - he will be scheduled with Norm Salt, RDN, CDE for diabetes education.  - I have approached him with the following individualized plan to manage  his diabetes and patient agrees:   - he will continue to require intensive treatment with basal/bolus insulin in order for him to maintain control of diabetes to target.   -In light of his positive antiglutamic acid decarboxylase enzymes, his diabetes is being reclassified as LADA to be managed as type I.    - He is advised to continue Levemir 30 units nightly, NovoLog 10 units 3 times a day with meals  for pre-meal BG readings of 90-150mg /dl, plus patient specific correction dose for unexpected hyperglycemia above 150mg /dl, associated with  strict monitoring of glucose 4 times a day-before meals and at bedtime. - he is warned not to take insulin without proper monitoring per orders. - Adjustment parameters are given to him for hypo and hyperglycemia in writing. - he is encouraged to call clinic for blood glucose levels less than 70 or above 300 mg /dl.  - he is advised to discontinue Ozempic at this time.  -He will continue to benefit from low-dose Metformin.  He is advised to continue Metformin 500 mg ER p.o. daily after breakfast. - Specific targets for  A1c;  LDL, HDL,  and Triglycerides were discussed with the patient.  2) Blood Pressure /Hypertension:  his blood pressure is  controlled to target.   he is advised to continue his current medications including lisinopril 10  Mg p.o. daily with breakfast . 3) Lipids/Hyperlipidemia:   Review of his recent lipid panel showed  controlled  LDL at 71 .  he  is advised to continue atorvastatin 40 mg p.o. daily at bedtime. Side effects and precautions discussed with him.  4)  Weight/Diet:  Body mass index is 27.97 kg/m.  -    he is not a candidate for major weight loss.  I discussed with him the fact that loss of 5 - 10% of his  current body weight will have the most impact on his diabetes management.  Exercise, and detailed carbohydrates information provided  -  detailed on discharge instructions.  5) hypothyroidism: The circumstances of his diagnosis are not available to me.  His previsit thyroid function tests are consistent with over replacement.  I discussed and lowered his levothyroxine to 150 mcg p.o. daily before breakfast.    - We discussed about the correct intake of his thyroid hormone, on empty stomach at fasting, with water, separated by at least 30 minutes from breakfast and other medications,  and separated by more than 4 hours from calcium, iron, multivitamins, acid reflux medications (PPIs). -Patient is made aware of the fact that thyroid hormone replacement is needed for  life, dose to be adjusted by periodic monitoring of thyroid function tests.   6) Chronic Care/Health Maintenance:  -he  is on ACEI/ARB and Statin medications and  is encouraged to initiate and continue to follow up with Ophthalmology, Dentist,  Podiatrist at least yearly or according to recommendations, and advised to   stay away from smoking. I have recommended yearly flu vaccine and pneumonia vaccine at least every 5 years; moderate intensity exercise for up to 150 minutes weekly; and  sleep for at least 7 hours a day.  - he is  advised to maintain close follow up with JoycGwenlyn Fudge FNP for primary care needs, as well as his other providers for optimal and coordinated care.   - Time spent on this patient care encounter:  45 min, of which > 50% was spent in  counseling and the rest reviewing his blood glucose logs , discussing his hypoglycemia and hyperglycemia episodes, reviewing his current and  previous labs / studies  ( including abstraction from other facilities) and medications  doses and developing a  long term treatment plan and documenting his care.   Please refer to Patient Instructions for Blood Glucose Monitoring and Insulin/Medications Dosing Guide"  in media tab for additional information. Please  also refer to " Patient Self Inventory" in the Media  tab for reviewed elements of pertinent patient history.  Jeffrey Shaw participated in the discussions, expressed understanding, and voiced agreement with the above plans.  All questions were answered to his satisfaction. he is encouraged to contact clinic should he have any questions or concerns prior to his return visit.   Follow up plan: - Return in about 4 months (around 02/09/2021) for F/U with Pre-visit Labs, Meter, Logs, A1c here., ABI in Office NV, Urine MA - NV.  Jeffrey Lunch, MD [facility] [address] [place], [place] [address] Phone: [phone]  Fax: [phone]     10/09/2020, 5:16 PM  This note was partially dictated with voice recognition  software. Similar sounding words can be transcribed inadequately or may not  be corrected upon review.

## 2020-10-11 ENCOUNTER — Other Ambulatory Visit: Payer: Self-pay | Admitting: *Deleted

## 2020-10-11 ENCOUNTER — Encounter: Payer: Self-pay | Admitting: Family Medicine

## 2020-10-11 MED ORDER — ATORVASTATIN CALCIUM 40 MG PO TABS: 40.0000 mg | ORAL_TABLET | Freq: Every day | ORAL | 1 refills | Status: DC

## 2020-10-11 MED ORDER — LISINOPRIL 10 MG PO TABS
10.0000 mg | ORAL_TABLET | Freq: Every day | ORAL | 1 refills | Status: DC
Start: 2020-10-11 — End: 2021-03-26

## 2020-11-13 ENCOUNTER — Ambulatory Visit: Payer: Medicare Other | Admitting: Nutrition

## 2020-11-30 DIAGNOSIS — M7062 Trochanteric bursitis, left hip: Secondary | ICD-10-CM | POA: Diagnosis not present

## 2020-11-30 DIAGNOSIS — M9903 Segmental and somatic dysfunction of lumbar region: Secondary | ICD-10-CM | POA: Diagnosis not present

## 2020-11-30 DIAGNOSIS — M47816 Spondylosis without myelopathy or radiculopathy, lumbar region: Secondary | ICD-10-CM | POA: Diagnosis not present

## 2020-12-03 DIAGNOSIS — M47816 Spondylosis without myelopathy or radiculopathy, lumbar region: Secondary | ICD-10-CM | POA: Diagnosis not present

## 2020-12-03 DIAGNOSIS — M7062 Trochanteric bursitis, left hip: Secondary | ICD-10-CM | POA: Diagnosis not present

## 2020-12-03 DIAGNOSIS — M9903 Segmental and somatic dysfunction of lumbar region: Secondary | ICD-10-CM | POA: Diagnosis not present

## 2020-12-06 DIAGNOSIS — M47816 Spondylosis without myelopathy or radiculopathy, lumbar region: Secondary | ICD-10-CM | POA: Diagnosis not present

## 2020-12-06 DIAGNOSIS — M7062 Trochanteric bursitis, left hip: Secondary | ICD-10-CM | POA: Diagnosis not present

## 2020-12-06 DIAGNOSIS — M9903 Segmental and somatic dysfunction of lumbar region: Secondary | ICD-10-CM | POA: Diagnosis not present

## 2020-12-18 ENCOUNTER — Encounter: Payer: Self-pay | Admitting: Family

## 2020-12-18 ENCOUNTER — Telehealth (INDEPENDENT_AMBULATORY_CARE_PROVIDER_SITE_OTHER): Payer: Medicare Other | Admitting: Family

## 2020-12-18 DIAGNOSIS — U071 COVID-19: Secondary | ICD-10-CM | POA: Diagnosis not present

## 2020-12-18 MED ORDER — DEXAMETHASONE 6 MG PO TABS: 6.0000 mg | ORAL_TABLET | Freq: Two times a day (BID) | ORAL | 0 refills | Status: DC

## 2020-12-18 MED ORDER — BENZONATATE 200 MG PO CAPS: 200.0000 mg | ORAL_CAPSULE | Freq: Three times a day (TID) | ORAL | 1 refills | Status: DC | PRN

## 2020-12-18 MED ORDER — ALBUTEROL SULFATE HFA 108 (90 BASE) MCG/ACT IN AERS: 2.0000 | INHALATION_SPRAY | Freq: Four times a day (QID) | RESPIRATORY_TRACT | 2 refills | Status: DC | PRN

## 2020-12-18 NOTE — Progress Notes (Signed)
Virtual Visit via telephone Note Due to COVID-19 pandemic this visit was conducted virtually. This visit type was conducted due to national recommendations for restrictions regarding the COVID-19 Pandemic (e.g. social distancing, sheltering in place) in an effort to limit this patient's exposure and mitigate transmission in our community. All issues noted in this document were discussed and addressed.  A physical exam was not performed with this format.  I connected with Jeffrey Shaw on 12/18/20 at 1:15 pm  by telephone and verified that I am speaking with the correct person using two identifiers. Jeffrey Shaw is currently located at home and wife is currently with him during visit. The provider, Jannifer Rodney, FNP is located in their office at time of visit.  I discussed the limitations, risks, security and privacy concerns of performing an evaluation and management service by telephone and the availability of in person appointments. I also discussed with the patient that there may be a patient responsible charge related to this service. The patient expressed understanding and agreed to proceed.   History and Present Illness:  Pt calls the office today with positive with COVID. He reports he took a COVID test at home that was positive yesterday. His symptoms started two days ago. He is unvaccinated.  Cough This is a new problem. The current episode started in the past 7 days. The problem has been gradually worsening. The cough is non-productive. Associated symptoms include chills, a fever (99), headaches, myalgias, nasal congestion and postnasal drip. Pertinent negatives include no ear congestion, ear pain, shortness of breath or wheezing. He has tried rest and OTC cough suppressant for the symptoms. The treatment provided mild relief.      Review of Systems  Constitutional: Positive for chills and fever (99).  HENT: Positive for postnasal drip. Negative for ear pain.   Respiratory:  Positive for cough. Negative for shortness of breath and wheezing.   Musculoskeletal: Positive for myalgias.  Neurological: Positive for headaches.     Observations/Objective: No SOB or distress note, hoarse voice   Assessment and Plan: 1. COVID-19 virus detected -Referral to [facility] -Force fluids -Rest -Quarantine discussed Tylenol as needed -Call if symptoms worsen or do not improve  - dexamethasone (DECADRON) 6 MG tablet; Take 1 tablet (6 mg total) by mouth 2 (two) times daily.  Dispense: 14 tablet; Refill: 0 - albuterol (VENTOLIN HFA) 108 (90 Base) MCG/ACT inhaler; Inhale 2 puffs into the lungs every 6 (six) hours as needed for wheezing or shortness of breath.  Dispense: 8 g; Refill: 2 - benzonatate (TESSALON) 200 MG capsule; Take 1 capsule (200 mg total) by mouth 3 (three) times daily as needed.  Dispense: 30 capsule; Refill: 1      I discussed the assessment and treatment plan with the patient. The patient was provided an opportunity to ask questions and all were answered. The patient agreed with the plan and demonstrated an understanding of the instructions.   The patient was advised to call back or seek an in-person evaluation if the symptoms worsen or if the condition fails to improve as anticipated.  The above assessment and management plan was discussed with the patient. The patient verbalized understanding of and has agreed to the management plan. Patient is aware to call the clinic if symptoms persist or worsen. Patient is aware when to return to the clinic for a follow-up visit. Patient educated on when it is appropriate to go to the emergency department.   Time call ended:  1:26 pm  I provided 11 minutes of non-face-to-face time during this encounter.    Evelina Dun, FNP

## 2020-12-19 ENCOUNTER — Ambulatory Visit (HOSPITAL_COMMUNITY)
Admission: RE | Admit: 2020-12-19 | Discharge: 2020-12-19 | Disposition: A | Discharge status: Home / Self Care | Payer: Medicare Other | Source: Ambulatory Visit | Attending: Pulmonary Disease | Admitting: Pulmonary Disease

## 2020-12-19 ENCOUNTER — Other Ambulatory Visit: Payer: Self-pay | Admitting: Nurse Practitioner

## 2020-12-19 DIAGNOSIS — U071 COVID-19: Secondary | ICD-10-CM | POA: Diagnosis present

## 2020-12-19 DIAGNOSIS — Z23 Encounter for immunization: Secondary | ICD-10-CM | POA: Diagnosis not present

## 2020-12-19 MED ORDER — SODIUM CHLORIDE 0.9 % IV SOLN: INTRAVENOUS | Status: DC | PRN

## 2020-12-19 MED ORDER — EPINEPHRINE 0.3 MG/0.3ML IJ SOAJ: 0.3000 mg | Freq: Once | INTRAMUSCULAR | Status: DC | PRN

## 2020-12-19 MED ORDER — ALBUTEROL SULFATE HFA 108 (90 BASE) MCG/ACT IN AERS: 2.0000 | INHALATION_SPRAY | Freq: Once | RESPIRATORY_TRACT | Status: DC | PRN

## 2020-12-19 MED ORDER — SODIUM CHLORIDE 0.9 % IV SOLN: Freq: Once | INTRAVENOUS | Status: AC

## 2020-12-19 MED ORDER — DIPHENHYDRAMINE HCL 50 MG/ML IJ SOLN: 50.0000 mg | Freq: Once | INTRAMUSCULAR | Status: DC | PRN

## 2020-12-19 MED ORDER — FAMOTIDINE IN NACL 20-0.9 MG/50ML-% IV SOLN: 20.0000 mg | Freq: Once | INTRAVENOUS | Status: DC | PRN

## 2020-12-19 MED ORDER — METHYLPREDNISOLONE SODIUM SUCC 125 MG IJ SOLR: 125.0000 mg | Freq: Once | INTRAMUSCULAR | Status: DC | PRN

## 2020-12-19 NOTE — Discharge Instructions (Signed)
10 Things You Can Do to Manage Your COVID-19 Symptoms at Home If you have possible or confirmed COVID-19: 1. Stay home from work and school. And stay away from other public places. If you must go out, avoid using any kind of public transportation, ridesharing, or taxis. 2. Monitor your symptoms carefully. If your symptoms get worse, call your healthcare provider immediately. 3. Get rest and stay hydrated. 4. If you have a medical appointment, call the healthcare provider ahead of time and tell them that you have or may have COVID-19. 5. For medical emergencies, call 911 and notify the dispatch personnel that you have or may have COVID-19. 6. Cover your cough and sneezes with a tissue or use the inside of your elbow. 7. Wash your hands often with soap and water for at least 20 seconds or clean your hands with an alcohol-based hand sanitizer that contains at least 60% alcohol. 8. As much as possible, stay in a specific room and away from other people in your home. Also, you should use a separate bathroom, if available. If you need to be around other people in or outside of the home, wear a mask. 9. Avoid sharing personal items with other people in your household, like dishes, towels, and bedding. 10. Clean all surfaces that are touched often, like counters, tabletops, and doorknobs. Use household cleaning sprays or wipes according to the label instructions. cdc.gov/coronavirus 06/22/2019 This information is not intended to replace advice given to you by your health care provider. Make sure you discuss any questions you have with your health care provider. Document Revised: 11/24/2019 Document Reviewed: 11/24/2019 Elsevier Patient Education  2020 Else[employer] Inc[employer] What types of side effects do monoclonal antibody drugs cause?  Common side effects  In general, the more common side effects caused by monoclonal antibody drugs include: . Allergic reactions, such as hives or itching . Flu-like signs and  symptoms, including chills, fatigue, fever, and muscle aches and pains . Nausea, vomiting . Diarrhea . Skin rashes . Low blood pressure   The CDC is recommending patients who receive monoclonal antibody treatments wait at least 90 days before being vaccinated.  Currently, there are no data on the safety and efficacy of mRNA COVID-19 vaccines in persons who received monoclonal antibodies or convalescent plasma as part of COVID-19 treatment. Based on the estimated half-life of such therapies as well as evidence suggesting that reinfection is uncommon in the 90 days after initial infection, vaccination should be deferred for at least 90 days, as a precautionary measure until additional information becomes available, to avoid interference of the antibody treatment with vaccine-induced immune responses. If you have any questions or concerns after the infusion please call the Advanced Practice Provider on call at [phone]. This number is ONLY intended for your use regarding questions or concerns about the infusion post-treatment side-effects.  Please do not provide this number to others for use. For return to work notes please contact your primary care provider.   If someone you know is interested in receiving treatment please have them call the COVID hotline at [phone].   

## 2020-12-19 NOTE — Progress Notes (Signed)
  Diagnosis: COVID-19  Physician:dr wright  Procedure: [facility] Med: casirivimab\imdevimab infusion - Provided patient with casirivimab\imdevimab fact sheet for patients, parents and caregivers prior to infusion.  Complications: No immediate complications noted.  Discharge: Discharged home   Jeffrey Shaw S Othniel Maret 12/19/2020  

## 2020-12-19 NOTE — Progress Notes (Signed)
Patient reviewed Fact Sheet for Patients, Parents, and Caregivers for Emergency Use Authorization (EUA) of Casi/Regen for the Treatment of Coronavirus. Patient also reviewed and is agreeable to the estimated cost of treatment. Patient is agreeable to proceed.    

## 2020-12-19 NOTE — Progress Notes (Signed)
I connected by phone with patient to discuss the potential use of a new treatment for mild to moderate COVID-19 viral infection in non-hospitalized patients.   This patient is a that meets the FDA criteria for Emergency Use Authorization of COVID monoclonal antibody  1. Has a (+) direct SARS-CoV-2 viral test result 2. Has mild or moderate COVID-19  3. Is NOT hospitalized due to COVID-19 4. Is within 7 days of symptom onset 5. Has at least one of the high risk factor(s) for progression to severe COVID-19 and/or hospitalization as defined in EUA. ? Specific high risk criteria : Diabetes, hypertension, obesity     I have spoken and communicated the following to the patient or parent/caregiver regarding COVID monoclonal antibody treatment:   1. FDA has authorized the emergency use for the treatment of high risk post-exposure prophylaxis for COVID19.    2. The significant known and potential risks and benefits of COVID monoclonal antibody, and the extent to which such potential risks and benefits are unknown.    3. Patients treated with COVID monoclonal antibody should continue to      self-isolate and use infection control measures (e.g., wear mask, isolate, social distance, avoid sharing personal items, clean and disinfect "high touch" surfaces, and frequent handwashing) according to CDC guidelines.      After reviewing this information with the patient, The patient agreed to proceed with receiving monoclonal antibody infusion and will be provided a copy of the Fact sheet prior to receiving the infusion. Sacha Topor, NP    

## 2020-12-21 ENCOUNTER — Encounter (INDEPENDENT_AMBULATORY_CARE_PROVIDER_SITE_OTHER): Payer: Self-pay

## 2021-01-04 ENCOUNTER — Telehealth: Payer: Self-pay | Admitting: Family Medicine

## 2021-01-04 ENCOUNTER — Other Ambulatory Visit: Payer: Self-pay | Admitting: Nurse Practitioner

## 2021-01-04 DIAGNOSIS — E1165 Type 2 diabetes mellitus with hyperglycemia: Secondary | ICD-10-CM

## 2021-01-04 MED ORDER — DEXCOM G6 RECEIVER DEVI: 1.0000 | Freq: Every day | 0 refills | Status: DC

## 2021-01-04 NOTE — Telephone Encounter (Signed)
Pt washed his glucose receiver and wants to know if another can be called in for him - also if he will have to pay out of pocket

## 2021-01-08 ENCOUNTER — Encounter: Payer: Self-pay | Admitting: Family Medicine

## 2021-01-08 ENCOUNTER — Ambulatory Visit (INDEPENDENT_AMBULATORY_CARE_PROVIDER_SITE_OTHER): Payer: Medicare Other | Admitting: Family Medicine

## 2021-01-08 DIAGNOSIS — I10 Essential (primary) hypertension: Secondary | ICD-10-CM

## 2021-01-08 DIAGNOSIS — K219 Gastro-esophageal reflux disease without esophagitis: Secondary | ICD-10-CM | POA: Diagnosis not present

## 2021-01-08 DIAGNOSIS — E1165 Type 2 diabetes mellitus with hyperglycemia: Secondary | ICD-10-CM | POA: Diagnosis not present

## 2021-01-08 DIAGNOSIS — E782 Mixed hyperlipidemia: Secondary | ICD-10-CM

## 2021-01-08 DIAGNOSIS — E039 Hypothyroidism, unspecified: Secondary | ICD-10-CM | POA: Diagnosis not present

## 2021-01-08 DIAGNOSIS — I2510 Atherosclerotic heart disease of native coronary artery without angina pectoris: Secondary | ICD-10-CM

## 2021-01-08 DIAGNOSIS — Z794 Long term (current) use of insulin: Secondary | ICD-10-CM

## 2021-01-08 MED ORDER — DEXCOM G6 SENSOR MISC: 5 refills | Status: DC

## 2021-01-08 MED ORDER — PANTOPRAZOLE SODIUM 40 MG PO TBEC: 40.0000 mg | DELAYED_RELEASE_TABLET | Freq: Every day | ORAL | 5 refills | Status: DC | PRN

## 2021-01-08 MED ORDER — METFORMIN HCL ER 500 MG PO TB24: 500.0000 mg | ORAL_TABLET | Freq: Every day | ORAL | 1 refills | Status: DC

## 2021-01-08 NOTE — Progress Notes (Signed)
Virtual Visit via Telephone Note  I connected with Jeffrey Shaw on 01/08/21 at 1:20 PM by telephone and verified that I am speaking with the correct person using two identifiers. Jeffrey Shaw is currently located at home and his wife is currently with him during this visit. The provider, Loman Brooklyn, FNP is located in their home at time of visit.  I discussed the limitations, risks, security and privacy concerns of performing an evaluation and management service by telephone and the availability of in person appointments. I also discussed with the patient that there may be a patient responsible charge related to this service. The patient expressed understanding and agreed to proceed.  Subjective: PCP: Loman Brooklyn, FNP  Chief Complaint  Patient presents with  . Medical Management of Chronic Issues   Diabetes: Patient presents for follow up of diabetes. Current symptoms include: hyperglycemia. Known diabetic complications: cardiovascular disease. Current diet: in general, a "healthy" diet  . Current exercise: aerobics, bicycling, cardiovascular workout on exercise equipment, swimming, walking and weightlifting. Home blood sugar records: patient has a Dexcom system and is still having high blood glucose readings during the night. Is he  on ACE inhibitor or angiotensin II receptor blocker? Yes. Is he on a statin? Yes.  Patient saw Dr. 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  Patient states he was told to never take more than 16 units of NovoLog, no matter how high his blood glucose was.  He states he tried doing this but his blood sugars were staying way too high so he went back to how he was previously using his sliding scale.  He continues with Levemir 30 units nightly.  He states Dr. Dorris Fetch told him to stop using Ozempic since he was not a type II diabetic, but he has continued it.  He is also taking metformin 500 mg once daily.  He does not wish to return to the  endocrinologist.  Lab Results  Component Value Date   HGBA1C 6.9 (A) 10/09/2020   HGBA1C 6.5 07/05/2020   HGBA1C 7.1 (H) 04/05/2020   Lab Results  Component Value Date   LDLCALC 71 04/05/2020   CREATININE 1.18 07/05/2020     COVID: Patient had COVID last month, but reports he is doing much better now.  Hypothyroidism: Patient was instructed by endocrinologist to decrease his levothyroxine to 150 mcg once daily when his free T4 resulted slightly elevated at 1.82, which she has been doing.  GERD: Patient takes Protonix as needed, which works well for him.  Hyperlipidemia/CAD: Patient's last lipid panel was less than 1 year ago and was normal at that time.  He does take atorvastatin and a fish oil/flaxseed combination pill daily.   ROS: Per HPI  Current Outpatient Medications:  .  albuterol (VENTOLIN HFA) 108 (90 Base) MCG/ACT inhaler, Inhale 2 puffs into the lungs every 6 (six) hours as needed for wheezing or shortness of breath., Disp: 8 g, Rfl: 2 .  Ascorbic Acid (VITAMIN C WITH ROSE HIPS) 500 MG tablet, Take 500 mg by mouth 2 (two) times daily., Disp: , Rfl:  .  aspirin EC 81 MG tablet, Take by mouth., Disp: , Rfl:  .  atorvastatin (LIPITOR) 40 MG tablet, Take 1 tablet (40 mg total) by mouth daily., Disp: 90 tablet, Rfl: 1 .  benzonatate (TESSALON) 200 MG capsule, Take 1 capsule (200 mg total) by mouth 3 (three) times daily as needed., Disp: 30 capsule, Rfl: 1 .  Continuous Blood Gluc  Receiver (DEXCOM G6 RECEIVER) DEVI, 1 Device by Does not apply route daily., Disp: 1 each, Rfl: 0 .  Continuous Blood Gluc Sensor (DEXCOM G6 SENSOR) MISC, USE ONE SENSOR EVERY 10 DAYS, Disp: 3 each, Rfl: 5 .  Continuous Blood Gluc Transmit (DEXCOM G6 TRANSMITTER) MISC, 1 Device by Does not apply route every 3 (three) months., Disp: 1 each, Rfl: 3 .  famotidine (PEPCID) 20 MG tablet, Take 20 mg by mouth 2 (two) times daily., Disp: , Rfl:  .  insulin aspart (NOVOLOG) 100 UNIT/ML injection, Inject 10-16  Units into the skin 3 (three) times daily before meals., Disp: , Rfl:  .  Insulin Detemir (LEVEMIR FLEXTOUCH) 100 UNIT/ML Pen, Inject 30 Units into the skin at bedtime. , Disp: , Rfl:  .  levothyroxine (SYNTHROID) 150 MCG tablet, Take 1 tablet (150 mcg total) by mouth daily before breakfast., Disp: 90 tablet, Rfl: 1 .  lisinopril (ZESTRIL) 10 MG tablet, Take 1 tablet (10 mg total) by mouth daily., Disp: 90 tablet, Rfl: 1 .  metFORMIN (GLUCOPHAGE XR) 500 MG 24 hr tablet, Take 1 tablet (500 mg total) by mouth daily with breakfast., Disp: 90 tablet, Rfl: 1 .  Multiple Vitamin (MULTIVITAMIN) tablet, Take 1 tablet by mouth daily.  , Disp: , Rfl:  .  OVER THE COUNTER MEDICATION, Take 1 capsule by mouth 2 (two) times daily. 3-6-9 fish oil, flax seed combo, Disp: , Rfl:  .  pantoprazole (PROTONIX) 40 MG tablet, Take 1 tablet (40 mg total) by mouth daily., Disp: 30 tablet, Rfl: 0 .  sildenafil (VIAGRA) 100 MG tablet, Take 1 tablet (100 mg total) by mouth daily as needed for erectile dysfunction., Disp: 30 tablet, Rfl: 2 .  zinc gluconate 50 MG tablet, Take 50 mg by mouth daily., Disp: , Rfl:   Allergies  Allergen Reactions  . Morphine And Related Nausea Only   Past Medical History:  Diagnosis Date  . Arthritis   . CAD (coronary artery disease)    CABG 01/2008 /  Catheterization, November, 2012, LIMA to the LAD patent,  70% distal left main stenosis involving the proximal circumflex, DES to the left main continuing into the circumflex  . CORONARY ARTERY BYPASS GRAFT, HX OF 01/17/2010   Qualifier: Diagnosis of  By: Myrtis Ser, MD, Ruthann Cancer Lemmie Evens   . Diabetes mellitus    Dr. Nonie Hoyer, [facility]  . GERD (gastroesophageal reflux disease)   . HTN (hypertension)   . 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 Hyperlipemia   . Hypothyroid   . Primary osteoarthritis of left hip 04/18/2016    Observations/Objective: A&O  No respiratory distress or wheezing audible over  the phone Mood, judgement, and thought processes all WNL   Assessment and Plan: 1. Controlled type 2 diabetes mellitus with hyperglycemia, with long-term current use of insulin (HCC) - Patient will come for A1c when office is open as we are currently close due to inclement weather. - Medications: Continue current medications until labs result. - Home glucose monitoring: Patient has the Dexcom system which he uses continuously - Patient is currently taking a statin. Patient is taking an ACE-inhibitor/ARB.  - Last foot exam: 07/05/2020 - Last diabetic eye exam: Last year, patient is Shaw to schedule eye exam for this year - Urine Microalbumin/Creat Ratio: 07/06/2020 - metFORMIN (GLUCOPHAGE XR) 500 MG 24 hr tablet; Take 1 tablet (500 mg total) by mouth daily with breakfast.  Dispense:  90 tablet; Refill: 1 - CMP14+EGFR; Future - Bayer DCA Hb A1c Waived; Future - Continuous Blood Gluc Sensor (DEXCOM G6 SENSOR) MISC; USE ONE SENSOR EVERY 10 DAYS  Dispense: 3 each; Refill: 5  2. Essential hypertension - Well controlled on current regimen.  - CMP14+EGFR; Future  3. Hypothyroidism, unspecified type - Labs to assess for control due to recent dosage change by endocrinologist. - Thyroid Panel With TSH; Future  4. Gastroesophageal reflux disease, unspecified whether esophagitis present - Well controlled on current regimen.  - pantoprazole (PROTONIX) 40 MG tablet; Take 1 tablet (40 mg total) by mouth daily as needed.  Dispense: 30 tablet; Refill: 5  5. Coronary artery disease involving native coronary artery of native heart without angina pectoris - Patient is taking statin medication.  Lipid panel due in 3 months.  6. Mixed hyperlipidemia - Patient is taking statin medication.  Lipid panel due in 3 months.   Follow Up Instructions: Return as directed after labs result.  I discussed the assessment and treatment plan with the patient. The patient was provided an opportunity to ask questions  and all were answered. The patient agreed with the plan and demonstrated an understanding of the instructions.   The patient was advised to call back or seek an in-person evaluation if the symptoms worsen or if the condition fails to improve as anticipated.  The above assessment and management plan was discussed with the patient. The patient verbalized understanding of and has agreed to the management plan. Patient is aware to call the clinic if symptoms persist or worsen. Patient is aware when to return to the clinic for a follow-up visit. Patient educated on when it is appropriate to go to the emergency department.   Time call ended: 1:45 PM  I provided 27 minutes of non-face-to-face time during this encounter.  Hendricks Limes, MSN, APRN, FNP-C [facility] 01/08/21

## 2021-01-10 ENCOUNTER — Other Ambulatory Visit: Payer: Self-pay

## 2021-01-10 ENCOUNTER — Other Ambulatory Visit: Payer: Medicare Other

## 2021-01-10 DIAGNOSIS — Z794 Long term (current) use of insulin: Secondary | ICD-10-CM | POA: Diagnosis not present

## 2021-01-10 DIAGNOSIS — I10 Essential (primary) hypertension: Secondary | ICD-10-CM | POA: Diagnosis not present

## 2021-01-10 DIAGNOSIS — E1165 Type 2 diabetes mellitus with hyperglycemia: Secondary | ICD-10-CM

## 2021-01-10 DIAGNOSIS — E039 Hypothyroidism, unspecified: Secondary | ICD-10-CM

## 2021-01-10 LAB — BAYER DCA HB A1C WAIVED: HB A1C (BAYER DCA - WAIVED): 7 % — ABNORMAL HIGH (ref <7.0)

## 2021-01-11 ENCOUNTER — Other Ambulatory Visit: Payer: Self-pay | Admitting: Family Medicine

## 2021-01-11 DIAGNOSIS — E039 Hypothyroidism, unspecified: Secondary | ICD-10-CM

## 2021-01-11 LAB — CMP14+EGFR
ALT: 24 IU/L (ref 0–44)
AST: 32 IU/L (ref 0–40)
Albumin/Globulin Ratio: 1.7 (ref 1.2–2.2)
Albumin: 4.3 g/dL (ref 3.8–4.8)
Alkaline Phosphatase: 84 IU/L (ref 44–121)
BUN/Creatinine Ratio: 14 (ref 10–24)
BUN: 14 mg/dL (ref 8–27)
Bilirubin Total: 1.5 mg/dL — ABNORMAL HIGH (ref 0.0–1.2)
CO2: 24 mmol/L (ref 20–29)
Calcium: 9.2 mg/dL (ref 8.6–10.2)
Chloride: 101 mmol/L (ref 96–106)
Creatinine, Ser: 1.03 mg/dL (ref 0.76–1.27)
GFR calc Af Amer: 85 mL/min/1.73
GFR calc non Af Amer: 73 mL/min/1.73
Globulin, Total: 2.6 g/dL (ref 1.5–4.5)
Glucose: 127 mg/dL — ABNORMAL HIGH (ref 65–99)
Potassium: 4.5 mmol/L (ref 3.5–5.2)
Sodium: 134 mmol/L (ref 134–144)
Total Protein: 6.9 g/dL (ref 6.0–8.5)

## 2021-01-11 LAB — THYROID PANEL WITH TSH
Free Thyroxine Index: 2.8 (ref 1.2–4.9)
T3 Uptake Ratio: 32 % (ref 24–39)
T4, Total: 8.8 ug/dL (ref 4.5–12.0)
TSH: 14.4 u[IU]/mL — ABNORMAL HIGH (ref 0.450–4.500)

## 2021-01-11 MED ORDER — LEVOTHYROXINE SODIUM 175 MCG PO TABS
175.0000 ug | ORAL_TABLET | Freq: Every day | ORAL | 2 refills | Status: DC
Start: 2021-01-11 — End: 2021-03-26

## 2021-01-22 ENCOUNTER — Other Ambulatory Visit: Payer: Self-pay | Admitting: Family Medicine

## 2021-01-22 DIAGNOSIS — E1165 Type 2 diabetes mellitus with hyperglycemia: Secondary | ICD-10-CM

## 2021-02-11 ENCOUNTER — Telehealth: Payer: Self-pay

## 2021-02-11 NOTE — Telephone Encounter (Signed)
Can you check to see if we can keep him on sch for tomorrow? He did some labs 1/21

## 2021-02-11 NOTE — Telephone Encounter (Signed)
Pt had labs for CMP, TSH, T4 Free and total and an A1c performed. He did not have his Vitamin D, Thyroglobulin antibody or thyroid peroxidase. He has an appointment tomorrow.

## 2021-02-11 NOTE — Telephone Encounter (Signed)
We can still see him.

## 2021-02-12 ENCOUNTER — Ambulatory Visit: Payer: Medicare Other | Admitting: "Endocrinology

## 2021-03-19 LAB — HM DIABETES EYE EXAM

## 2021-03-25 ENCOUNTER — Other Ambulatory Visit: Payer: Self-pay | Admitting: Family Medicine

## 2021-03-25 DIAGNOSIS — E039 Hypothyroidism, unspecified: Secondary | ICD-10-CM

## 2021-04-12 ENCOUNTER — Other Ambulatory Visit: Payer: Self-pay | Admitting: Family Medicine

## 2021-04-12 DIAGNOSIS — E039 Hypothyroidism, unspecified: Secondary | ICD-10-CM

## 2021-04-16 ENCOUNTER — Encounter: Payer: Self-pay | Admitting: Family Medicine

## 2021-04-16 ENCOUNTER — Other Ambulatory Visit: Payer: Self-pay

## 2021-04-16 ENCOUNTER — Ambulatory Visit (INDEPENDENT_AMBULATORY_CARE_PROVIDER_SITE_OTHER): Payer: Medicare Other | Admitting: Family Medicine

## 2021-04-16 VITALS — BP 138/71 | HR 61 | Temp 97.8°F | Ht 76.0 in | Wt 230.8 lb

## 2021-04-16 DIAGNOSIS — Z794 Long term (current) use of insulin: Secondary | ICD-10-CM

## 2021-04-16 DIAGNOSIS — E039 Hypothyroidism, unspecified: Secondary | ICD-10-CM

## 2021-04-16 DIAGNOSIS — I10 Essential (primary) hypertension: Secondary | ICD-10-CM | POA: Diagnosis not present

## 2021-04-16 DIAGNOSIS — K219 Gastro-esophageal reflux disease without esophagitis: Secondary | ICD-10-CM

## 2021-04-16 DIAGNOSIS — I2510 Atherosclerotic heart disease of native coronary artery without angina pectoris: Secondary | ICD-10-CM | POA: Diagnosis not present

## 2021-04-16 DIAGNOSIS — E1165 Type 2 diabetes mellitus with hyperglycemia: Secondary | ICD-10-CM

## 2021-04-16 DIAGNOSIS — E782 Mixed hyperlipidemia: Secondary | ICD-10-CM | POA: Diagnosis not present

## 2021-04-16 DIAGNOSIS — K5900 Constipation, unspecified: Secondary | ICD-10-CM

## 2021-04-16 LAB — BAYER DCA HB A1C WAIVED: HB A1C (BAYER DCA - WAIVED): 6.5 % (ref <7.0)

## 2021-04-16 MED ORDER — LEVOTHYROXINE SODIUM 175 MCG PO TABS: 175.0000 ug | ORAL_TABLET | Freq: Every day | ORAL | 1 refills | Status: DC

## 2021-04-16 NOTE — Progress Notes (Signed)
Assessment & Plan:  1. Controlled type 2 diabetes mellitus with hyperglycemia, with long-term current use of insulin (HCC) A1c 6.5 today, improved from 7.0 12/23/2020. - Diabetes is at goal of A1c < 7. - Medications: continue current medications - Home glucose monitoring: continue monitoring with Dexcom - Patient is currently taking a statin. Patient is taking an ACE-inhibitor/ARB.   Diabetes Health Maintenance Due  Topic Date Due  . FOOT EXAM  07/05/2021  . HEMOGLOBIN A1C  07/10/2021  . OPHTHALMOLOGY EXAM  03/19/2022    - Lipid panel - CBC with Differential/Platelet - CMP14+EGFR - Bayer DCA Hb A1c Waived - Ambulatory referral to Endocrinology  2. Acquired hypothyroidism Labs to assess from last dose increase. - CMP14+EGFR - TSH - levothyroxine (SYNTHROID) 175 MCG tablet; Take 1 tablet (175 mcg total) by mouth daily before breakfast.  Dispense: 90 tablet; Refill: 1 - T4, Free  3. Essential hypertension Well controlled on current regimen.  - Lipid panel - CBC with Differential/Platelet - CMP14+EGFR  4. Coronary artery disease involving native coronary artery of native heart without angina pectoris On statin. - Lipid panel - CMP14+EGFR  5. Mixed hyperlipidemia Well controlled on current regimen.  - Lipid panel  6. Gastroesophageal reflux disease, unspecified whether esophagitis present Well controlled on current regimen.  - CMP14+EGFR  7. Constipation, unspecified constipation type Miralax 1 capful in 6-8 oz beverage of choice once daily as needed.    Return in about 3 months (around 07/16/2021) for annual physical.  [name], MSN, APRN, FNP-C [facility]  Subjective:    Patient ID: Jeffrey Shaw, male    DOB: Jan 28, 1951, 70 y.o.   MRN: [id number]  Patient Care Team: Gwenlyn Fudge, FNP as PCP - General (Family Medicine) Frederico Hamman, MD as Consulting Physician (Orthopedic Surgery) Gwenith Daily, RN as Case Manager    Chief Complaint:  Chief Complaint  Patient presents with  . Diabetes  . Hypertension    Check up of chronic medical conditions     HPI: [name] is a 70 y.o. male presenting on 04/16/2021 for Diabetes and Hypertension (Check up of chronic medical conditions )  Diabetes: Patient presents for follow up of diabetes. Current symptoms include: hyperglycemia and hypoglycemia . Known diabetic complications: cardiovascular disease. Medication compliance: yes. Current diet: in general, a "healthy" diet  . Current exercise: aerobics, bicycling, cardiovascular workout on exercise equipment, swimming, walking and weightlifting. He lifes weights 3x/week and walks for 45 minutes five days/week. Home blood sugar records: has the [facility] system. Is still having highs and lows that make him symptomatic. Is he  on ACE inhibitor or angiotensin II receptor blocker? Yes. Is he on a statin? Yes.   Hypothyroidism: patient's levothyroxine dosage was increased from 150 mcg to 175 mcg ~3 months ago.   GERD: Patient takes Protonix as needed, which works well for him.  Hyperlipidemia/CAD: Patient's last lipid panel was one year ago and was normal at that time.  He does take atorvastatin and a fish oil/flaxseed combination pill daily.  New complaints: Patient reports he has been dealing with constipation recently. He is taking 2 stool softeners twice daily and still requiring 3 tablespoons of caster oil occasionally.    Social history:  Relevant past medical, surgical, family and social history reviewed and updated as indicated. Interim medical history since our last visit reviewed.  Allergies and medications reviewed and updated.  DATA REVIEWED: CHART IN EPIC  ROS: Negative unless specifically indicated above in HPI.  Current Outpatient Medications:  .  aspirin EC 81 MG tablet, Take by mouth., Disp: , Rfl:  .  atorvastatin (LIPITOR) 40 MG tablet, Take 1 tablet (40 mg total) by mouth daily., Disp:  90 tablet, Rfl: 1 .  Continuous Blood Gluc Receiver (DEXCOM G6 RECEIVER) DEVI, 1 Device by Does not apply route daily., Disp: 1 each, Rfl: 0 .  Continuous Blood Gluc Sensor (DEXCOM G6 SENSOR) MISC, USE 1 SENSOR EVERY 10 DAYS, Disp: 3 each, Rfl: 0 .  Continuous Blood Gluc Transmit (DEXCOM G6 TRANSMITTER) MISC, 1 Device by Does not apply route every 3 (three) months., Disp: 1 each, Rfl: 3 .  famotidine (PEPCID) 20 MG tablet, Take 20 mg by mouth 2 (two) times daily., Disp: , Rfl:  .  insulin aspart (NOVOLOG) 100 UNIT/ML injection, Inject 15-20 Units into the skin 3 (three) times daily before meals. Patient uses home sliding scale, Disp: , Rfl:  .  Insulin Detemir (LEVEMIR FLEXTOUCH) 100 UNIT/ML Pen, Inject 30 Units into the skin at bedtime. , Disp: , Rfl:  .  levothyroxine (SYNTHROID) 175 MCG tablet, Take 1 tablet (175 mcg total) by mouth daily before breakfast., Disp: 90 tablet, Rfl: 3 .  lisinopril (ZESTRIL) 10 MG tablet, Take 1 tablet (10 mg total) by mouth daily., Disp: 90 tablet, Rfl: 1 .  Multiple Vitamin (MULTIVITAMIN) tablet, Take 1 tablet by mouth daily.  , Disp: , Rfl:  .  OVER THE COUNTER MEDICATION, Take 1 capsule by mouth 2 (two) times daily. 3-6-9 fish oil, flax seed combo, Disp: , Rfl:  .  Semaglutide, 1 MG/DOSE, (OZEMPIC, 1 MG/DOSE,) 2 MG/1.5ML SOPN, Inject 1 mg into the skin once a week., Disp: 6 pen, Rfl: 1 .  sildenafil (VIAGRA) 100 MG tablet, Take 1 tablet (100 mg total) by mouth daily as needed for erectile dysfunction., Disp: 30 tablet, Rfl: 2 .  triamcinolone cream (KENALOG) 0.1 %, Apply 1 application topically 2 (two) times daily., Disp: 80 g, Rfl: 0 .  zinc gluconate 50 MG tablet, Take 50 mg by mouth daily., Disp: , Rfl:  .  pantoprazole (PROTONIX) 40 MG tablet, Take 1 tablet (40 mg total) by mouth daily., Disp: 30 tablet, Rfl: 0   Allergies  Allergen Reactions  . Morphine And Related Nausea Only   Past Medical History:  Diagnosis Date  . Arthritis   . CAD (coronary  artery disease)    CABG 01/2008 /  Catheterization, November, 2012, LIMA to the LAD patent,  70% distal left main stenosis involving the proximal circumflex, DES to the left main continuing into the circumflex  . CORONARY ARTERY BYPASS GRAFT, HX OF 01/17/2010   Qualifier: Diagnosis of  By: Ron Parker, MD, Leonidas Romberg Dorinda Hill   . Diabetes mellitus    Dr. Tod Persia, [facility]  . GERD (gastroesophageal reflux disease)   . HTN (hypertension)   . 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 Hyperlipemia   . Hypothyroid   . Primary osteoarthritis of left hip 04/18/2016    Past Surgical History:  Procedure Laterality Date  . BACK SURGERY    . CARPAL TUNNEL RELEASE     left hand  . CORONARY ANGIOPLASTY    . CORONARY ARTERY BYPASS GRAFT  01/2008   X 6  . INGUINAL HERNIA REPAIR     right  . LEFT HEART CATHETERIZATION WITH CORONARY/GRAFT ANGIOGRAM N/A 10/30/2011   Procedure: LEFT HEART CATHETERIZATION WITH CORONARY/GRAFT ANGIOGRAM;  Surgeon: Hillary Bow, MD;  Location[facility] CATH LAB;  Service: Cardiovascular;  Laterality: N/A;  . LUMBAR DISC SURGERY     removed  . PERCUTANEOUS CORONARY STENT INTERVENTION (PCI-S) N/A 11/03/2011   Procedure: PERCUTANEOUS CORONARY STENT INTERVENTION (PCI-S);  Surgeon: Burnell Blanks, MD;  Location: [facility] CATH LAB;  Service: Cardiovascular;  Laterality: N/A;  . TONSILLECTOMY  ~ 1955  . TOTAL HIP ARTHROPLASTY Left 04/18/2016   Procedure: TOTAL HIP ARTHROPLASTY;  Surgeon: Earlie Server, MD;  Location: [facility];  Service: Orthopedics;  Laterality: Left;  . URETEROLITHOTOMY Left    with stone basket removal    Social History   Socioeconomic History  . Marital status: Married    Spouse name: SANDRA  . Number of children: 0  . Years of education: Not on file  . Highest education level: 12th grade  Occupational History  . Occupation: SELF EMPLOYED  Tobacco Use  . Smoking status: Former Smoker    Packs/day: 0.80    Years: 6.00     Pack years: 4.80    Types: Cigarettes    Quit date: 12/22/1968    Years since quitting: 52.3  . Smokeless tobacco: Never Used  Vaping Use  . Vaping Use: Never used  Substance and Sexual Activity  . Alcohol use: Yes    Alcohol/week: 2.0 standard drinks    Types: 2 Cans of beer per week  . Drug use: No  . Sexual activity: Yes  Other Topics Concern  . Not on file  Social History Narrative  . Not on file   Social Determinants of Health   Financial Resource Strain: Not on file  Food Insecurity: Not on file  Transportation Needs: Not on file  Physical Activity: Not on file  Stress: Not on file  Social Connections: Not on file  Intimate Partner Violence: Not on file        Objective:    BP 138/71   Pulse 61   Temp 97.8 F (36.6 C) (Temporal)   Ht $R'6\' 4"'gD$  (1.93 m)   Wt 230 lb 12.8 oz (104.7 kg)   SpO2 96%   BMI 28.09 kg/m   Wt Readings from Last 3 Encounters:  04/16/21 230 lb 12.8 oz (104.7 kg)  10/09/20 229 lb 12.8 oz (104.2 kg)  08/06/20 236 lb 3.2 oz (107.1 kg)    Physical Exam Vitals reviewed.  Constitutional:      General: He is not in acute distress.    Appearance: Normal appearance. He is overweight. He is not ill-appearing, toxic-appearing or diaphoretic.  HENT:     Head: Normocephalic and atraumatic.  Eyes:     General: No scleral icterus.       Right eye: No discharge.        Left eye: No discharge.     Conjunctiva/sclera: Conjunctivae normal.  Cardiovascular:     Rate and Rhythm: Normal rate and regular rhythm.     Heart sounds: Normal heart sounds. No murmur heard. No friction rub. No gallop.   Pulmonary:     Effort: Pulmonary effort is normal. No respiratory distress.     Breath sounds: Normal breath sounds. No stridor. No wheezing, rhonchi or rales.  Musculoskeletal:        General: Normal range of motion.     Cervical back: Normal range of motion.  Skin:    General: Skin is warm and dry.  Neurological:     Mental Status: He is alert and  oriented to person, place, and  time. Mental status is at baseline.  Psychiatric:        Mood and Affect: Mood normal.        Behavior: Behavior normal.        Thought Content: Thought content normal.        Judgment: Judgment normal.    Lab Results  Component Value Date   TSH 14.400 (H) 01/10/2021   Lab Results  Component Value Date   WBC 6.9 01/17/2020   HGB 14.2 01/17/2020   HCT 42.9 01/17/2020   MCV 93.3 01/17/2020   PLT 243 01/17/2020   Lab Results  Component Value Date   NA 134 01/10/2021   K 4.5 01/10/2021   CO2 24 01/10/2021   GLUCOSE 127 (H) 01/10/2021   BUN 14 01/10/2021   CREATININE 1.03 01/10/2021   BILITOT 1.5 (H) 01/10/2021   ALKPHOS 84 01/10/2021   AST 32 01/10/2021   ALT 24 01/10/2021   PROT 6.9 01/10/2021   ALBUMIN 4.3 01/10/2021   CALCIUM 9.2 01/10/2021   ANIONGAP 7 01/17/2020   Lab Results  Component Value Date   CHOL 134 04/05/2020   Lab Results  Component Value Date   HDL 51 04/05/2020   Lab Results  Component Value Date   LDLCALC 71 04/05/2020   Lab Results  Component Value Date   TRIG 52 04/05/2020   Lab Results  Component Value Date   CHOLHDL 2.6 04/05/2020   Lab Results  Component Value Date   HGBA1C 7.0 (H) 01/10/2021

## 2021-04-16 NOTE — Patient Instructions (Signed)
Miralax 1 capful in 6-8 oz beverage of choice once daily as needed.   

## 2021-04-17 LAB — LIPID PANEL
Chol/HDL Ratio: 2.4 ratio (ref 0.0–5.0)
Cholesterol, Total: 124 mg/dL (ref 100–199)
HDL: 52 mg/dL (ref >39)
LDL Chol Calc (NIH): 53 mg/dL (ref 0–99)
Triglycerides: 100 mg/dL (ref 0–149)
VLDL Cholesterol Cal: 19 mg/dL (ref 5–40)

## 2021-04-17 LAB — CBC WITH DIFFERENTIAL/PLATELET
Basophils Absolute: 0.1 10*3/uL (ref 0.0–0.2)
Basos: 1 %
EOS (ABSOLUTE): 0.2 10*3/uL (ref 0.0–0.4)
Eos: 3 %
Hematocrit: 39.9 % (ref 37.5–51.0)
Hemoglobin: 13.2 g/dL (ref 13.0–17.7)
Immature Grans (Abs): 0.1 10*3/uL (ref 0.0–0.1)
Immature Granulocytes: 1 %
Lymphocytes Absolute: 2.3 10*3/uL (ref 0.7–3.1)
Lymphs: 33 %
MCH: 30.8 pg (ref 26.6–33.0)
MCHC: 33.1 g/dL (ref 31.5–35.7)
MCV: 93 fL (ref 79–97)
Monocytes Absolute: 0.7 10*3/uL (ref 0.1–0.9)
Monocytes: 10 %
Neutrophils Absolute: 3.6 10*3/uL (ref 1.4–7.0)
Neutrophils: 52 %
Platelets: 250 10*3/uL (ref 150–450)
RBC: 4.29 x10E6/uL (ref 4.14–5.80)
RDW: 12.1 % (ref 11.6–15.4)
WBC: 7 10*3/uL (ref 3.4–10.8)

## 2021-04-17 LAB — CMP14+EGFR
ALT: 24 IU/L (ref 0–44)
AST: 25 IU/L (ref 0–40)
Albumin/Globulin Ratio: 1.7 (ref 1.2–2.2)
Albumin: 4.2 g/dL (ref 3.7–4.7)
Alkaline Phosphatase: 70 IU/L (ref 44–121)
BUN/Creatinine Ratio: 17 (ref 10–24)
BUN: 19 mg/dL (ref 8–27)
Bilirubin Total: 1.3 mg/dL — ABNORMAL HIGH (ref 0.0–1.2)
CO2: 21 mmol/L (ref 20–29)
Calcium: 9.6 mg/dL (ref 8.6–10.2)
Chloride: 105 mmol/L (ref 96–106)
Creatinine, Ser: 1.1 mg/dL (ref 0.76–1.27)
Globulin, Total: 2.5 g/dL (ref 1.5–4.5)
Glucose: 147 mg/dL — ABNORMAL HIGH (ref 65–99)
Potassium: 4.5 mmol/L (ref 3.5–5.2)
Sodium: 140 mmol/L (ref 134–144)
Total Protein: 6.7 g/dL (ref 6.0–8.5)
eGFR: 72 mL/min/{1.73_m2} (ref >59)

## 2021-04-17 LAB — T4, FREE: Free T4: 1.66 ng/dL (ref 0.82–1.77)

## 2021-04-17 LAB — TSH: TSH: 0.801 u[IU]/mL (ref 0.450–4.500)

## 2021-04-19 DIAGNOSIS — E119 Type 2 diabetes mellitus without complications: Secondary | ICD-10-CM | POA: Diagnosis not present

## 2021-04-19 DIAGNOSIS — H40033 Anatomical narrow angle, bilateral: Secondary | ICD-10-CM | POA: Diagnosis not present

## 2021-05-08 ENCOUNTER — Encounter: Payer: Self-pay | Admitting: Family Medicine

## 2021-05-08 DIAGNOSIS — K5900 Constipation, unspecified: Secondary | ICD-10-CM

## 2021-05-08 MED ORDER — LUBIPROSTONE 8 MCG PO CAPS: 8.0000 ug | ORAL_CAPSULE | Freq: Two times a day (BID) | ORAL | 2 refills | Status: DC

## 2021-05-09 ENCOUNTER — Other Ambulatory Visit: Payer: Self-pay | Admitting: Family Medicine

## 2021-05-09 DIAGNOSIS — K5900 Constipation, unspecified: Secondary | ICD-10-CM

## 2021-05-09 NOTE — Telephone Encounter (Signed)
Patient aware and verbalizes understanding. 

## 2021-05-09 NOTE — Telephone Encounter (Signed)
patient copay is $285 for generic-brand not covered. patient requests cheaper medication.

## 2021-05-09 NOTE — Telephone Encounter (Signed)
According to Epic, Jeffrey Shaw is preferred.  I discontinued it and sent Linzess.

## 2021-05-10 ENCOUNTER — Other Ambulatory Visit: Payer: Self-pay | Admitting: Family Medicine

## 2021-05-10 DIAGNOSIS — E1165 Type 2 diabetes mellitus with hyperglycemia: Secondary | ICD-10-CM

## 2021-05-13 ENCOUNTER — Other Ambulatory Visit: Payer: Self-pay | Admitting: Family Medicine

## 2021-05-13 DIAGNOSIS — K219 Gastro-esophageal reflux disease without esophagitis: Secondary | ICD-10-CM

## 2021-05-16 DIAGNOSIS — E7800 Pure hypercholesterolemia, unspecified: Secondary | ICD-10-CM | POA: Diagnosis not present

## 2021-05-16 DIAGNOSIS — E039 Hypothyroidism, unspecified: Secondary | ICD-10-CM | POA: Diagnosis not present

## 2021-05-16 DIAGNOSIS — I10 Essential (primary) hypertension: Secondary | ICD-10-CM | POA: Diagnosis not present

## 2021-05-16 DIAGNOSIS — E1165 Type 2 diabetes mellitus with hyperglycemia: Secondary | ICD-10-CM | POA: Diagnosis not present

## 2021-05-27 ENCOUNTER — Other Ambulatory Visit: Payer: Self-pay

## 2021-05-27 ENCOUNTER — Ambulatory Visit (HOSPITAL_COMMUNITY)
Admission: RE | Admit: 2021-05-27 | Discharge: 2021-05-27 | Disposition: A | Discharge status: Home / Self Care | Payer: Medicare Other | Source: Ambulatory Visit | Attending: Family Medicine | Admitting: Family Medicine

## 2021-05-27 ENCOUNTER — Ambulatory Visit (INDEPENDENT_AMBULATORY_CARE_PROVIDER_SITE_OTHER): Payer: Medicare Other | Admitting: Family Medicine

## 2021-05-27 ENCOUNTER — Encounter: Payer: Self-pay | Admitting: Family Medicine

## 2021-05-27 VITALS — BP 131/71 | HR 80 | Temp 97.2°F | Resp 20 | Ht 76.0 in | Wt 230.0 lb

## 2021-05-27 DIAGNOSIS — R109 Unspecified abdominal pain: Secondary | ICD-10-CM | POA: Diagnosis not present

## 2021-05-27 DIAGNOSIS — K5900 Constipation, unspecified: Secondary | ICD-10-CM | POA: Diagnosis not present

## 2021-05-27 DIAGNOSIS — R1032 Left lower quadrant pain: Secondary | ICD-10-CM | POA: Diagnosis not present

## 2021-05-27 MED ORDER — FLEET ENEMA 7-19 GM/118ML RE ENEM: 1.0000 | ENEMA | Freq: Every day | RECTAL | 1 refills | Status: DC | PRN

## 2021-05-27 MED ORDER — POLYETHYLENE GLYCOL 3350 17 GM/SCOOP PO POWD
17.0000 g | Freq: Two times a day (BID) | ORAL | 1 refills | Status: DC | PRN
Start: 2021-05-27 — End: 2022-03-03

## 2021-05-27 NOTE — Progress Notes (Signed)
BP 131/71   Pulse 80   Temp (!) 97.2 F (36.2 C) (Temporal)   Resp 20   Ht 6\' 4"  (1.93 m)   Wt 230 lb (104.3 kg)   SpO2 98%   BMI 28.00 kg/m    Subjective:   Patient ID: Jeffrey Shaw, male    DOB: 01-17-1951, 70 y.o.   MRN: [id number]  HPI: Jeffrey Shaw is a 70 y.o. male presenting on 05/27/2021 for Abdominal Pain and Episode of constipation and diarrhea   HPI Patient is coming in today complaining of left lower abdominal pain alternating diarrhea and constipation.  He says he will get backed up and feel constipated where he cannot go and then he will have a few episodes of diarrhea and the looser and then go back to being constipated.  This is been alternating over the past 10 days.  He says the pain on his left lower side has been worsening and not starting to come up a little bit on his left side from that left lower abdomen.  Patient has been using his Linzess and sometimes he does feel like it helps but sometimes not.  He did have a Cologuard that was negative last year but has been many years since he had a colonoscopy.  Relevant past medical, surgical, family and social history reviewed and updated as indicated. Interim medical history since our last visit reviewed. Allergies and medications reviewed and updated.  Review of Systems  Constitutional: Negative for appetite change, chills, fever and unexpected weight change.  Respiratory: Negative for shortness of breath and wheezing.   Cardiovascular: Negative for chest pain and leg swelling.  Gastrointestinal: Positive for abdominal pain, constipation and diarrhea. Negative for blood in stool, nausea and vomiting.  Genitourinary: Negative for dysuria and frequency.  Musculoskeletal: Negative for back pain and gait problem.  Skin: Negative for rash.  All other systems reviewed and are negative.   Per HPI unless specifically indicated above   Allergies as of 05/27/2021      Reactions   Morphine And Related Nausea Only       Medication List       Accurate as of May 27, 2021 11:48 AM. If you have any questions, ask your nurse or doctor.        STOP taking these medications   albuterol 108 (90 Base) MCG/ACT inhaler Commonly known as: VENTOLIN HFA Stopped by: May 29, 2021 Kylieann Eagles, MD     TAKE these medications   atorvastatin 40 MG tablet Commonly known as: LIPITOR TAKE 1 TABLET BY MOUTH DAILY.  (GENERIC FOR LIPITOR)   Dexcom G6 Receiver Devi 1 Device by Does not apply route daily.   Dexcom G6 Sensor Misc USE ONE SENSOR EVERY 10 DAYS   Dexcom G6 Transmitter Misc USE EVERY 3 MONTHS   famotidine 20 MG tablet Commonly known as: PEPCID Take 20 mg by mouth 2 (two) times daily.   insulin aspart 100 UNIT/ML injection Commonly known as: novoLOG Inject 0-30 Units into the skin 3 (three) times daily before meals.   Levemir FlexTouch 100 UNIT/ML FlexPen Generic drug: insulin detemir Inject 25 Units into the skin at bedtime. 8 units qam, 25 units qhs   levothyroxine 175 MCG tablet Commonly known as: SYNTHROID Take 1 tablet (175 mcg total) by mouth daily before breakfast.   linaclotide 72 MCG capsule Commonly known as: Linzess Take 1 capsule (72 mcg total) by mouth daily before breakfast.   lisinopril 10 MG tablet Commonly known as:  ZESTRIL TAKE 1 TABLET BY MOUTH DAILY. (GENERIC FOR ZESTRIL)   metFORMIN 500 MG 24 hr tablet Commonly known as: Glucophage XR Take 1 tablet (500 mg total) by mouth daily with breakfast.   multivitamin tablet Take 1 tablet by mouth daily.   OVER THE COUNTER MEDICATION Take 1 capsule by mouth 2 (two) times daily. 3-6-9 fish oil, flax seed combo   Ozempic (1 MG/DOSE) 4 MG/3ML Sopn Generic drug: Semaglutide (1 MG/DOSE) Inject 1 mg as directed once a week.   pantoprazole 40 MG tablet Commonly known as: PROTONIX Take 1 tablet by mouth once daily   polyethylene glycol powder 17 GM/SCOOP powder Commonly known as: GLYCOLAX/MIRALAX Take 17 g by mouth 2 (two)  times daily as needed. Started by: Nils Pyle, MD   sildenafil 100 MG tablet Commonly known as: VIAGRA Take 1 tablet (100 mg total) by mouth daily as needed for erectile dysfunction.   sodium phosphate 7-19 GM/118ML Enem Place 133 mLs (1 enema total) rectally daily as needed for moderate constipation or severe constipation. Started by: Nils Pyle, MD   vitamin C with rose hips 500 MG tablet Take 500 mg by mouth 2 (two) times daily.   zinc gluconate 50 MG tablet Take 50 mg by mouth daily.        Objective:   BP 131/71   Pulse 80   Temp (!) 97.2 F (36.2 C) (Temporal)   Resp 20   Ht 6\' 4"  (1.93 m)   Wt 230 lb (104.3 kg)   SpO2 98%   BMI 28.00 kg/m   Wt Readings from Last 3 Encounters:  05/27/21 230 lb (104.3 kg)  04/16/21 230 lb 12.8 oz (104.7 kg)  10/09/20 229 lb 12.8 oz (104.2 kg)    Physical Exam Vitals and nursing note reviewed.  Constitutional:      Appearance: Normal appearance.  Abdominal:     General: Abdomen is flat. Bowel sounds are normal.     Palpations: Abdomen is soft. There is no shifting dullness, hepatomegaly, splenomegaly or mass.     Tenderness: There is abdominal tenderness in the left lower quadrant. There is no right CVA tenderness, left CVA tenderness, guarding or rebound.     Hernia: A hernia is present. Hernia is present in the umbilical area (Small umbilical hernia, nontender and reducible).  Neurological:     Mental Status: He is alert.       Assessment & Plan:   Problem List Items Addressed This Visit   None   Visit Diagnoses    Left lower quadrant abdominal pain    -  Primary   Relevant Orders   DG Abd 1 View   Constipation, unspecified constipation type       Relevant Medications   polyethylene glycol powder (GLYCOLAX/MIRALAX) 17 GM/SCOOP powder   sodium phosphate (FLEET) 7-19 GM/118ML ENEM   Other Relevant Orders   DG Abd 1 View      Will try to do the MiraLAX twice a day and also continue with the  Linzess and give an enema once a day over the next few days, likely that he is constipated and backed up and is getting loose stools around that hard spot.  We will send over for KUB to see if it gives 10/11/20 any better answers.  If worsens or does not improve may consider CT scan versus GI referral Follow up plan: Return if symptoms worsen or fail to improve.  Counseling provided for all of the vaccine components  Orders Placed This Encounter  Procedures  . DG Abd 1 View    Arville Care, MD [facility] 05/27/2021, 11:48 AM

## 2021-05-28 ENCOUNTER — Ambulatory Visit (INDEPENDENT_AMBULATORY_CARE_PROVIDER_SITE_OTHER): Payer: Medicare Other

## 2021-05-28 DIAGNOSIS — Z0000 Encounter for general adult medical examination without abnormal findings: Secondary | ICD-10-CM

## 2021-05-28 NOTE — Patient Instructions (Signed)
  MEDICARE ANNUAL WELLNESS VISIT Health Maintenance Summary and Written Plan of Care  Mr. Wickens ,  Thank you for allowing me to perform your Medicare Annual Wellness Visit and for your ongoing commitment to your health.   Health Maintenance & Immunization History Health Maintenance  Topic Date Due  . Pneumococcal Vaccine 88-71 Years old (1 of 2 - PPSV23) Never done  . Zoster Vaccines- Shingrix (2 of 2) 11/23/2020  . FOOT EXAM  07/05/2021  . INFLUENZA VACCINE  07/22/2021  . HEMOGLOBIN A1C  10/16/2021  . OPHTHALMOLOGY EXAM  03/19/2022  . Fecal DNA (Cologuard)  08/14/2022  . TETANUS/TDAP  03/15/2023  . Hepatitis C Screening  Completed  . PNA vac Low Risk Adult  Completed  . HPV VACCINES  Aged Out  . COVID-19 Vaccine  Discontinued   Immunization History  Administered Date(s) Administered  . Influenza Split 10/07/2017  . Influenza,inj,quad, With Preservative 09/24/2018, 10/10/2019  . Influenza-Unspecified 10/13/2008, 10/06/2014, 10/05/2015, 09/24/2016, 09/24/2018, 10/05/2020  . Pneumococcal Conjugate-13 10/06/2014  . Pneumococcal Polysaccharide-23 10/09/1997, 01/07/2019  . Td 09/02/2004  . Tdap 03/14/2013  . Zoster Recombinat (Shingrix) 09/28/2020  . Zoster, Live 07/29/2013    These are the patient goals that we discussed: Goals Addressed   None      This is a list of Health Maintenance Items that are overdue or due now: Health Maintenance Due  Topic Date Due  . Pneumococcal Vaccine 35-43 Years old (1 of 2 - PPSV23) Never done  . Zoster Vaccines- Shingrix (2 of 2) 11/23/2020     Orders/Referrals Placed Today: No orders of the defined types were placed in this encounter.  (Contact our referral department at [phone] if you have not spoken with someone about your referral appointment within the next 5 days)    Follow-up Plan  Scheduled with [name], FNP on 07/19/21 at 11:05am.

## 2021-05-28 NOTE — Progress Notes (Signed)
MEDICARE ANNUAL WELLNESS VISIT  05/28/2021  Telephone Visit Disclaimer This Medicare AWV was conducted by telephone due to national recommendations for restrictions regarding the COVID-19 Pandemic (e.g. social distancing).  I verified, using two identifiers, that I am speaking with Jeffrey Shaw or their authorized healthcare agent. I discussed the limitations, risks, security, and privacy concerns of performing an evaluation and management service by telephone and the potential availability of an in-person appointment in the future. The patient expressed understanding and agreed to proceed.  Location of Patient: Home Location of Provider (nurse):  [facility]  Subjective:    Jeffrey Shaw is a 70 y.o. male patient of Gwenlyn FudgeJoyce, Britney F, FNP who had a Medicare Annual Wellness Visit today via telephone. Jeffrey Shaw is Working part time and lives with their spouse. he has no children. he reports that he is socially active and does interact with friends/family regularly. he is moderately physically active and enjoys working out and playing basketball.  Patient Care Team: Gwenlyn FudgeJoyce, Britney F, FNP as PCP - General (Family Medicine) Frederico Hammanaffrey, Daniel, MD as Consulting Physician (Orthopedic Surgery) Gwenith DailyHudy, Kristen N, RN as Case Manager  Advanced Directives 05/28/2021 04/04/2020 01/17/2020 04/19/2016 04/07/2016 10/29/2011  Does Patient Have a Medical Advance Directive? Yes No No No No Patient does not have advance directive;Patient would like information  Type of Advance Directive Healthcare Power of [place];Living will - - - - -  Does patient want to make changes to medical advance directive? No - Patient declined - - - - -  Would patient like information on creating a medical advance directive? - No - Patient declined - No - patient declined information No - patient declined information -    Hospital Utilization Over the Past 12 Months: # of hospitalizations or ER visits: 1 # of  surgeries: 0  Review of Systems    Patient reports that his overall health is worse compared to last year.  Gastrointestinal ROS: Patient seen by Dr. Louanne Skyeettinger for possible GI blockage. Pending xray results per patient.  Patient Reported Readings (BP, Pulse, CBG-99, Weight, etc)   Pain Assessment Pain : No/denies pain     Current Medications & Allergies (verified) Allergies as of 05/28/2021      Reactions   Morphine And Related Nausea Only      Medication List       Accurate as of May 28, 2021 10:35 AM. If you have any questions, ask your nurse or doctor.        atorvastatin 40 MG tablet Commonly known as: LIPITOR TAKE 1 TABLET BY MOUTH DAILY.  (GENERIC FOR LIPITOR)   Dexcom G6 Receiver Devi 1 Device by Does not apply route daily.   Dexcom G6 Sensor Misc USE ONE SENSOR EVERY 10 DAYS   Dexcom G6 Transmitter Misc USE EVERY 3 MONTHS   famotidine 20 MG tablet Commonly known as: PEPCID Take 20 mg by mouth 2 (two) times daily.   insulin aspart 100 UNIT/ML injection Commonly known as: novoLOG Inject 0-30 Units into the skin 3 (three) times daily before meals.   Levemir FlexTouch 100 UNIT/ML FlexPen Generic drug: insulin detemir Inject 25 Units into the skin at bedtime. 8 units qam, 25 units qhs   levothyroxine 175 MCG tablet Commonly known as: SYNTHROID Take 1 tablet (175 mcg total) by mouth daily before breakfast.   linaclotide 72 MCG capsule Commonly known as: Linzess Take 1 capsule (72 mcg total) by mouth daily before breakfast.   lisinopril 10  MG tablet Commonly known as: ZESTRIL TAKE 1 TABLET BY MOUTH DAILY. (GENERIC FOR ZESTRIL)   metFORMIN 500 MG 24 hr tablet Commonly known as: Glucophage XR Take 1 tablet (500 mg total) by mouth daily with breakfast.   multivitamin tablet Take 1 tablet by mouth daily.   OVER THE COUNTER MEDICATION Take 1 capsule by mouth 2 (two) times daily. 3-6-9 fish oil, flax seed combo   Ozempic (1 MG/DOSE) 4 MG/3ML  Sopn Generic drug: Semaglutide (1 MG/DOSE) Inject 1 mg as directed once a week.   pantoprazole 40 MG tablet Commonly known as: PROTONIX Take 1 tablet by mouth once daily   polyethylene glycol powder 17 GM/SCOOP powder Commonly known as: GLYCOLAX/MIRALAX Take 17 g by mouth 2 (two) times daily as needed.   sildenafil 100 MG tablet Commonly known as: VIAGRA Take 1 tablet (100 mg total) by mouth daily as needed for erectile dysfunction.   sodium phosphate 7-19 GM/118ML Enem Place 133 mLs (1 enema total) rectally daily as needed for moderate constipation or severe constipation.   vitamin C with rose hips 500 MG tablet Take 500 mg by mouth 2 (two) times daily.   zinc gluconate 50 MG tablet Take 50 mg by mouth daily.       History (reviewed): Past Medical History:  Diagnosis Date  . Arthritis   . CAD (coronary artery disease)    CABG 01/2008 /  Catheterization, November, 2012, LIMA to the LAD patent,  70% distal left main stenosis involving the proximal circumflex, DES to the left main continuing into the circumflex  . CORONARY ARTERY BYPASS GRAFT, HX OF 01/17/2010   Qualifier: Diagnosis of  By: Myrtis Ser, MD, Ruthann Cancer Lemmie Evens   . Diabetes mellitus    Dr. Nonie Hoyer, [facility]  . GERD (gastroesophageal reflux disease)   . HTN (hypertension)   . 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 Hyperlipemia   . Hypothyroid   . Primary osteoarthritis of left hip 04/18/2016   Past Surgical History:  Procedure Laterality Date  . BACK SURGERY    . CARPAL TUNNEL RELEASE     left hand  . CORONARY ANGIOPLASTY    . CORONARY ARTERY BYPASS GRAFT  01/2008   X 6  . INGUINAL HERNIA REPAIR     right  . LEFT HEART CATHETERIZATION WITH CORONARY/GRAFT ANGIOGRAM N/A 10/30/2011   Procedure: LEFT HEART CATHETERIZATION WITH Isabel Caprice;  Surgeon: Herby Abraham, MD;  Location: [facility] CATH LAB;  Service: Cardiovascular;  Laterality: N/A;  . LUMBAR DISC  SURGERY     removed  . PERCUTANEOUS CORONARY STENT INTERVENTION (PCI-S) N/A 11/03/2011   Procedure: PERCUTANEOUS CORONARY STENT INTERVENTION (PCI-S);  Surgeon: Kathleene Hazel, MD;  Location: [facility] CATH LAB;  Service: Cardiovascular;  Laterality: N/A;  . TONSILLECTOMY  ~ 1955  . TOTAL HIP ARTHROPLASTY Left 04/18/2016   Procedure: TOTAL HIP ARTHROPLASTY;  Surgeon: Frederico Hamman, MD;  Location: [facility] OR;  Service: Orthopedics;  Laterality: Left;  . URETEROLITHOTOMY Left    with stone basket removal   Family History  Problem Relation Age of Onset  . Dementia Mother   . Liver disease Mother   . Suicidality Father   . Emphysema Father   . Cancer Father   . Drug abuse Brother        Clean now  . Rheum arthritis Sister   . Heart attack Maternal Grandmother   . Emphysema Maternal Grandfather   .  Stroke Maternal Grandfather   . Stomach cancer Paternal Grandmother   . Diabetes Paternal Aunt   . Diabetes Paternal Aunt    Social History   Socioeconomic History  . Marital status: Married    Spouse name: SANDRA  . Number of children: 0  . Years of education: Not on file  . Highest education level: 12th grade  Occupational History  . Occupation: SELF EMPLOYED  Tobacco Use  . Smoking status: Former Smoker    Packs/day: 0.80    Years: 6.00    Pack years: 4.80    Types: Cigarettes    Quit date: 12/22/1968    Years since quitting: 52.4  . Smokeless tobacco: Never Used  Vaping Use  . Vaping Use: Never used  Substance and Sexual Activity  . Alcohol use: Yes    Alcohol/week: 2.0 standard drinks    Types: 2 Cans of beer per week  . Drug use: No  . Sexual activity: Yes  Other Topics Concern  . Not on file  Social History Narrative  . Not on file   Social Determinants of Health   Financial Resource Strain: Not on file  Food Insecurity: Not on file  Transportation Needs: Not on file  Physical Activity: Not on file  Stress: Not on file  Social Connections: Not on file     Activities of Daily Living In your present state of health, do you have any difficulty performing the following activities: 05/28/2021  Hearing? N  Vision? N  Difficulty concentrating or making decisions? N  Walking or climbing stairs? N  Dressing or bathing? N  Doing errands, shopping? N  Preparing Food and eating ? N  Using the Toilet? N  In the past six months, have you accidently leaked urine? N  Do you have problems with loss of bowel control? N  Managing your Medications? N  Managing your Finances? N  Housekeeping or managing your Housekeeping? N  Some recent data might be hidden    Patient Education/ Literacy How often do you need to have someone help you when you read instructions, pamphlets, or other written materials from your doctor or pharmacy?: 1 - Never What is the last grade level you completed in school?: 12th  Exercise Current Exercise Habits: Structured exercise class, Type of exercise: strength training/weights;stretching;walking, Time (Minutes): 60, Frequency (Times/Week): 5, Weekly Exercise (Minutes/Week): 300, Intensity: Moderate  Diet Patient reports consuming 2 meals a day and 2 snack(s) a day Patient reports that his primary diet is: Regular Patient reports that she does have regular access to food.   Depression Screen PHQ 2/9 Scores 05/28/2021 05/27/2021 04/16/2021 07/05/2020 04/05/2020 04/04/2020 03/01/2020  PHQ - 2 Score 0 0 0 0 0 0 0  PHQ- 9 Score - 0 - - - - -     Fall Risk Fall Risk  05/28/2021 05/27/2021 04/16/2021 07/05/2020 04/05/2020  Falls in the past year? 0 0 0 0 0  Follow up - Falls evaluation completed - - -     Objective:  Jeffrey Fess seemed alert and oriented and he participated appropriately during our telephone visit.  Blood Pressure Weight BMI  BP Readings from Last 3 Encounters:  05/27/21 131/71  04/16/21 138/71  12/19/20 (!) 153/81   Wt Readings from Last 3 Encounters:  05/27/21 230 lb (104.3 kg)  04/16/21 230 lb 12.8 oz (104.7  kg)  10/09/20 229 lb 12.8 oz (104.2 kg)   BMI Readings from Last 1 Encounters:  05/27/21 28.00 kg/m    *  Unable to obtain current vital signs, weight, and BMI due to telephone visit type  Hearing/Vision  . Mubarak did not seem to have difficulty with hearing/understanding during the telephone conversation . Reports that he has had a formal eye exam by an eye care professional within the past year . Reports that he has not had a formal hearing evaluation within the past year *Unable to fully assess hearing and vision during telephone visit type  Cognitive Function: 6CIT Screen 05/28/2021 04/04/2020  What Year? 0 points 0 points  What month? 0 points 0 points  What time? 0 points 0 points  Count back from 20 0 points 0 points  Months in reverse 0 points 0 points  Repeat phrase 0 points 0 points  Total Score 0 0   (Normal:0-7, Significant for Dysfunction: >8)  Normal Cognitive Function Screening: Yes   Immunization & Health Maintenance Record Immunization History  Administered Date(s) Administered  . Influenza Split 10/07/2017  . Influenza,inj,quad, With Preservative 09/24/2018, 10/10/2019  . Influenza-Unspecified 10/13/2008, 10/06/2014, 10/05/2015, 09/24/2016, 09/24/2018, 10/05/2020  . Pneumococcal Conjugate-13 10/06/2014  . Pneumococcal Polysaccharide-23 10/09/1997, 01/07/2019  . Td 09/02/2004  . Tdap 03/14/2013  . Zoster Recombinat (Shingrix) 09/28/2020  . Zoster, Live 07/29/2013    Health Maintenance  Topic Date Due  . Pneumococcal Vaccine 34-9 Years old (1 of 2 - PPSV23) Never done  . Zoster Vaccines- Shingrix (2 of 2) 11/23/2020  . FOOT EXAM  07/05/2021  . INFLUENZA VACCINE  07/22/2021  . HEMOGLOBIN A1C  10/16/2021  . OPHTHALMOLOGY EXAM  03/19/2022  . Fecal DNA (Cologuard)  08/14/2022  . TETANUS/TDAP  03/15/2023  . Hepatitis C Screening  Completed  . PNA vac Low Risk Adult  Completed  . HPV VACCINES  Aged Out  . COVID-19 Vaccine  Discontinued       Assessment   This is a routine wellness examination for Jeffrey Fess.  Health Maintenance: Due or Overdue Health Maintenance Due  Topic Date Due  . Pneumococcal Vaccine 18-61 Years old (1 of 2 - PPSV23) Never done  . Zoster Vaccines- Shingrix (2 of 2) 11/23/2020    Jeffrey Fess does not need a referral for Community Assistance: Care Management:   no Social Work:    no Prescription Assistance:  no Nutrition/Diabetes Education:  no   Plan:  Personalized Goals Goals Addressed   None    Personalized Health Maintenance & Screening Recommendations  Pneumococcal vaccine and Shingrix  Lung Cancer Screening Recommended: no (Low Dose CT Chest recommended if Age 35-80 years, 30 pack-year currently smoking OR have quit w/in past 15 years) Hepatitis C Screening recommended: no HIV Screening recommended: no  Advanced Directives: Written information was not prepared per patient's request.  Referrals & Orders No orders of the defined types were placed in this encounter.   Follow-up Plan . Follow-up with Gwenlyn Fudge, FNP as planned . Schedule 07/19/21    I have personally reviewed and noted the following in the patient's chart:   . Medical and social history . Use of alcohol, tobacco or illicit drugs  . Current medications and supplements . Functional ability and status . Nutritional status . Physical activity . Advanced directives . List of other physicians . Hospitalizations, surgeries, and ER visits in previous 12 months . Vitals . Screenings to include cognitive, depression, and falls . Referrals and appointments  In addition, I have reviewed and discussed with Jeffrey Fess certain preventive protocols, quality metrics, and best practice recommendations. A written personalized  care plan for preventive services as well as general preventive health recommendations is available and can be mailed to the patient at his request.      Suzan Slick [facility]  [id number]/12/7492

## 2021-06-10 ENCOUNTER — Telehealth: Payer: Self-pay | Admitting: Family Medicine

## 2021-06-10 DIAGNOSIS — R1032 Left lower quadrant pain: Secondary | ICD-10-CM

## 2021-06-10 NOTE — Telephone Encounter (Signed)
Patient aware and verbalizes understanding. 

## 2021-06-10 NOTE — Telephone Encounter (Signed)
Patient was seen 05/27/2021 - please review and advise

## 2021-06-10 NOTE — Telephone Encounter (Signed)
Ordered CT abdomen pelvis for left lower quadrant abdominal pain, concern for diverticulitis possibly.

## 2021-06-10 NOTE — Telephone Encounter (Signed)
Pt called stating that he saw Dr Dettinger for diarrhea and constipation recently and was told by Dr Dettinger that if symptoms didn't get any better, to call and let him know and he would send pt for CT scan.  Pt says he is still having same symptoms and would like Dr Dettinger to go ahead and send order for CT.

## 2021-06-14 ENCOUNTER — Telehealth: Payer: Self-pay | Admitting: Family Medicine

## 2021-07-05 ENCOUNTER — Ambulatory Visit (HOSPITAL_COMMUNITY)
Admission: RE | Admit: 2021-07-05 | Discharge: 2021-07-05 | Disposition: A | Discharge status: Home / Self Care | Payer: Medicare Other | Source: Ambulatory Visit | Attending: Family Medicine | Admitting: Family Medicine

## 2021-07-05 ENCOUNTER — Other Ambulatory Visit: Payer: Self-pay

## 2021-07-05 DIAGNOSIS — K429 Umbilical hernia without obstruction or gangrene: Secondary | ICD-10-CM | POA: Diagnosis not present

## 2021-07-05 DIAGNOSIS — K3189 Other diseases of stomach and duodenum: Secondary | ICD-10-CM | POA: Diagnosis not present

## 2021-07-05 DIAGNOSIS — K6389 Other specified diseases of intestine: Secondary | ICD-10-CM | POA: Diagnosis not present

## 2021-07-05 DIAGNOSIS — K8020 Calculus of gallbladder without cholecystitis without obstruction: Secondary | ICD-10-CM | POA: Diagnosis not present

## 2021-07-05 DIAGNOSIS — R1032 Left lower quadrant pain: Secondary | ICD-10-CM | POA: Diagnosis not present

## 2021-07-05 LAB — POCT I-STAT CREATININE: Creatinine, Ser: 0.9 mg/dL (ref 0.61–1.24)

## 2021-07-05 MED ORDER — IOHEXOL 300 MG/ML  SOLN
100.0000 mL | Freq: Once | INTRAMUSCULAR | Status: AC | PRN
  Administered 2021-07-05: 100 mL via INTRAVENOUS

## 2021-07-19 ENCOUNTER — Encounter: Payer: Self-pay | Admitting: Family Medicine

## 2021-07-19 ENCOUNTER — Other Ambulatory Visit: Payer: Self-pay

## 2021-07-19 ENCOUNTER — Ambulatory Visit (INDEPENDENT_AMBULATORY_CARE_PROVIDER_SITE_OTHER): Payer: Medicare Other | Admitting: Family Medicine

## 2021-07-19 VITALS — BP 124/70 | HR 62 | Temp 97.2°F | Resp 20 | Ht 76.0 in | Wt 226.0 lb

## 2021-07-19 DIAGNOSIS — E1165 Type 2 diabetes mellitus with hyperglycemia: Secondary | ICD-10-CM

## 2021-07-19 DIAGNOSIS — E875 Hyperkalemia: Secondary | ICD-10-CM

## 2021-07-19 DIAGNOSIS — Z794 Long term (current) use of insulin: Secondary | ICD-10-CM

## 2021-07-19 DIAGNOSIS — I10 Essential (primary) hypertension: Secondary | ICD-10-CM | POA: Diagnosis not present

## 2021-07-19 DIAGNOSIS — I700 Atherosclerosis of aorta: Secondary | ICD-10-CM

## 2021-07-19 DIAGNOSIS — E782 Mixed hyperlipidemia: Secondary | ICD-10-CM

## 2021-07-19 DIAGNOSIS — R972 Elevated prostate specific antigen [PSA]: Secondary | ICD-10-CM

## 2021-07-19 DIAGNOSIS — Z0001 Encounter for general adult medical examination with abnormal findings: Secondary | ICD-10-CM | POA: Diagnosis not present

## 2021-07-19 DIAGNOSIS — Z0000 Encounter for general adult medical examination without abnormal findings: Secondary | ICD-10-CM

## 2021-07-19 DIAGNOSIS — E039 Hypothyroidism, unspecified: Secondary | ICD-10-CM

## 2021-07-19 LAB — BAYER DCA HB A1C WAIVED: HB A1C (BAYER DCA - WAIVED): 6.3 % (ref <7.0)

## 2021-07-19 NOTE — Progress Notes (Signed)
Assessment & Plan:  1. Well adult exam Preventive health education provided.  - CBC with Differential/Platelet - CMP14+EGFR - PSA, total and free  2. Controlled type 2 diabetes mellitus with hyperglycemia, with long-term current use of insulin (HCC) Managed by Dr. Talmage Nap, endocrinologist. Lab Results  Component Value Date   HGBA1C 6.3 07/19/2021   HGBA1C 6.5 04/16/2021   HGBA1C 7.0 (H) 01/10/2021    - Diabetes is at goal of A1c < 7. - Medications: continue current medications - Home glucose monitoring: continue using DEXCOM - Patient is currently taking a statin. Patient is taking an ACE-inhibitor/ARB.   Diabetes Health Maintenance Due  Topic Date Due   HEMOGLOBIN A1C  01/19/2022   OPHTHALMOLOGY EXAM  03/19/2022   FOOT EXAM  07/19/2022    - CBC with Differential/Platelet - CMP14+EGFR - Bayer DCA Hb A1c Waived  3. Acquired hypothyroidism Well controlled on current regimen.  - CBC with Differential/Platelet  4. Essential hypertension Well controlled on current regimen.  - CBC with Differential/Platelet - CMP14+EGFR  5. Mixed hyperlipidemia Well controlled on current regimen.  - CBC with Differential/Platelet  6. Aortic atherosclerosis (HCC) On a statin.   Follow-up: Return in about 6 months (around 01/19/2022) for follow-up of chronic medication conditions.   Jeffrey Boston, MSN, APRN, FNP-C [facility]  Subjective:  Patient ID: Jeffrey Shaw, male    DOB: 1951/08/26  Age: 70 y.o. MRN: [id number]  Patient Care Team: Gwenlyn Fudge, FNP as PCP - General (Family Medicine) Frederico Hamman, MD as Consulting Physician (Orthopedic Surgery) Gwenith Daily, RN as Case Manager   CC:  Chief Complaint  Patient presents with   Annual Exam   Hypertension   Diabetes   Hypothyroidism    HPI Jeffrey Shaw presents for his annual physical.  Occupation: Works at the [employer], Marital status: Married, Substance use: None Diet: Healthy, Exercise:  Lifting weights and walking 45 minutes daily Last eye exam: 03/19/2021 Last dental exam: A year ago Last colonoscopy: Cologuard negative on 08/15/2019 Hepatitis C Screening: Negative on 04/19/2018 PSA: Unknown Immunizations: Flu Vaccine:  not flu season Tdap Vaccine: up to date  Shingrix Vaccine: has received one dose  COVID-19 Vaccine: declined Pneumonia Vaccine: up to date  Advanced Directives Patient does have advanced directives including living will and healthcare power of attorney. He does not have a copy in the electronic medical record, but did bring it with him today.   DEPRESSION SCREENING PHQ 2/9 Scores 07/19/2021 05/28/2021 05/27/2021 04/16/2021 07/05/2020 04/05/2020 04/04/2020  PHQ - 2 Score 0 0 0 0 0 0 0  PHQ- 9 Score 0 - 0 - - - -     Dr. Talmage Nap, endocrinologist, is managing patient's diabetes.   Arthritis pain: relieved with Tylenol Arthritis.   Constipation: controlled with Miralax once daily. Daily BM.    Review of Systems  Constitutional:  Negative for chills, fever, malaise/fatigue and weight loss.  HENT:  Negative for congestion, ear discharge, ear pain, nosebleeds, sinus pain, sore throat and tinnitus.   Eyes:  Negative for blurred vision, double vision, pain, discharge and redness.  Respiratory:  Negative for cough, shortness of breath and wheezing.   Cardiovascular:  Negative for chest pain, palpitations and leg swelling.  Gastrointestinal:  Negative for abdominal pain, constipation, diarrhea, heartburn, nausea and vomiting.  Genitourinary:  Negative for dysuria, frequency and urgency.  Musculoskeletal:  Positive for joint pain. Negative for myalgias.  Skin:  Negative for rash.  Neurological:  Negative for dizziness,  seizures, weakness and headaches.  Psychiatric/Behavioral:  Negative for depression, substance abuse and suicidal ideas. The patient is not nervous/anxious.     Current Outpatient Medications:    Ascorbic Acid (VITAMIN C WITH ROSE HIPS) 500 MG  tablet, Take 500 mg by mouth 2 (two) times daily., Disp: , Rfl:    atorvastatin (LIPITOR) 40 MG tablet, TAKE 1 TABLET BY MOUTH DAILY.  (GENERIC FOR LIPITOR), Disp: 90 tablet, Rfl: 1   Continuous Blood Gluc Receiver (DEXCOM G6 RECEIVER) DEVI, 1 Device by Does not apply route daily., Disp: 1 each, Rfl: 0   Continuous Blood Gluc Sensor (DEXCOM G6 SENSOR) MISC, USE ONE SENSOR EVERY 10 DAYS, Disp: 3 each, Rfl: 5   Continuous Blood Gluc Transmit (DEXCOM G6 TRANSMITTER) MISC, USE EVERY 3 MONTHS, Disp: 1 each, Rfl: 0   famotidine (PEPCID) 20 MG tablet, Take 20 mg by mouth 2 (two) times daily., Disp: , Rfl:    insulin aspart (NOVOLOG) 100 UNIT/ML injection, Inject 0-30 Units into the skin 3 (three) times daily before meals., Disp: , Rfl:    Insulin Detemir (LEVEMIR FLEXTOUCH) 100 UNIT/ML Pen, Inject 25 Units into the skin at bedtime. 8 units qam, 25 units qhs, Disp: , Rfl:    levothyroxine (SYNTHROID) 175 MCG tablet, Take 1 tablet (175 mcg total) by mouth daily before breakfast., Disp: 90 tablet, Rfl: 1   linaclotide (LINZESS) 72 MCG capsule, Take 1 capsule (72 mcg total) by mouth daily before breakfast., Disp: 30 capsule, Rfl: 2   lisinopril (ZESTRIL) 10 MG tablet, TAKE 1 TABLET BY MOUTH DAILY. (GENERIC FOR ZESTRIL), Disp: 90 tablet, Rfl: 0   metFORMIN (GLUCOPHAGE XR) 500 MG 24 hr tablet, Take 1 tablet (500 mg total) by mouth daily with breakfast., Disp: 90 tablet, Rfl: 1   Multiple Vitamin (MULTIVITAMIN) tablet, Take 1 tablet by mouth daily., Disp: , Rfl:    OVER THE COUNTER MEDICATION, Take 1 capsule by mouth 2 (two) times daily. 3-6-9 fish oil, flax seed combo, Disp: , Rfl:    OZEMPIC, 1 MG/DOSE, 4 MG/3ML SOPN, Inject 1 mg as directed once a week., Disp: , Rfl:    pantoprazole (PROTONIX) 40 MG tablet, Take 1 tablet by mouth once daily, Disp: 30 tablet, Rfl: 1   polyethylene glycol powder (GLYCOLAX/MIRALAX) 17 GM/SCOOP powder, Take 17 g by mouth 2 (two) times daily as needed., Disp: 3350 g, Rfl: 1    sildenafil (VIAGRA) 100 MG tablet, Take 1 tablet (100 mg total) by mouth daily as needed for erectile dysfunction., Disp: 30 tablet, Rfl: 2   zinc gluconate 50 MG tablet, Take 50 mg by mouth daily., Disp: , Rfl:    sodium phosphate (FLEET) 7-19 GM/118ML ENEM, Place 133 mLs (1 enema total) rectally daily as needed for moderate constipation or severe constipation. (Patient not taking: Reported on 07/19/2021), Disp: 399 mL, Rfl: 1  Allergies  Allergen Reactions   Morphine And Related Nausea Only    Past Medical History:  Diagnosis Date   Arthritis    CAD (coronary artery disease)    CABG 01/2008 /  Catheterization, November, 2012, LIMA to the LAD patent,  70% distal left main stenosis involving the proximal circumflex, DES to the left main continuing into the circumflex   CORONARY ARTERY BYPASS GRAFT, HX OF 01/17/2010   Qualifier: Diagnosis of  By: Ron Parker, MD, [facility], [place]    Diabetes mellitus    Dr. Tod Persia, [facility]   GERD (gastroesophageal reflux disease)    HTN (hypertension)    Hx of  CABG    2009, LIMA to LAD, right radial to PDA, SVG to diagonal, SVG to OM1   Hyperlipemia    Hypothyroid    Primary osteoarthritis of left hip 04/18/2016    Past Surgical History:  Procedure Laterality Date   BACK SURGERY     CARPAL TUNNEL RELEASE     left hand   CORONARY ANGIOPLASTY     CORONARY ARTERY BYPASS GRAFT  01/2008   X 6   INGUINAL HERNIA REPAIR     right   LEFT HEART CATHETERIZATION WITH CORONARY/GRAFT ANGIOGRAM N/A 10/30/2011   Procedure: LEFT HEART CATHETERIZATION WITH Beatrix Fetters;  Surgeon: Hillary Bow, MD;  Location: [facility] CATH LAB;  Service: Cardiovascular;  Laterality: N/A;   LUMBAR DISC SURGERY     removed   PERCUTANEOUS CORONARY STENT INTERVENTION (PCI-S) N/A 11/03/2011   Procedure: PERCUTANEOUS CORONARY STENT INTERVENTION (PCI-S);  Surgeon: Burnell Blanks, MD;  Location: [facility] CATH LAB;  Service: Cardiovascular;  Laterality: N/A;   TONSILLECTOMY  ~ [place] Left 04/18/2016   Procedure: TOTAL HIP ARTHROPLASTY;  Surgeon: Earlie Server, MD;  Location: [facility];  Service: Orthopedics;  Laterality: Left;   URETEROLITHOTOMY Left    with stone basket removal    Family History  Problem Relation Age of Onset   Dementia Mother    Liver disease Mother    Suicidality Father    Emphysema Father    Cancer Father    Drug abuse Brother        Clean now   Rheum arthritis Sister    Heart attack Maternal Grandmother    Emphysema Maternal Grandfather    Stroke Maternal Grandfather    Stomach cancer Paternal Grandmother    Diabetes Paternal Aunt    Diabetes Paternal Aunt     Social History   Socioeconomic History   Marital status: Married    Spouse name: SANDRA   Number of children: 0   Years of education: Not on file   Highest education level: 12th grade  Occupational History   Occupation: SELF EMPLOYED  Tobacco Use   Smoking status: Former    Packs/day: 0.80    Years: 6.00    Pack years: 4.80    Types: Cigarettes    Quit date: 12/22/1968    Years since quitting: 52.6   Smokeless tobacco: Never  Vaping Use   Vaping Use: Never used  Substance and Sexual Activity   Alcohol use: Yes    Alcohol/week: 2.0 standard drinks    Types: 2 Cans of beer per week   Drug use: No   Sexual activity: Yes  Other Topics Concern   Not on file  Social History Narrative   Not on file   Social Determinants of Health   Financial Resource Strain: Not on file  Food Insecurity: Not on file  Transportation Needs: Not on file  Physical Activity: Not on file  Stress: Not on file  Social Connections: Not on file  Intimate Partner Violence: Not on file      Objective:    BP 124/70   Pulse 62   Temp (!) 97.2 F (36.2 C)   Resp 20   Ht $R'6\' 4"'nL$  (1.93 m)   Wt 226 lb (102.5 kg)   SpO2 97%   BMI 27.51 kg/m   Wt Readings from Last 3 Encounters:  07/19/21 226 lb (102.5 kg)  05/27/21 230 lb (104.3 kg)  04/16/21 230 lb 12.8 oz  (104.7 kg)  Physical Exam Vitals reviewed.  Constitutional:      General: He is not in acute distress.    Appearance: Normal appearance. He is overweight. He is not ill-appearing, toxic-appearing or diaphoretic.  HENT:     Head: Normocephalic and atraumatic.     Right Ear: Tympanic membrane, ear canal and external ear normal. There is no impacted cerumen.     Left Ear: Tympanic membrane, ear canal and external ear normal. There is no impacted cerumen.     Nose: Nose normal. No congestion or rhinorrhea.     Mouth/Throat:     Mouth: Mucous membranes are moist.     Pharynx: Oropharynx is clear. No oropharyngeal exudate or posterior oropharyngeal erythema.  Eyes:     General: No scleral icterus.       Right eye: No discharge.        Left eye: No discharge.     Conjunctiva/sclera: Conjunctivae normal.     Pupils: Pupils are equal, round, and reactive to light.  Neck:     Vascular: No carotid bruit.  Cardiovascular:     Rate and Rhythm: Normal rate and regular rhythm.     Heart sounds: Normal heart sounds. No murmur heard.   No friction rub. No gallop.  Pulmonary:     Effort: Pulmonary effort is normal. No respiratory distress.     Breath sounds: Normal breath sounds. No stridor. No wheezing, rhonchi or rales.  Abdominal:     General: Abdomen is flat. Bowel sounds are normal. There is no distension.     Palpations: Abdomen is soft. There is no hepatomegaly, splenomegaly or mass.     Tenderness: There is no abdominal tenderness. There is no guarding or rebound.     Hernia: No hernia is present.  Musculoskeletal:        General: Normal range of motion.     Cervical back: Normal range of motion and neck supple. No rigidity. No muscular tenderness.     Right lower leg: No edema.     Left lower leg: No edema.  Lymphadenopathy:     Cervical: No cervical adenopathy.  Skin:    General: Skin is warm and dry.     Capillary Refill: Capillary refill takes less than 2 seconds.   Neurological:     General: No focal deficit present.     Mental Status: He is alert and oriented to person, place, and time. Mental status is at baseline.  Psychiatric:        Mood and Affect: Mood normal.        Behavior: Behavior normal.        Thought Content: Thought content normal.        Judgment: Judgment normal.    Lab Results  Component Value Date   TSH 0.801 04/16/2021   Lab Results  Component Value Date   WBC 6.4 07/19/2021   HGB 14.5 07/19/2021   HCT 43.7 07/19/2021   MCV 90 07/19/2021   PLT 266 07/19/2021   Lab Results  Component Value Date   NA 139 07/19/2021   K 5.8 (H) 07/19/2021   CO2 23 07/19/2021   GLUCOSE 94 07/19/2021   BUN 22 07/19/2021   CREATININE 1.18 07/19/2021   BILITOT 1.3 (H) 07/19/2021   ALKPHOS 65 07/19/2021   AST 27 07/19/2021   ALT 27 07/19/2021   PROT 6.9 07/19/2021   ALBUMIN 4.3 07/19/2021   CALCIUM 10.0 07/19/2021   ANIONGAP 7 01/17/2020   EGFR 66 07/19/2021  Lab Results  Component Value Date   CHOL 124 04/16/2021   Lab Results  Component Value Date   HDL 52 04/16/2021   Lab Results  Component Value Date   LDLCALC 53 04/16/2021   Lab Results  Component Value Date   TRIG 100 04/16/2021   Lab Results  Component Value Date   CHOLHDL 2.4 04/16/2021   Lab Results  Component Value Date   HGBA1C 6.3 07/19/2021

## 2021-07-20 LAB — CBC WITH DIFFERENTIAL/PLATELET
Basophils Absolute: 0.1 10*3/uL (ref 0.0–0.2)
Basos: 1 %
EOS (ABSOLUTE): 0.2 10*3/uL (ref 0.0–0.4)
Eos: 4 %
Hematocrit: 43.7 % (ref 37.5–51.0)
Hemoglobin: 14.5 g/dL (ref 13.0–17.7)
Immature Grans (Abs): 0 10*3/uL (ref 0.0–0.1)
Immature Granulocytes: 0 %
Lymphocytes Absolute: 2.2 10*3/uL (ref 0.7–3.1)
Lymphs: 34 %
MCH: 29.9 pg (ref 26.6–33.0)
MCHC: 33.2 g/dL (ref 31.5–35.7)
MCV: 90 fL (ref 79–97)
Monocytes Absolute: 0.5 10*3/uL (ref 0.1–0.9)
Monocytes: 8 %
Neutrophils Absolute: 3.4 10*3/uL (ref 1.4–7.0)
Neutrophils: 53 %
Platelets: 266 10*3/uL (ref 150–450)
RBC: 4.85 x10E6/uL (ref 4.14–5.80)
RDW: 12.4 % (ref 11.6–15.4)
WBC: 6.4 10*3/uL (ref 3.4–10.8)

## 2021-07-20 LAB — CMP14+EGFR
ALT: 27 IU/L (ref 0–44)
AST: 27 IU/L (ref 0–40)
Albumin/Globulin Ratio: 1.7 (ref 1.2–2.2)
Albumin: 4.3 g/dL (ref 3.7–4.7)
Alkaline Phosphatase: 65 IU/L (ref 44–121)
BUN/Creatinine Ratio: 19 (ref 10–24)
BUN: 22 mg/dL (ref 8–27)
Bilirubin Total: 1.3 mg/dL — ABNORMAL HIGH (ref 0.0–1.2)
CO2: 23 mmol/L (ref 20–29)
Calcium: 10 mg/dL (ref 8.6–10.2)
Chloride: 103 mmol/L (ref 96–106)
Creatinine, Ser: 1.18 mg/dL (ref 0.76–1.27)
Globulin, Total: 2.6 g/dL (ref 1.5–4.5)
Glucose: 94 mg/dL (ref 65–99)
Potassium: 5.8 mmol/L — ABNORMAL HIGH (ref 3.5–5.2)
Sodium: 139 mmol/L (ref 134–144)
Total Protein: 6.9 g/dL (ref 6.0–8.5)
eGFR: 66 mL/min/{1.73_m2} (ref >59)

## 2021-07-20 LAB — PSA, TOTAL AND FREE
PSA, Free Pct: 20.6 %
PSA, Free: 2.08 ng/mL
Prostate Specific Ag, Serum: 10.1 ng/mL — ABNORMAL HIGH (ref 0.0–4.0)

## 2021-07-21 ENCOUNTER — Encounter: Payer: Self-pay | Admitting: Family Medicine

## 2021-07-22 ENCOUNTER — Other Ambulatory Visit: Payer: Medicare Other

## 2021-07-22 ENCOUNTER — Other Ambulatory Visit: Payer: Self-pay | Admitting: *Deleted

## 2021-07-22 ENCOUNTER — Other Ambulatory Visit: Payer: Self-pay

## 2021-07-22 DIAGNOSIS — R972 Elevated prostate specific antigen [PSA]: Secondary | ICD-10-CM | POA: Diagnosis not present

## 2021-07-22 DIAGNOSIS — E875 Hyperkalemia: Secondary | ICD-10-CM | POA: Diagnosis not present

## 2021-07-22 DIAGNOSIS — E039 Hypothyroidism, unspecified: Secondary | ICD-10-CM

## 2021-07-22 MED ORDER — LISINOPRIL 10 MG PO TABS: ORAL_TABLET | ORAL | 1 refills | Status: DC

## 2021-07-22 MED ORDER — LEVOTHYROXINE SODIUM 175 MCG PO TABS
175.0000 ug | ORAL_TABLET | Freq: Every day | ORAL | 2 refills | Status: DC
Start: 2021-07-22 — End: 2021-12-19

## 2021-07-22 MED ORDER — ATORVASTATIN CALCIUM 40 MG PO TABS: ORAL_TABLET | ORAL | 1 refills | Status: DC

## 2021-07-23 ENCOUNTER — Other Ambulatory Visit: Payer: Self-pay | Admitting: Family Medicine

## 2021-07-23 DIAGNOSIS — R972 Elevated prostate specific antigen [PSA]: Secondary | ICD-10-CM

## 2021-07-23 LAB — BMP8+EGFR
BUN/Creatinine Ratio: 16 (ref 10–24)
BUN: 18 mg/dL (ref 8–27)
CO2: 24 mmol/L (ref 20–29)
Calcium: 9.9 mg/dL (ref 8.6–10.2)
Chloride: 103 mmol/L (ref 96–106)
Creatinine, Ser: 1.16 mg/dL (ref 0.76–1.27)
Glucose: 70 mg/dL (ref 65–99)
Potassium: 5.3 mmol/L — ABNORMAL HIGH (ref 3.5–5.2)
Sodium: 138 mmol/L (ref 134–144)
eGFR: 67 mL/min/{1.73_m2} (ref >59)

## 2021-07-23 LAB — PSA, TOTAL AND FREE
PSA, Free Pct: 23.4 %
PSA, Free: 2.34 ng/mL
Prostate Specific Ag, Serum: 10 ng/mL — ABNORMAL HIGH (ref 0.0–4.0)

## 2021-08-08 ENCOUNTER — Other Ambulatory Visit: Payer: Self-pay | Admitting: Family Medicine

## 2021-08-14 ENCOUNTER — Ambulatory Visit: Payer: Medicare Other | Admitting: Urology

## 2021-08-14 ENCOUNTER — Encounter: Payer: Self-pay | Admitting: Urology

## 2021-08-14 ENCOUNTER — Other Ambulatory Visit: Payer: Self-pay

## 2021-08-14 ENCOUNTER — Other Ambulatory Visit: Payer: Self-pay | Admitting: Family Medicine

## 2021-08-14 ENCOUNTER — Encounter: Payer: Self-pay | Admitting: Family Medicine

## 2021-08-14 VITALS — BP 165/62 | HR 66

## 2021-08-14 DIAGNOSIS — R972 Elevated prostate specific antigen [PSA]: Secondary | ICD-10-CM

## 2021-08-14 DIAGNOSIS — E1165 Type 2 diabetes mellitus with hyperglycemia: Secondary | ICD-10-CM

## 2021-08-14 DIAGNOSIS — K4090 Unilateral inguinal hernia, without obstruction or gangrene, not specified as recurrent: Secondary | ICD-10-CM | POA: Diagnosis not present

## 2021-08-14 LAB — URINALYSIS, ROUTINE W REFLEX MICROSCOPIC
Bilirubin, UA: NEGATIVE
Glucose, UA: NEGATIVE
Leukocytes,UA: NEGATIVE
Nitrite, UA: NEGATIVE
Protein,UA: NEGATIVE
RBC, UA: NEGATIVE
Specific Gravity, UA: 1.02 (ref 1.005–1.030)
Urobilinogen, Ur: 0.2 mg/dL (ref 0.2–1.0)
pH, UA: 5.5 (ref 5.0–7.5)

## 2021-08-14 MED ORDER — LEVOFLOXACIN 750 MG PO TABS: 750.0000 mg | ORAL_TABLET | Freq: Once | ORAL | 0 refills | Status: AC

## 2021-08-14 NOTE — Patient Instructions (Addendum)
Appointment Time: arrive at 12:15pm Appointment Date: 09/11/2021  Location: [facility] Radiology Department   Prostate Biopsy Instructions  Stop all aspirin or blood thinners (aspirin, plavix, coumadin, warfarin, motrin, ibuprofen, advil, aleve, naproxen, naprosyn) for 7 days prior to the procedure.  If you have any questions about stopping these medications, please contact your primary care physician or cardiologist.  Having a light meal prior to the procedure is recommended.  If you are diabetic or have low blood sugar please bring a small snack or glucose tablet.  A Fleets enema is needed to be purchased over the counter at a local pharmacy and used 2 hours before you scheduled appointment.  This can be purchased over the counter at any pharmacy.  Antibiotics will be administered in the clinic at the time of the procedure and 1 tablet has been sent to your pharmacy. Please take the antibiotic as prescribed.    Please bring someone with you to the procedure to drive you home if you are given a valium to take prior to your procedure.   If you have any questions or concerns, please feel free to call the office at [phone] or send a Mychart message.    Thank you, [facility]     [facility] urology (11th ed., pp. [phone]). Elsevier.">  Transrectal Ultrasound-Guided Prostate Biopsy A transrectal ultrasound-guided prostate biopsy is a procedure to remove samples of prostate tissue for testing. The procedure uses ultrasound images to guide the process of removing the samples. The samples are taken to a lab to bechecked for prostate cancer. This procedure is usually done to evaluate the prostate gland of men who have raised (elevated) levels of prostate-specific antigen (PSA), which can be a sign of prostatecancer. Tell a health care provider about: Any allergies you have. All medicines you are taking, including vitamins, herbs, eye drops, creams, and  over-the-counter medicines. Any problems you or family members have had with anesthetic medicines. Any blood disorders you have. Any surgeries you have had. Any medical conditions you have. What are the risks? Generally, this is a safe procedure. However, problems may occur, including: Prostate infection. Bleeding from the rectum. Blood in the urine. Allergic reactions to medicines. Damage to surrounding structures such as blood vessels, organs, or muscles. Difficulty passing urine. Nerve damage. This is usually temporary. Pain. What happens before the procedure? Eating and drinking restrictions Follow instructions from your health care provider about eating and drinking. In most instances, you will not need to stop eating and drinking completelybefore the procedure. Medicines Ask your health care provider about: Changing or stopping your regular medicines. This is especially important if you are taking diabetes medicines or blood thinners. Taking medicines such as aspirin and ibuprofen. These medicines can thin your blood. Do not take these medicines unless your health care provider tells you to take them. Taking over-the-counter medicines, vitamins, herbs, and supplements. General instructions You will be given an enema. During an enema, a liquid is injected into your rectum to clear out waste. You may have a blood or urine sample taken. Plan to have a responsible adult take you home from the hospital or clinic. If you will be going home right after the procedure, plan to have a responsible adult care for you for the time you are told. This is important. Ask your health care provider what steps will be taken to help prevent infection. These steps may include: Washing skin with a germ-killing soap. Taking antibiotic medicine. What happens during the  procedure?  You will be given one or both of the following: A medicine to help you relax (sedative). A medicine to numb the area  (local anesthetic). You will be placed on your left side, and your knees may be bent. A probe with lubricated gel will be placed into your rectum, and images will be taken of your prostate and surrounding structures. Numbing medicine will be injected into your prostate. A biopsy needle will be inserted through your rectum and guided to your prostate using the ultrasound images. Prostate tissue samples will be removed, and the needle will then be removed. The biopsy samples will be sent to a lab to be tested. The procedure may vary among health care providers and hospitals. What happens after the procedure? Your blood pressure, heart rate, breathing rate, and blood oxygen level will be monitored until the medicines you were given have worn off. You may have some discomfort in the rectal area. You will be given pain medicine as needed. Do not drive for 24 hours if you received a sedative. Summary A transrectal ultrasound-guided biopsy removes samples of tissue from your prostate. This procedure is usually done to evaluate the prostate gland of men who have raised (elevated) levels of prostate-specific antigen (PSA), which can be a sign of prostate cancer. After your procedure, you may feel some discomfort in the rectal area. Plan to have a responsible adult take you home from the hospital or clinic. This information is not intended to replace advice given to you by your health care provider. Make sure you discuss any questions you have with your healthcare provider. Document Revised: 09/21/2020 Document Reviewed: 08/23/2020 Elsevier Patient Education  2022 [employer].

## 2021-08-14 NOTE — Progress Notes (Signed)
08/14/2021 11:39 AM   Jeffrey Shaw 07/21/1951 [id number]  Referring provider: Gwenlyn Fudge, FNP [address] [place],  [place] [address]  Elevated PSA   HPI: Mr Coupe is a 70yo here for evaluation of elevated PSA. He was seen by a urologist 20 years ago for elevated PSa and underwent a prostate biopsy which was normal. PSA was 10,  3 weeks ago. IPSS 11 QOL 2. No family hx of prostate cancer.     PMH: Past Medical History:  Diagnosis Date   Arthritis    CAD (coronary artery disease)    CABG 01/2008 /  Catheterization, November, 2012, LIMA to the LAD patent,  70% distal left main stenosis involving the proximal circumflex, DES to the left main continuing into the circumflex   CORONARY ARTERY BYPASS GRAFT, HX OF 01/17/2010   Qualifier: Diagnosis of  By: Myrtis Ser, MD, [facility], Lemmie Evens    Diabetes mellitus    Dr. Nonie Hoyer, [facility]   GERD (gastroesophageal reflux disease)    HTN (hypertension)    Hx of CABG    2009, LIMA to LAD, right radial to PDA, SVG to diagonal, SVG to OM1   Hyperlipemia    Hypothyroid    Primary osteoarthritis of left hip 04/18/2016    Surgical History: Past Surgical History:  Procedure Laterality Date   BACK SURGERY     CARPAL TUNNEL RELEASE     left hand   CORONARY ANGIOPLASTY     CORONARY ARTERY BYPASS GRAFT  01/2008   X 6   INGUINAL HERNIA REPAIR     right   LEFT HEART CATHETERIZATION WITH CORONARY/GRAFT ANGIOGRAM N/A 10/30/2011   Procedure: LEFT HEART CATHETERIZATION WITH Isabel Caprice;  Surgeon: Herby Abraham, MD;  Location: [facility] CATH LAB;  Service: Cardiovascular;  Laterality: N/A;   LUMBAR DISC SURGERY     removed   PERCUTANEOUS CORONARY STENT INTERVENTION (PCI-S) N/A 11/03/2011   Procedure: PERCUTANEOUS CORONARY STENT INTERVENTION (PCI-S);  Surgeon: Kathleene Hazel, MD;  Location: [facility] CATH LAB;  Service: Cardiovascular;  Laterality: N/A;   TONSILLECTOMY  ~ 1955   TOTAL HIP ARTHROPLASTY Left 04/18/2016   Procedure: TOTAL  HIP ARTHROPLASTY;  Surgeon: Frederico Hamman, MD;  Location: [facility] OR;  Service: Orthopedics;  Laterality: Left;   URETEROLITHOTOMY Left    with stone basket removal    Home Medications:  Allergies as of 08/14/2021       Reactions   Morphine And Related Nausea Only        Medication List        Accurate as of August 14, 2021 11:39 AM. If you have any questions, ask your nurse or doctor.          atorvastatin 40 MG tablet Commonly known as: LIPITOR TAKE 1 TABLET BY MOUTH DAILY.  (GENERIC FOR LIPITOR)   Dexcom G6 Receiver Devi 1 Device by Does not apply route daily.   Dexcom G6 Sensor Misc USE ONE SENSOR EVERY 10 DAYS   Dexcom G6 Transmitter Misc USE EVERY 3 MONTHS   famotidine 20 MG tablet Commonly known as: PEPCID Take 20 mg by mouth 2 (two) times daily.   insulin aspart 100 UNIT/ML injection Commonly known as: novoLOG Inject 0-30 Units into the skin 3 (three) times daily before meals.   Levemir FlexTouch 100 UNIT/ML FlexPen Generic drug: insulin detemir Inject 25 Units into the skin at bedtime. 8 units qam, 25 units qhs   levothyroxine 175 MCG tablet Commonly known as: SYNTHROID Take 1  tablet (175 mcg total) by mouth daily before breakfast.   linaclotide 72 MCG capsule Commonly known as: Linzess Take 1 capsule (72 mcg total) by mouth daily before breakfast.   lisinopril 10 MG tablet Commonly known as: ZESTRIL TAKE 1 TABLET BY MOUTH DAILY. (GENERIC FOR ZESTRIL)   metFORMIN 500 MG 24 hr tablet Commonly known as: Glucophage XR Take 1 tablet (500 mg total) by mouth daily with breakfast.   multivitamin tablet Take 1 tablet by mouth daily.   OVER THE COUNTER MEDICATION Take 1 capsule by mouth 2 (two) times daily. 3-6-9 fish oil, flax seed combo   Ozempic (1 MG/DOSE) 4 MG/3ML Sopn Generic drug: Semaglutide (1 MG/DOSE) Inject 1 mg as directed once a week.   pantoprazole 40 MG tablet Commonly known as: PROTONIX Take 1 tablet by mouth once daily    polyethylene glycol powder 17 GM/SCOOP powder Commonly known as: GLYCOLAX/MIRALAX Take 17 g by mouth 2 (two) times daily as needed.   sildenafil 100 MG tablet Commonly known as: VIAGRA TAKE 1 TABLET BY MOUTH ONCE DAILY AS NEEDED FOR  ERECTILE  DYSFUNCTION   sodium phosphate 7-19 GM/118ML Enem Place 133 mLs (1 enema total) rectally daily as needed for moderate constipation or severe constipation.   vitamin C with rose hips 500 MG tablet Take 500 mg by mouth 2 (two) times daily.   zinc gluconate 50 MG tablet Take 50 mg by mouth daily.        Allergies:  Allergies  Allergen Reactions   Morphine And Related Nausea Only    Family History: Family History  Problem Relation Age of Onset   Dementia Mother    Liver disease Mother    Suicidality Father    Emphysema Father    Cancer Father    Drug abuse Brother        Clean now   Rheum arthritis Sister    Heart attack Maternal Grandmother    Emphysema Maternal Grandfather    Stroke Maternal Grandfather    Stomach cancer Paternal Grandmother    Diabetes Paternal Aunt    Diabetes Paternal Aunt     Social History:  reports that he quit smoking about 52 years ago. His smoking use included cigarettes. He has a 4.80 pack-year smoking history. He has never used smokeless tobacco. He reports current alcohol use of about 2.0 standard drinks per week. He reports that he does not use drugs.  ROS: All other review of systems were reviewed and are negative except what is noted above in HPI  Physical Exam: BP (!) 165/62   Pulse 66   Constitutional:  Alert and oriented, No acute distress. HEENT: [place] AT, moist mucus membranes.  Trachea midline, no masses. Cardiovascular: No clubbing, cyanosis, or edema. Respiratory: Normal respiratory effort, no increased work of breathing. GI: Abdomen is soft, nontender, nondistended, no abdominal masses GU: No CVA tenderness. Circumcised phallus. No masses/lesions on penis, testis, scrotum. Prostate  40g smooth no nodules no induration.  Left inguinal hernia Lymph: No cervical or inguinal lymphadenopathy. Skin: No rashes, bruises or suspicious lesions. Neurologic: Grossly intact, no focal deficits, moving all 4 extremities. Psychiatric: Normal mood and affect.  Laboratory Data: Lab Results  Component Value Date   WBC 6.4 07/19/2021   HGB 14.5 07/19/2021   HCT 43.7 07/19/2021   MCV 90 07/19/2021   PLT 266 07/19/2021    Lab Results  Component Value Date   CREATININE 1.16 07/22/2021    No results found for: PSA  No results  found for: TESTOSTERONE  Lab Results  Component Value Date   HGBA1C 6.3 07/19/2021    Urinalysis    Component Value Date/Time   COLORURINE YELLOW 04/07/2016 1008   APPEARANCEUR CLEAR 04/07/2016 1008   LABSPEC 1.012 04/07/2016 1008   PHURINE 5.5 04/07/2016 1008   GLUCOSEU 500 (A) 04/07/2016 1008   HGBUR NEGATIVE 04/07/2016 1008   BILIRUBINUR NEGATIVE 04/07/2016 1008   KETONESUR NEGATIVE 04/07/2016 1008   PROTEINUR NEGATIVE 04/07/2016 1008   UROBILINOGEN 1.0 01/31/2008 0840   NITRITE NEGATIVE 04/07/2016 1008   LEUKOCYTESUR NEGATIVE 04/07/2016 1008    Lab Results  Component Value Date   LABMICR 23.6 07/06/2019   BACTERIA RARE 01/31/2008    Pertinent Imaging:  Results for orders placed during the hospital encounter of 05/27/21  DG Abd 1 View  Narrative CLINICAL DATA:  LEFT lower quadrant abdominal pain, on and off constipation and diarrhea  EXAM: ABDOMEN - 1 VIEW  COMPARISON:  None  FINDINGS: Normal bowel gas pattern.  No bowel dilatation or bowel wall thickening.  Degenerative disc disease changes thoracolumbar spine with minimal levoconvex scoliosis.  LEFT hip prosthesis.  No urinary tract calcification.  Lung bases clear.  IMPRESSION: Normal bowel gas pattern.   Electronically Signed By: Ulyses Southward M.D. On: 05/28/2021 09:26  No results found for this or any previous visit.  No results found for this or any  previous visit.  No results found for this or any previous visit.  No results found for this or any previous visit.  No results found for this or any previous visit.  No results found for this or any previous visit.  No results found for this or any previous visit.   Assessment & Plan:    1. Elevated PSA The patient and I talked about etiologies of elevated PSA.  We discussed the possible relationship between elevated PSA, prostate cancer, BPH, prostatitis, and UTI.   Conservative treatment of elevated PSA with watchful waiting was discussed with the patient.  All questions were answered.        All of the risks and benefits along with alternatives to prostate biopsy were discussed with the patient.  The patient gave fully informed consent to proceed with a transrectal ultrasound guided biopsy of the prostate for the evaluation of their evated PSA.  Prostate biopsy instructions and antibiotics were given to the patient.  - Urinalysis, Routine w reflex microscopic  2. Left inguinal hernia -Referral to Dr. Lovell Sheehan   No follow-ups on file.  Wilkie Aye, MD  [facility] [place]

## 2021-08-14 NOTE — Progress Notes (Signed)
Urological Symptom Review  Patient is experiencing the following symptoms: Trouble starting stream Weak stream Erection problems (male only) Kidney stones   Review of Systems  Gastrointestinal (upper)  : Negative for upper GI symptoms  Gastrointestinal (lower) : Diarrhea Constipation  Constitutional : Negative for symptoms  Skin: Negative for skin symptoms  Eyes: Negative for eye symptoms  Ear/Nose/Throat : Negative for Ear/Nose/Throat symptoms  Hematologic/Lymphatic: Negative for Hematologic/Lymphatic symptoms  Cardiovascular : Negative for cardiovascular symptoms  Respiratory : Negative for respiratory symptoms  Endocrine: Negative for endocrine symptoms  Musculoskeletal: Joint pain  Neurological: Negative for neurological symptoms  Psychologic: Negative for psychiatric symptoms

## 2021-08-15 ENCOUNTER — Other Ambulatory Visit: Payer: Self-pay | Admitting: Family Medicine

## 2021-08-15 DIAGNOSIS — E1165 Type 2 diabetes mellitus with hyperglycemia: Secondary | ICD-10-CM

## 2021-08-15 DIAGNOSIS — Z794 Long term (current) use of insulin: Secondary | ICD-10-CM

## 2021-08-16 DIAGNOSIS — E139 Other specified diabetes mellitus without complications: Secondary | ICD-10-CM | POA: Diagnosis not present

## 2021-08-16 DIAGNOSIS — I10 Essential (primary) hypertension: Secondary | ICD-10-CM | POA: Diagnosis not present

## 2021-08-16 DIAGNOSIS — E7800 Pure hypercholesterolemia, unspecified: Secondary | ICD-10-CM | POA: Diagnosis not present

## 2021-08-16 DIAGNOSIS — E039 Hypothyroidism, unspecified: Secondary | ICD-10-CM | POA: Diagnosis not present

## 2021-08-23 ENCOUNTER — Other Ambulatory Visit: Payer: Self-pay | Admitting: *Deleted

## 2021-08-29 ENCOUNTER — Encounter: Payer: Self-pay | Admitting: General Surgery

## 2021-08-29 ENCOUNTER — Other Ambulatory Visit: Payer: Self-pay

## 2021-08-29 ENCOUNTER — Ambulatory Visit: Payer: Medicare Other | Admitting: General Surgery

## 2021-08-29 VITALS — BP 165/63 | HR 68 | Temp 98.4°F | Resp 14 | Ht 76.0 in | Wt 227.0 lb

## 2021-08-29 DIAGNOSIS — K4090 Unilateral inguinal hernia, without obstruction or gangrene, not specified as recurrent: Secondary | ICD-10-CM

## 2021-08-29 DIAGNOSIS — K429 Umbilical hernia without obstruction or gangrene: Secondary | ICD-10-CM | POA: Diagnosis not present

## 2021-08-30 ENCOUNTER — Telehealth: Payer: Self-pay | Admitting: Family Medicine

## 2021-08-30 DIAGNOSIS — I2510 Atherosclerotic heart disease of native coronary artery without angina pectoris: Secondary | ICD-10-CM

## 2021-08-30 DIAGNOSIS — E1165 Type 2 diabetes mellitus with hyperglycemia: Secondary | ICD-10-CM

## 2021-08-30 DIAGNOSIS — E782 Mixed hyperlipidemia: Secondary | ICD-10-CM

## 2021-08-30 DIAGNOSIS — I700 Atherosclerosis of aorta: Secondary | ICD-10-CM

## 2021-08-30 MED ORDER — ATORVASTATIN CALCIUM 40 MG PO TABS: 40.0000 mg | ORAL_TABLET | Freq: Every day | ORAL | 1 refills | Status: DC

## 2021-08-30 NOTE — Telephone Encounter (Signed)
Fax received from the pharmacy regarding statin prescription. Patient has a bottle of atorvastatin 40 mg and the pharmacy has a prescription from April for rosuvastatin 10 mg that patient does not have. Clarification needed. Refill sent for atorvastatin 40 mg since that is on his medication list. Please let patient know I have sent this in.

## 2021-08-30 NOTE — Progress Notes (Signed)
Jeffrey Shaw; [id number]; 1951-11-24   HPI Patient is a 70 year old white male with multiple medical problems including coronary artery disease, GERD, hypertension who was referred to my care by [name] for evaluation treatment of both a left inguinal hernia as well as an umbilical hernia.  He states he has had a known umbilical hernia for some time.  Recently, he developed left groin pain and swelling.  Is made worse with straining.  He is able to reduce the left inguinal hernia.  He denies any nausea or vomiting. Past Medical History:  Diagnosis Date   Arthritis    CAD (coronary artery disease)    CABG 01/2008 /  Catheterization, November, 2012, LIMA to the LAD patent,  70% distal left main stenosis involving the proximal circumflex, DES to the left main continuing into the circumflex   CORONARY ARTERY BYPASS GRAFT, HX OF 01/17/2010   Qualifier: Diagnosis of  By: Myrtis Ser, MD, [facility], [facility]mie Evens    Diabetes mellitus    Dr. Nonie Hoyer, [facility]   GERD (gastroesophageal reflux disease)    HTN (hypertension)    Hx of CABG    2009, LIMA to LAD, right radial to PDA, SVG to diagonal, SVG to OM1   Hyperlipemia    Hypothyroid    Primary osteoarthritis of left hip 04/18/2016    Past Surgical History:  Procedure Laterality Date   BACK SURGERY     CARPAL TUNNEL RELEASE     left hand   CORONARY ANGIOPLASTY     CORONARY ARTERY BYPASS GRAFT  01/2008   X 6   INGUINAL HERNIA REPAIR     right   LEFT HEART CATHETERIZATION WITH CORONARY/GRAFT ANGIOGRAM N/A 10/30/2011   Procedure: LEFT HEART CATHETERIZATION WITH Isabel Caprice;  Surgeon: Herby Abraham, MD;  Location: [facility] CATH LAB;  Service: Cardiovascular;  Laterality: N/A;   LUMBAR DISC SURGERY     removed   PERCUTANEOUS CORONARY STENT INTERVENTION (PCI-S) N/A 11/03/2011   Procedure: PERCUTANEOUS CORONARY STENT INTERVENTION (PCI-S);  Surgeon: Kathleene Hazel, MD;  Location: [facility] CATH LAB;  Service: Cardiovascular;  Laterality: N/A;    TONSILLECTOMY  ~ 1955   TOTAL HIP ARTHROPLASTY Left 04/18/2016   Procedure: TOTAL HIP ARTHROPLASTY;  Surgeon: Frederico Hamman, MD;  Location: [facility] OR;  Service: Orthopedics;  Laterality: Left;   URETEROLITHOTOMY Left    with stone basket removal    Family History  Problem Relation Age of Onset   Dementia Mother    Liver disease Mother    Suicidality Father    Emphysema Father    Cancer Father    Drug abuse Brother        Clean now   Rheum arthritis Sister    Heart attack Maternal Grandmother    Emphysema Maternal Grandfather    Stroke Maternal Grandfather    Stomach cancer Paternal Grandmother    Diabetes Paternal Aunt    Diabetes Paternal Aunt     Current Outpatient Medications on File Prior to Visit  Medication Sig Dispense Refill   Ascorbic Acid (VITAMIN C WITH ROSE HIPS) 500 MG tablet Take 500 mg by mouth 2 (two) times daily.     atorvastatin (LIPITOR) 40 MG tablet TAKE 1 TABLET BY MOUTH DAILY.  (GENERIC FOR LIPITOR) 90 tablet 1   Continuous Blood Gluc Receiver (DEXCOM G6 RECEIVER) DEVI 1 Device by Does not apply route daily. 1 each 0   Continuous Blood Gluc Sensor (DEXCOM G6 SENSOR) MISC USE ONE SENSOR EVERY 10 DAYS 3 each 5  Continuous Blood Gluc Transmit (DEXCOM G6 TRANSMITTER) MISC USE EVERY 3 MONTHS 1 each 0   famotidine (PEPCID) 20 MG tablet Take 20 mg by mouth 2 (two) times daily.     insulin aspart (NOVOLOG) 100 UNIT/ML injection Inject 0-30 Units into the skin 3 (three) times daily before meals.     Insulin Detemir (LEVEMIR FLEXTOUCH) 100 UNIT/ML Pen Inject 25 Units into the skin at bedtime. 8 units qam, 25 units qhs     levothyroxine (SYNTHROID) 175 MCG tablet Take 1 tablet (175 mcg total) by mouth daily before breakfast. 90 tablet 2   linaclotide (LINZESS) 72 MCG capsule Take 1 capsule (72 mcg total) by mouth daily before breakfast. 30 capsule 2   lisinopril (ZESTRIL) 10 MG tablet TAKE 1 TABLET BY MOUTH DAILY. (GENERIC FOR ZESTRIL) 90 tablet 1   metFORMIN  (GLUCOPHAGE-XR) 500 MG 24 hr tablet Take 1 tablet by mouth once daily with breakfast 90 tablet 0   OVER THE COUNTER MEDICATION Take 1 capsule by mouth 2 (two) times daily. 3-6-9 fish oil, flax seed combo     OZEMPIC, 1 MG/DOSE, 4 MG/3ML SOPN Inject 1 mg as directed once a week.     pantoprazole (PROTONIX) 40 MG tablet Take 1 tablet by mouth once daily 30 tablet 1   polyethylene glycol powder (GLYCOLAX/MIRALAX) 17 GM/SCOOP powder Take 17 g by mouth 2 (two) times daily as needed. 3350 g 1   sildenafil (VIAGRA) 100 MG tablet TAKE 1 TABLET BY MOUTH ONCE DAILY AS NEEDED FOR  ERECTILE  DYSFUNCTION 30 tablet 2   sodium phosphate (FLEET) 7-19 GM/118ML ENEM Place 133 mLs (1 enema total) rectally daily as needed for moderate constipation or severe constipation. 399 mL 1   zinc gluconate 50 MG tablet Take 50 mg by mouth daily.     No current facility-administered medications on file prior to visit.    Allergies  Allergen Reactions   Morphine And Related Nausea Only    Social History   Substance and Sexual Activity  Alcohol Use Yes   Alcohol/week: 2.0 standard drinks   Types: 2 Cans of beer per week    Social History   Tobacco Use  Smoking Status Former   Packs/day: 0.80   Years: 6.00   Pack years: 4.80   Types: Cigarettes   Quit date: 12/22/1968   Years since quitting: 52.7  Smokeless Tobacco Never    Review of Systems  Constitutional: Negative.   HENT: Negative.    Eyes: Negative.   Respiratory: Negative.    Cardiovascular: Negative.   Gastrointestinal:  Positive for abdominal pain.  Genitourinary: Negative.   Musculoskeletal: Negative.   Skin: Negative.   Neurological: Negative.   Endo/Heme/Allergies: Negative.   Psychiatric/Behavioral: Negative.     Objective   Vitals:   08/29/21 0905  BP: (!) 165/63  Pulse: 68  Resp: 14  Temp: 98.4 F (36.9 C)  SpO2: 97%    Physical Exam Vitals reviewed.  Constitutional:      Appearance: Normal appearance. He is not  ill-appearing.  HENT:     Head: Normocephalic and atraumatic.  Cardiovascular:     Rate and Rhythm: Normal rate and regular rhythm.     Heart sounds: Normal heart sounds. No murmur heard.   No friction rub. No gallop.  Pulmonary:     Effort: Pulmonary effort is normal. No respiratory distress.     Breath sounds: Normal breath sounds. No stridor. No wheezing, rhonchi or rales.  Abdominal:  General: Bowel sounds are normal. There is no distension.     Palpations: Abdomen is soft. There is no mass.     Tenderness: There is no abdominal tenderness. There is no guarding or rebound.     Hernia: A hernia is present.     Comments: Reducible umbilical and left inguinal hernias.  Genitourinary:    Testes: Normal.  Skin:    General: Skin is warm and dry.  Neurological:     Mental Status: He is alert and oriented to person, place, and time.    Assessment  Umbilical hernia, left inguinal hernia Plan  Patient is scheduled for an umbilical herniorrhaphy with mesh as well as a left inguinal herniorrhaphy with mesh on 09/13/2021.  The risks and benefits of both procedures including bleeding, infection, mesh use, and the possibility of recurrence of the hernias were fully explained to the patient, who gave informed consent.

## 2021-08-30 NOTE — Telephone Encounter (Signed)
Patient aware and verbalizes understanding. 

## 2021-08-30 NOTE — H&P (Signed)
Jeffrey Shaw; [id number]; 1951-11-24   HPI Patient is a 70 year old white male with multiple medical problems including coronary artery disease, GERD, hypertension who was referred to my care by [name] for evaluation treatment of both a left inguinal hernia as well as an umbilical hernia.  He states he has had a known umbilical hernia for some time.  Recently, he developed left groin pain and swelling.  Is made worse with straining.  He is able to reduce the left inguinal hernia.  He denies any nausea or vomiting. Past Medical History:  Diagnosis Date   Arthritis    CAD (coronary artery disease)    CABG 01/2008 /  Catheterization, November, 2012, LIMA to the LAD patent,  70% distal left main stenosis involving the proximal circumflex, DES to the left main continuing into the circumflex   CORONARY ARTERY BYPASS GRAFT, HX OF 01/17/2010   Qualifier: Diagnosis of  By: Myrtis Ser, MD, [facility], Lemmie Evens    Diabetes mellitus    Dr. Nonie Hoyer, [facility]   GERD (gastroesophageal reflux disease)    HTN (hypertension)    Hx of CABG    2009, LIMA to LAD, right radial to PDA, SVG to diagonal, SVG to OM1   Hyperlipemia    Hypothyroid    Primary osteoarthritis of left hip 04/18/2016    Past Surgical History:  Procedure Laterality Date   BACK SURGERY     CARPAL TUNNEL RELEASE     left hand   CORONARY ANGIOPLASTY     CORONARY ARTERY BYPASS GRAFT  01/2008   X 6   INGUINAL HERNIA REPAIR     right   LEFT HEART CATHETERIZATION WITH CORONARY/GRAFT ANGIOGRAM N/A 10/30/2011   Procedure: LEFT HEART CATHETERIZATION WITH Isabel Caprice;  Surgeon: Herby Abraham, MD;  Location: [facility] CATH LAB;  Service: Cardiovascular;  Laterality: N/A;   LUMBAR DISC SURGERY     removed   PERCUTANEOUS CORONARY STENT INTERVENTION (PCI-S) N/A 11/03/2011   Procedure: PERCUTANEOUS CORONARY STENT INTERVENTION (PCI-S);  Surgeon: Kathleene Hazel, MD;  Location: [facility] CATH LAB;  Service: Cardiovascular;  Laterality: N/A;    TONSILLECTOMY  ~ 1955   TOTAL HIP ARTHROPLASTY Left 04/18/2016   Procedure: TOTAL HIP ARTHROPLASTY;  Surgeon: Frederico Hamman, MD;  Location: [facility] OR;  Service: Orthopedics;  Laterality: Left;   URETEROLITHOTOMY Left    with stone basket removal    Family History  Problem Relation Age of Onset   Dementia Mother    Liver disease Mother    Suicidality Father    Emphysema Father    Cancer Father    Drug abuse Brother        Clean now   Rheum arthritis Sister    Heart attack Maternal Grandmother    Emphysema Maternal Grandfather    Stroke Maternal Grandfather    Stomach cancer Paternal Grandmother    Diabetes Paternal Aunt    Diabetes Paternal Aunt     Current Outpatient Medications on File Prior to Visit  Medication Sig Dispense Refill   Ascorbic Acid (VITAMIN C WITH ROSE HIPS) 500 MG tablet Take 500 mg by mouth 2 (two) times daily.     atorvastatin (LIPITOR) 40 MG tablet TAKE 1 TABLET BY MOUTH DAILY.  (GENERIC FOR LIPITOR) 90 tablet 1   Continuous Blood Gluc Receiver (DEXCOM G6 RECEIVER) DEVI 1 Device by Does not apply route daily. 1 each 0   Continuous Blood Gluc Sensor (DEXCOM G6 SENSOR) MISC USE ONE SENSOR EVERY 10 DAYS 3 each 5  Continuous Blood Gluc Transmit (DEXCOM G6 TRANSMITTER) MISC USE EVERY 3 MONTHS 1 each 0   famotidine (PEPCID) 20 MG tablet Take 20 mg by mouth 2 (two) times daily.     insulin aspart (NOVOLOG) 100 UNIT/ML injection Inject 0-30 Units into the skin 3 (three) times daily before meals.     Insulin Detemir (LEVEMIR FLEXTOUCH) 100 UNIT/ML Pen Inject 25 Units into the skin at bedtime. 8 units qam, 25 units qhs     levothyroxine (SYNTHROID) 175 MCG tablet Take 1 tablet (175 mcg total) by mouth daily before breakfast. 90 tablet 2   linaclotide (LINZESS) 72 MCG capsule Take 1 capsule (72 mcg total) by mouth daily before breakfast. 30 capsule 2   lisinopril (ZESTRIL) 10 MG tablet TAKE 1 TABLET BY MOUTH DAILY. (GENERIC FOR ZESTRIL) 90 tablet 1   metFORMIN  (GLUCOPHAGE-XR) 500 MG 24 hr tablet Take 1 tablet by mouth once daily with breakfast 90 tablet 0   OVER THE COUNTER MEDICATION Take 1 capsule by mouth 2 (two) times daily. 3-6-9 fish oil, flax seed combo     OZEMPIC, 1 MG/DOSE, 4 MG/3ML SOPN Inject 1 mg as directed once a week.     pantoprazole (PROTONIX) 40 MG tablet Take 1 tablet by mouth once daily 30 tablet 1   polyethylene glycol powder (GLYCOLAX/MIRALAX) 17 GM/SCOOP powder Take 17 g by mouth 2 (two) times daily as needed. 3350 g 1   sildenafil (VIAGRA) 100 MG tablet TAKE 1 TABLET BY MOUTH ONCE DAILY AS NEEDED FOR  ERECTILE  DYSFUNCTION 30 tablet 2   sodium phosphate (FLEET) 7-19 GM/118ML ENEM Place 133 mLs (1 enema total) rectally daily as needed for moderate constipation or severe constipation. 399 mL 1   zinc gluconate 50 MG tablet Take 50 mg by mouth daily.     No current facility-administered medications on file prior to visit.    Allergies  Allergen Reactions   Morphine And Related Nausea Only    Social History   Substance and Sexual Activity  Alcohol Use Yes   Alcohol/week: 2.0 standard drinks   Types: 2 Cans of beer per week    Social History   Tobacco Use  Smoking Status Former   Packs/day: 0.80   Years: 6.00   Pack years: 4.80   Types: Cigarettes   Quit date: 12/22/1968   Years since quitting: 52.7  Smokeless Tobacco Never    Review of Systems  Constitutional: Negative.   HENT: Negative.    Eyes: Negative.   Respiratory: Negative.    Cardiovascular: Negative.   Gastrointestinal:  Positive for abdominal pain.  Genitourinary: Negative.   Musculoskeletal: Negative.   Skin: Negative.   Neurological: Negative.   Endo/Heme/Allergies: Negative.   Psychiatric/Behavioral: Negative.     Objective   Vitals:   08/29/21 0905  BP: (!) 165/63  Pulse: 68  Resp: 14  Temp: 98.4 F (36.9 C)  SpO2: 97%    Physical Exam Vitals reviewed.  Constitutional:      Appearance: Normal appearance. He is not  ill-appearing.  HENT:     Head: Normocephalic and atraumatic.  Cardiovascular:     Rate and Rhythm: Normal rate and regular rhythm.     Heart sounds: Normal heart sounds. No murmur heard.   No friction rub. No gallop.  Pulmonary:     Effort: Pulmonary effort is normal. No respiratory distress.     Breath sounds: Normal breath sounds. No stridor. No wheezing, rhonchi or rales.  Abdominal:  General: Bowel sounds are normal. There is no distension.     Palpations: Abdomen is soft. There is no mass.     Tenderness: There is no abdominal tenderness. There is no guarding or rebound.     Hernia: A hernia is present.     Comments: Reducible umbilical and left inguinal hernias.  Genitourinary:    Testes: Normal.  Skin:    General: Skin is warm and dry.  Neurological:     Mental Status: He is alert and oriented to person, place, and time.    Assessment  Umbilical hernia, left inguinal hernia Plan  Patient is scheduled for an umbilical herniorrhaphy with mesh as well as a left inguinal herniorrhaphy with mesh on 09/13/2021.  The risks and benefits of both procedures including bleeding, infection, mesh use, and the possibility of recurrence of the hernias were fully explained to the patient, who gave informed consent.

## 2021-09-05 NOTE — Patient Instructions (Signed)
Jeffrey Shaw  09/05/2021     @PREFPERIOPPHARMACY @   Your procedure is scheduled on  09/13/2021.   Report to 09/15/2021 at  0700  A.M.   Call this number if you have problems the morning of surgery:  [phone]   Remember:  Do not eat or drink after midnight.    Take 12.5 units of insulin the night before your procedure.    DO NOT take any medications for diabetes the morning of your procedure.      Take these medicines the morning of surgery with A SIP OF WATER             pepcid, levothyroxine, protonix.     Do not wear jewelry, make-up or nail polish.  Do not wear lotions, powders, or perfumes, or deodorant.  Do not shave 48 hours prior to surgery.  Men may shave face and neck.  Do not bring valuables to the hospital.  [facility] is not responsible for any belongings or valuables.  Contacts, dentures or bridgework may not be worn into surgery.  Leave your suitcase in the car.  After surgery it may be brought to your room.  For patients admitted to the hospital, discharge time will be determined by your treatment team.  Patients discharged the day of surgery will not be allowed to drive home and must have someone with them for 24 hours.    Special instructions:   DO NOT smoke tobacco or vape fore 24 hours before your procedure.  Please read over the following fact sheets that you were given. Pain Booklet, Coughing and Deep Breathing, Total Joint Packet, Surgical Site Infection Prevention, Anesthesia Post-op Instructions, and Care and Recovery After Surgery      Open Hernia Repair, Adult, Care After What can I expect after the procedure? After the procedure, it is common to have: Mild discomfort. Slight bruising. Mild swelling. Pain in the belly (abdomen). A small amount of blood from the cut from surgery (incision). Follow these instructions at home: Your doctor may give you more specific instructions. If you have problems, call your  doctor. Medicines Take over-the-counter and prescription medicines only as told by your doctor. If told, take steps to prevent problems with pooping (constipation). You may need to: Drink enough fluid to keep your pee (urine) pale yellow. Take medicines. You will be told what medicines to take. Eat foods that are high in fiber. These include beans, whole grains, and fresh fruits and vegetables. Limit foods that are high in fat and sugar. These include fried or sweet foods. Ask your doctor if you should avoid driving or using machines while you are taking your medicine. Incision care  Follow instructions from your doctor about how to take care of your incision. Make sure you: Wash your hands with soap and water for at least 20 seconds before and after you change your bandage (dressing). If you cannot use soap and water, use hand sanitizer. Change your bandage. Leave stitches or skin glue in place for at least 2 weeks. Leave tape strips alone unless you are told to take them off. You may trim the edges of the tape strips if they curl up. Check your incision every day for signs of infection. Check for: More redness, swelling, or pain. More fluid or blood. Warmth. Pus or a bad smell. Wear loose, soft clothing while your incision heals. Activity  Rest as told by your doctor. Do not lift anything  that is heavier than 10 lb (4.5 kg), or the limit that you are told. Do not play contact sports until your doctor says that this is safe. If you were given a sedative during your procedure, do not drive or use machines until your doctor says that it is safe. A sedative is a medicine that helps you relax. Return to your normal activities when your doctor says that it is safe. General instructions Do not take baths, swim, or use a hot tub. Ask your doctor about taking showers or sponge baths. Hold a pillow over your belly when you cough or sneeze. This helps with pain. Do not smoke or use any  products that contain nicotine or tobacco. If you need help quitting, ask your doctor. Keep all follow-up visits. Contact a doctor if: You have any of these signs of infection in or around your incision: More redness, swelling, or pain. More fluid or blood. Warmth. Pus. A bad smell. You have a fever or chills. You have blood in your poop (stool). You have not pooped (had a bowel movement) in 2-3 days. Medicine does not help your pain. Get help right away if: You have chest pain, or you are short of breath. You feel faint or light-headed. You have very bad pain. You vomit and your pain is worse. You have pain, swelling, or redness in a leg. These symptoms may be an emergency. Get help right away. Call your local emergency services (911 in the [place] Do not wait to see if the symptoms will go away. Do not drive yourself to the hospital. Summary After this procedure, it is common to have mild discomfort, slight bruising, and mild swelling. Follow instructions from your doctor about how to take care of your cut from surgery (incision). Check every day for signs of infection. Do not lift heavy objects or play contact sports until your doctor says it is safe. Return to your normal activities as told by your doctor. This information is not intended to replace advice given to you by your health care provider. Make sure you discuss any questions you have with your health care provider. Document Revised: 07/23/2020 Document Reviewed: 07/23/2020 Elsevier Patient Education  2022 Else[employer] Inc[employer] General Anesthesia, Adult, Care After This sheet gives you information about how to care for yourself after your procedure. Your health care provider may also give you more specific instructions. If you have problems or questions, contact your health care provider. What can I expect after the procedure? After the procedure, the following side effects are common: Pain or discomfort at the IV  site. Nausea. Vomiting. Sore throat. Trouble concentrating. Feeling cold or chills. Feeling weak or tired. Sleepiness and fatigue. Soreness and body aches. These side effects can affect parts of the body that were not involved in surgery. Follow these instructions at home: For the time period you were told by your health care provider:  Rest. Do not participate in activities where you could fall or become injured. Do not drive or use machinery. Do not drink alcohol. Do not take sleeping pills or medicines that cause drowsiness. Do not make important decisions or sign legal documents. Do not take care of children on your own. Eating and drinking Follow any instructions from your health care provider about eating or drinking restrictions. When you feel hungry, start by eating small amounts of foods that are soft and easy to digest (bland), such as toast. Gradually return to your regular diet. Drink enough fluid to keep your  urine pale yellow. If you vomit, rehydrate by drinking water, juice, or clear broth. General instructions If you have sleep apnea, surgery and certain medicines can increase your risk for breathing problems. Follow instructions from your health care provider about wearing your sleep device: Anytime you are sleeping, including during daytime naps. While taking prescription pain medicines, sleeping medicines, or medicines that make you drowsy. Have a responsible adult stay with you for the time you are told. It is important to have someone help care for you until you are awake and alert. Return to your normal activities as told by your health care provider. Ask your health care provider what activities are safe for you. Take over-the-counter and prescription medicines only as told by your health care provider. If you smoke, do not smoke without supervision. Keep all follow-up visits as told by your health care provider. This is important. Contact a health care  provider if: You have nausea or vomiting that does not get better with medicine. You cannot eat or drink without vomiting. You have pain that does not get better with medicine. You are unable to pass urine. You develop a skin rash. You have a fever. You have redness around your IV site that gets worse. Get help right away if: You have difficulty breathing. You have chest pain. You have blood in your urine or stool, or you vomit blood. Summary After the procedure, it is common to have a sore throat or nausea. It is also common to feel tired. Have a responsible adult stay with you for the time you are told. It is important to have someone help care for you until you are awake and alert. When you feel hungry, start by eating small amounts of foods that are soft and easy to digest (bland), such as toast. Gradually return to your regular diet. Drink enough fluid to keep your urine pale yellow. Return to your normal activities as told by your health care provider. Ask your health care provider what activities are safe for you. This information is not intended to replace advice given to you by your health care provider. Make sure you discuss any questions you have with your health care provider. Document Revised: 08/23/2020 Document Reviewed: 03/22/2020 Elsevier Patient Education  2022 Else[employer] Inc. How to Use Chlorhexidine for Bathing Chlorhexidine gluconate (CHG) is a germ-killing (antiseptic) solution that is used to clean the skin. It can get rid of the bacteria that normally live on the skin and can keep them away for about 24 hours. To clean your skin with CHG, you may be given: A CHG solution to use in the shower or as part of a sponge bath. A prepackaged cloth that contains CHG. Cleaning your skin with CHG may help lower the risk for infection: While you are staying in the intensive care unit of the hospital. If you have a vascular access, such as a central line, to provide short-term or  long-term access to your veins. If you have a catheter to drain urine from your bladder. If you are on a ventilator. A ventilator is a machine that helps you breathe by moving air in and out of your lungs. After surgery. What are the risks? Risks of using CHG include: A skin reaction. Hearing loss, if CHG gets in your ears and you have a perforated eardrum. Eye injury, if CHG gets in your eyes and is not rinsed out. The CHG product catching fire. Make sure that you avoid smoking and flames after applying  CHG to your skin. Do not use CHG: If you have a chlorhexidine allergy or have previously reacted to chlorhexidine. On babies younger than 59 months of age. How to use CHG solution Use CHG only as told by your health care provider, and follow the instructions on the label. Use the full amount of CHG as directed. Usually, this is one bottle. During a shower Follow these steps when using CHG solution during a shower (unless your health care provider gives you different instructions): Start the shower. Use your normal soap and shampoo to wash your face and hair. Turn off the shower or move out of the shower stream. Pour the CHG onto a clean washcloth. Do not use any type of brush or rough-edged sponge. Starting at your neck, lather your body down to your toes. Make sure you follow these instructions: If you will be having surgery, pay special attention to the part of your body where you will be having surgery. Scrub this area for at least 1 minute. Do not use CHG on your head or face. If the solution gets into your ears or eyes, rinse them well with water. Avoid your genital area. Avoid any areas of skin that have broken skin, cuts, or scrapes. Scrub your back and under your arms. Make sure to wash skin folds. Let the lather sit on your skin for 1-2 minutes or as long as told by your health care provider. Thoroughly rinse your entire body in the shower. Make sure that all body creases and  crevices are rinsed well. Dry off with a clean towel. Do not put any substances on your body afterward--such as powder, lotion, or perfume--unless you are told to do so by your health care provider. Only use lotions that are recommended by the manufacturer. Put on clean clothes or pajamas. If it is the night before your surgery, sleep in clean sheets.  During a sponge bath Follow these steps when using CHG solution during a sponge bath (unless your health care provider gives you different instructions): Use your normal soap and shampoo to wash your face and hair. Pour the CHG onto a clean washcloth. Starting at your neck, lather your body down to your toes. Make sure you follow these instructions: If you will be having surgery, pay special attention to the part of your body where you will be having surgery. Scrub this area for at least 1 minute. Do not use CHG on your head or face. If the solution gets into your ears or eyes, rinse them well with water. Avoid your genital area. Avoid any areas of skin that have broken skin, cuts, or scrapes. Scrub your back and under your arms. Make sure to wash skin folds. Let the lather sit on your skin for 1-2 minutes or as long as told by your health care provider. Using a different clean, wet washcloth, thoroughly rinse your entire body. Make sure that all body creases and crevices are rinsed well. Dry off with a clean towel. Do not put any substances on your body afterward--such as powder, lotion, or perfume--unless you are told to do so by your health care provider. Only use lotions that are recommended by the manufacturer. Put on clean clothes or pajamas. If it is the night before your surgery, sleep in clean sheets. How to use CHG prepackaged cloths Only use CHG cloths as told by your health care provider, and follow the instructions on the label. Use the CHG cloth on clean, dry skin. Do not  use the CHG cloth on your head or face unless your health  care provider tells you to. When washing with the CHG cloth: Avoid your genital area. Avoid any areas of skin that have broken skin, cuts, or scrapes. Before surgery Follow these steps when using a CHG cloth to clean before surgery (unless your health care provider gives you different instructions): Using the CHG cloth, vigorously scrub the part of your body where you will be having surgery. Scrub using a back-and-forth motion for 3 minutes. The area on your body should be completely wet with CHG when you are done scrubbing. Do not rinse. Discard the cloth and let the area air-dry. Do not put any substances on the area afterward, such as powder, lotion, or perfume. Put on clean clothes or pajamas. If it is the night before your surgery, sleep in clean sheets.  For general bathing Follow these steps when using CHG cloths for general bathing (unless your health care provider gives you different instructions). Use a separate CHG cloth for each area of your body. Make sure you wash between any folds of skin and between your fingers and toes. Wash your body in the following order, switching to a new cloth after each step: The front of your neck, shoulders, and chest. Both of your arms, under your arms, and your hands. Your stomach and groin area, avoiding the genitals. Your right leg and foot. Your left leg and foot. The back of your neck, your back, and your buttocks. Do not rinse. Discard the cloth and let the area air-dry. Do not put any substances on your body afterward--such as powder, lotion, or perfume--unless you are told to do so by your health care provider. Only use lotions that are recommended by the manufacturer. Put on clean clothes or pajamas. Contact a health care provider if: Your skin gets irritated after scrubbing. You have questions about using your solution or cloth. You swallow any chlorhexidine. Call your local poison control center (505-701-2175 in the [place] Get help  right away if: Your eyes itch badly, or they become very red or swollen. Your skin itches badly and is red or swollen. Your hearing changes. You have trouble seeing. You have swelling or tingling in your mouth or throat. You have trouble breathing. These symptoms may represent a serious problem that is an emergency. Do not wait to see if the symptoms will go away. Get medical help right away. Call your local emergency services (911 in the [place] Do not drive yourself to the hospital. Summary Chlorhexidine gluconate (CHG) is a germ-killing (antiseptic) solution that is used to clean the skin. Cleaning your skin with CHG may help to lower your risk for infection. You may be given CHG to use for bathing. It may be in a bottle or in a prepackaged cloth to use on your skin. Carefully follow your health care provider's instructions and the instructions on the product label. Do not use CHG if you have a chlorhexidine allergy. Contact your health care provider if your skin gets irritated after scrubbing. This information is not intended to replace advice given to you by your health care provider. Make sure you discuss any questions you have with your health care provider. Document Revised: 02/18/2021 Document Reviewed: 02/18/2021 Elsevier Patient Education  2022 [employer].

## 2021-09-10 ENCOUNTER — Encounter (HOSPITAL_COMMUNITY)
Admission: RE | Admit: 2021-09-10 | Discharge: 2021-09-10 | Disposition: A | Discharge status: Home / Self Care | Payer: Medicare Other | Source: Ambulatory Visit | Attending: General Surgery | Admitting: General Surgery

## 2021-09-10 ENCOUNTER — Other Ambulatory Visit: Payer: Self-pay

## 2021-09-10 VITALS — BP 140/66 | HR 69 | Temp 98.5°F | Resp 16 | Ht 76.0 in | Wt 227.0 lb

## 2021-09-10 DIAGNOSIS — Z794 Long term (current) use of insulin: Secondary | ICD-10-CM | POA: Insufficient documentation

## 2021-09-10 DIAGNOSIS — Z01812 Encounter for preprocedural laboratory examination: Secondary | ICD-10-CM | POA: Diagnosis not present

## 2021-09-10 DIAGNOSIS — E1165 Type 2 diabetes mellitus with hyperglycemia: Secondary | ICD-10-CM | POA: Diagnosis not present

## 2021-09-10 LAB — BASIC METABOLIC PANEL
Anion gap: 5 (ref 5–15)
BUN: 18 mg/dL (ref 8–23)
CO2: 25 mmol/L (ref 22–32)
Calcium: 9.2 mg/dL (ref 8.9–10.3)
Chloride: 105 mmol/L (ref 98–111)
Creatinine, Ser: 0.94 mg/dL (ref 0.61–1.24)
GFR, Estimated: >60 mL/min (ref >60)
Glucose, Bld: 139 mg/dL — ABNORMAL HIGH (ref 70–99)
Potassium: 3.8 mmol/L (ref 3.5–5.1)
Sodium: 135 mmol/L (ref 135–145)

## 2021-09-10 LAB — CBC WITH DIFFERENTIAL/PLATELET
Abs Immature Granulocytes: 0.04 10*3/uL (ref 0.00–0.07)
Basophils Absolute: 0.1 10*3/uL (ref 0.0–0.1)
Basophils Relative: 1 %
Eosinophils Absolute: 0.2 10*3/uL (ref 0.0–0.5)
Eosinophils Relative: 2 %
HCT: 40.6 % (ref 39.0–52.0)
Hemoglobin: 13.8 g/dL (ref 13.0–17.0)
Immature Granulocytes: 1 %
Lymphocytes Relative: 31 %
Lymphs Abs: 2.3 10*3/uL (ref 0.7–4.0)
MCH: 31.6 pg (ref 26.0–34.0)
MCHC: 34 g/dL (ref 30.0–36.0)
MCV: 92.9 fL (ref 80.0–100.0)
Monocytes Absolute: 0.6 10*3/uL (ref 0.1–1.0)
Monocytes Relative: 8 %
Neutro Abs: 4.3 10*3/uL (ref 1.7–7.7)
Neutrophils Relative %: 57 %
Platelets: 223 10*3/uL (ref 150–400)
RBC: 4.37 MIL/uL (ref 4.22–5.81)
RDW: 12.4 % (ref 11.5–15.5)
WBC: 7.4 10*3/uL (ref 4.0–10.5)
nRBC: 0 % (ref 0.0–0.2)

## 2021-09-10 LAB — HEMOGLOBIN A1C
Hgb A1c MFr Bld: 5.8 % — ABNORMAL HIGH (ref 4.8–5.6)
Mean Plasma Glucose: 119.76 mg/dL

## 2021-09-11 ENCOUNTER — Encounter (HOSPITAL_COMMUNITY): Payer: Self-pay

## 2021-09-11 ENCOUNTER — Other Ambulatory Visit: Payer: Self-pay | Admitting: Urology

## 2021-09-11 ENCOUNTER — Ambulatory Visit (INDEPENDENT_AMBULATORY_CARE_PROVIDER_SITE_OTHER): Payer: Medicare Other | Admitting: Urology

## 2021-09-11 ENCOUNTER — Encounter: Payer: Self-pay | Admitting: Urology

## 2021-09-11 ENCOUNTER — Ambulatory Visit (HOSPITAL_COMMUNITY)
Admission: RE | Admit: 2021-09-11 | Discharge: 2021-09-11 | Disposition: A | Discharge status: Home / Self Care | Payer: Medicare Other | Source: Ambulatory Visit | Attending: Urology | Admitting: Urology

## 2021-09-11 DIAGNOSIS — R972 Elevated prostate specific antigen [PSA]: Secondary | ICD-10-CM | POA: Insufficient documentation

## 2021-09-11 MED ORDER — GENTAMICIN SULFATE 40 MG/ML IJ SOLN: 80.0000 mg | Freq: Once | INTRAMUSCULAR | Status: AC

## 2021-09-11 MED ORDER — GENTAMICIN SULFATE 40 MG/ML IJ SOLN
INTRAMUSCULAR | Status: AC
  Administered 2021-09-11: 80 mg via INTRAMUSCULAR
  Filled 2021-09-11: qty 2

## 2021-09-11 MED ORDER — LIDOCAINE HCL (PF) 2 % IJ SOLN
INTRAMUSCULAR | Status: AC
  Administered 2021-09-11: 10 mL
  Filled 2021-09-11: qty 10

## 2021-09-11 MED ORDER — LIDOCAINE HCL (PF) 2 % IJ SOLN: 10.0000 mL | Freq: Once | INTRAMUSCULAR | Status: AC

## 2021-09-11 MED ORDER — GENTAMICIN SULFATE 40 MG/ML IJ SOLN: 160.0000 mg | Freq: Once | INTRAMUSCULAR | Status: DC

## 2021-09-11 NOTE — Progress Notes (Signed)
Prostate Biopsy Procedure   Informed consent was obtained after discussing risks/benefits of the procedure.  A time out was performed to ensure correct patient identity.  Pre-Procedure: - Last PSA Level: No results found for: PSA - Gentamicin given prophylactically - Levaquin 500 mg administered PO -Transrectal Ultrasound performed revealing a 101.4 gm prostate -No significant hypoechoic or median lobe noted  Procedure: - Prostate block performed using 10 cc 1% lidocaine and biopsies taken from sextant areas, a total of 12 under ultrasound guidance.  Post-Procedure: - Patient tolerated the procedure well - He was counseled to seek immediate medical attention if experiences any severe pain, significant bleeding, or fevers - Return in one week to discuss biopsy results

## 2021-09-11 NOTE — Patient Instructions (Signed)
Transrectal Ultrasound-Guided Prostate Biopsy, Care After This sheet gives you information about how to care for yourself after your procedure. Your health care provider may also give you more specific instructions. If you have problems or questions, contact your health care provider. What can I expect after the procedure? After the procedure, it is common to have: Pain and discomfort near your rectum, especially while sitting. Pink-colored urine due to small amounts of blood in your urine. A burning feeling while urinating. Blood in your stool (feces) or bleeding from your rectum. Blood in your semen. Follow these instructions at home: Medicines Take over-the-counter and prescription medicines only as told by your health care provider. If you were prescribed antibiotic medicine, take it as told by your health care provider. Do not stop taking the antibiotic even if you start to feel better. Activity Do not drive for 24 hours if you were given a medicine to help you relax (sedative) during your procedure. Return to your normal activities as told by your health care provider. Ask your health care provider what activities are safe for you. Ask your health care provider when it is okay for you to resume sexual activity. Do not lift anything that is heavier than 10 lb (4.5 kg), or the limit that you are told, until your health care provider says that it is safe. General instructions  Drink enough water to keep your urine pale yellow. Watch your urine, stool, and semen for new or increased bleeding. Keep all follow-up visits as told by your health care provider. This is important. Contact a health care provider if: You have any of the following: Blood clots in your urine or stool. Blood in your urine more than 2 weeks after the procedure. Blood in your semen more than 2 months after the procedure. New or increased bleeding in your urine, stool, or semen. Severe pain in your abdomen. Your  urine smells bad or unusual. You have trouble urinating. Your lower abdomen feels firm. You have problems getting an erection. You have nausea or you vomit. Get help right away if: You have a fever or chills. This could be a sign of infection. You have bright red urine. You have severe pain that does not get better with medicine. You cannot urinate. Summary After this procedure, it is common to have pain and discomfort around your rectum, especially while sitting. You may have blood in your urine and stool after the procedure. It is common to have blood in your semen for 1-2 months after this procedure. If you were prescribed antibiotic medicine, take it as told by your health care provider. Do not stop taking the antibiotic even if you start to feel better. Get help right away if you have a fever or chills. This could be a sign of infection. This information is not intended to replace advice given to you by your health care provider. Make sure you discuss any questions you have with your health care provider. Document Revised: 09/21/2020 Document Reviewed: 08/23/2020 Elsevier Patient Education  2022 [employer]  

## 2021-09-11 NOTE — Progress Notes (Signed)
PT tolerated prostate biopsy procedure and antibiotic injection well today. Labs obtained and sent for pathology. PT ambulatory at discharge with no acute distress noted and verbalized understanding of discharge instructions. PT to follow up with urologist as scheduled on 09/18/21.

## 2021-09-13 ENCOUNTER — Ambulatory Visit (HOSPITAL_COMMUNITY)
Admission: RE | Admit: 2021-09-13 | Discharge: 2021-09-13 | Disposition: A | Discharge status: Home / Self Care | Payer: Medicare Other | Attending: General Surgery | Admitting: General Surgery

## 2021-09-13 ENCOUNTER — Ambulatory Visit (HOSPITAL_COMMUNITY): Payer: Medicare Other | Admitting: Certified Registered"

## 2021-09-13 ENCOUNTER — Encounter (HOSPITAL_COMMUNITY)
Admission: RE | Disposition: A | Discharge status: Home / Self Care | Payer: Self-pay | Source: Home / Self Care | Attending: General Surgery

## 2021-09-13 ENCOUNTER — Encounter (HOSPITAL_COMMUNITY): Payer: Self-pay | Admitting: General Surgery

## 2021-09-13 DIAGNOSIS — Z79890 Hormone replacement therapy (postmenopausal): Secondary | ICD-10-CM | POA: Diagnosis not present

## 2021-09-13 DIAGNOSIS — Z951 Presence of aortocoronary bypass graft: Secondary | ICD-10-CM | POA: Diagnosis not present

## 2021-09-13 DIAGNOSIS — Z794 Long term (current) use of insulin: Secondary | ICD-10-CM | POA: Insufficient documentation

## 2021-09-13 DIAGNOSIS — I10 Essential (primary) hypertension: Secondary | ICD-10-CM | POA: Diagnosis not present

## 2021-09-13 DIAGNOSIS — Z79899 Other long term (current) drug therapy: Secondary | ICD-10-CM | POA: Diagnosis not present

## 2021-09-13 DIAGNOSIS — I2510 Atherosclerotic heart disease of native coronary artery without angina pectoris: Secondary | ICD-10-CM | POA: Insufficient documentation

## 2021-09-13 DIAGNOSIS — Z87891 Personal history of nicotine dependence: Secondary | ICD-10-CM | POA: Diagnosis not present

## 2021-09-13 DIAGNOSIS — K219 Gastro-esophageal reflux disease without esophagitis: Secondary | ICD-10-CM | POA: Diagnosis not present

## 2021-09-13 DIAGNOSIS — K429 Umbilical hernia without obstruction or gangrene: Secondary | ICD-10-CM | POA: Diagnosis not present

## 2021-09-13 DIAGNOSIS — Z885 Allergy status to narcotic agent status: Secondary | ICD-10-CM | POA: Diagnosis not present

## 2021-09-13 DIAGNOSIS — Z7984 Long term (current) use of oral hypoglycemic drugs: Secondary | ICD-10-CM | POA: Insufficient documentation

## 2021-09-13 DIAGNOSIS — K4090 Unilateral inguinal hernia, without obstruction or gangrene, not specified as recurrent: Secondary | ICD-10-CM

## 2021-09-13 DIAGNOSIS — Z955 Presence of coronary angioplasty implant and graft: Secondary | ICD-10-CM | POA: Diagnosis not present

## 2021-09-13 DIAGNOSIS — Z8249 Family history of ischemic heart disease and other diseases of the circulatory system: Secondary | ICD-10-CM | POA: Insufficient documentation

## 2021-09-13 DIAGNOSIS — K4091 Unilateral inguinal hernia, without obstruction or gangrene, recurrent: Secondary | ICD-10-CM

## 2021-09-13 HISTORY — PX: INGUINAL HERNIA REPAIR: SHX194

## 2021-09-13 HISTORY — PX: UMBILICAL HERNIA REPAIR: SHX196

## 2021-09-13 LAB — GLUCOSE, CAPILLARY
Glucose-Capillary: 178 mg/dL — ABNORMAL HIGH (ref 70–99)
Glucose-Capillary: 175 mg/dL — ABNORMAL HIGH (ref 70–99)
Glucose-Capillary: 141 mg/dL — ABNORMAL HIGH (ref 70–99)

## 2021-09-13 SURGERY — REPAIR, HERNIA, UMBILICAL, ADULT
Anesthesia: General | Site: Inguinal

## 2021-09-13 MED ORDER — ONDANSETRON HCL 4 MG/2ML IJ SOLN: 4.0000 mg | Freq: Once | INTRAMUSCULAR | Status: DC | PRN

## 2021-09-13 MED ORDER — FENTANYL CITRATE (PF) 100 MCG/2ML IJ SOLN
INTRAMUSCULAR | Status: DC | PRN
  Administered 2021-09-13 (×3): 50 ug via INTRAVENOUS
  Administered 2021-09-13: 100 ug via INTRAVENOUS

## 2021-09-13 MED ORDER — LIDOCAINE HCL (PF) 2 % IJ SOLN
INTRAMUSCULAR | Status: AC
  Filled 2021-09-13: qty 5

## 2021-09-13 MED ORDER — MIDAZOLAM HCL 2 MG/2ML IJ SOLN
INTRAMUSCULAR | Status: AC
  Filled 2021-09-13: qty 2

## 2021-09-13 MED ORDER — DEXAMETHASONE SODIUM PHOSPHATE 10 MG/ML IJ SOLN
INTRAMUSCULAR | Status: AC
  Filled 2021-09-13: qty 1

## 2021-09-13 MED ORDER — BUPIVACAINE HCL (300 MG DOSE) 3 X 100 MG IL IMPL
DRUG_IMPLANT | Status: DC | PRN
  Administered 2021-09-13: 200 mg
  Administered 2021-09-13: 50 mg

## 2021-09-13 MED ORDER — ONDANSETRON HCL 4 MG/2ML IJ SOLN
INTRAMUSCULAR | Status: DC | PRN
  Administered 2021-09-13: 4 mg via INTRAVENOUS

## 2021-09-13 MED ORDER — FENTANYL CITRATE (PF) 250 MCG/5ML IJ SOLN
INTRAMUSCULAR | Status: AC
  Filled 2021-09-13: qty 5

## 2021-09-13 MED ORDER — CHLORHEXIDINE GLUCONATE CLOTH 2 % EX PADS: 6.0000 | MEDICATED_PAD | Freq: Once | CUTANEOUS | Status: DC

## 2021-09-13 MED ORDER — FENTANYL CITRATE PF 50 MCG/ML IJ SOSY: 25.0000 ug | PREFILLED_SYRINGE | INTRAMUSCULAR | Status: DC | PRN

## 2021-09-13 MED ORDER — ROCURONIUM BROMIDE 10 MG/ML (PF) SYRINGE
PREFILLED_SYRINGE | INTRAVENOUS | Status: AC
  Filled 2021-09-13: qty 10

## 2021-09-13 MED ORDER — PROPOFOL 10 MG/ML IV BOLUS
INTRAVENOUS | Status: DC | PRN
  Administered 2021-09-13: 120 mg via INTRAVENOUS

## 2021-09-13 MED ORDER — TRAMADOL HCL 50 MG PO TABS: 50.0000 mg | ORAL_TABLET | Freq: Four times a day (QID) | ORAL | 0 refills | Status: DC | PRN

## 2021-09-13 MED ORDER — MIDAZOLAM HCL 5 MG/5ML IJ SOLN
INTRAMUSCULAR | Status: DC | PRN
  Administered 2021-09-13: 2 mg via INTRAVENOUS

## 2021-09-13 MED ORDER — SUCCINYLCHOLINE CHLORIDE 200 MG/10ML IV SOSY
PREFILLED_SYRINGE | INTRAVENOUS | Status: DC | PRN
  Administered 2021-09-13: 160 mg via INTRAVENOUS

## 2021-09-13 MED ORDER — PHENYLEPHRINE HCL (PRESSORS) 10 MG/ML IV SOLN
INTRAVENOUS | Status: DC | PRN
  Administered 2021-09-13: 120 ug via INTRAVENOUS
  Administered 2021-09-13 (×2): 80 ug via INTRAVENOUS

## 2021-09-13 MED ORDER — CHLORHEXIDINE GLUCONATE 0.12 % MT SOLN
OROMUCOSAL | Status: AC
  Administered 2021-09-13: 15 mL
  Filled 2021-09-13: qty 15

## 2021-09-13 MED ORDER — ONDANSETRON HCL 4 MG/2ML IJ SOLN
INTRAMUSCULAR | Status: AC
  Filled 2021-09-13: qty 2

## 2021-09-13 MED ORDER — PHENYLEPHRINE 40 MCG/ML (10ML) SYRINGE FOR IV PUSH (FOR BLOOD PRESSURE SUPPORT)
PREFILLED_SYRINGE | INTRAVENOUS | Status: AC
  Filled 2021-09-13: qty 10

## 2021-09-13 MED ORDER — LIDOCAINE 2% (20 MG/ML) 5 ML SYRINGE
INTRAMUSCULAR | Status: DC | PRN
  Administered 2021-09-13: 100 mg via INTRAVENOUS

## 2021-09-13 MED ORDER — SODIUM CHLORIDE 0.9 % IR SOLN
Status: DC | PRN
  Administered 2021-09-13: 1000 mL

## 2021-09-13 MED ORDER — CEFAZOLIN SODIUM-DEXTROSE 2-4 GM/100ML-% IV SOLN
2.0000 g | INTRAVENOUS | Status: AC
  Administered 2021-09-13: 2 g via INTRAVENOUS

## 2021-09-13 MED ORDER — PROPOFOL 10 MG/ML IV BOLUS
INTRAVENOUS | Status: AC
  Filled 2021-09-13: qty 20

## 2021-09-13 MED ORDER — SUCCINYLCHOLINE CHLORIDE 200 MG/10ML IV SOSY
PREFILLED_SYRINGE | INTRAVENOUS | Status: AC
  Filled 2021-09-13: qty 10

## 2021-09-13 MED ORDER — ROCURONIUM BROMIDE 100 MG/10ML IV SOLN
INTRAVENOUS | Status: DC | PRN
  Administered 2021-09-13: 10 mg via INTRAVENOUS
  Administered 2021-09-13: 50 mg via INTRAVENOUS

## 2021-09-13 MED ORDER — DEXAMETHASONE SODIUM PHOSPHATE 4 MG/ML IJ SOLN
INTRAMUSCULAR | Status: DC | PRN
Start: 2021-09-13 — End: 2021-09-13
  Administered 2021-09-13: 4 mg via INTRAVENOUS

## 2021-09-13 MED ORDER — KETOROLAC TROMETHAMINE 30 MG/ML IJ SOLN
15.0000 mg | Freq: Once | INTRAMUSCULAR | Status: AC
  Administered 2021-09-13: 15 mg via INTRAVENOUS
  Filled 2021-09-13: qty 1

## 2021-09-13 MED ORDER — CEFAZOLIN SODIUM-DEXTROSE 2-4 GM/100ML-% IV SOLN
INTRAVENOUS | Status: AC
  Filled 2021-09-13: qty 100

## 2021-09-13 MED ORDER — SUGAMMADEX SODIUM 200 MG/2ML IV SOLN
INTRAVENOUS | Status: DC | PRN
  Administered 2021-09-13: 200 mg via INTRAVENOUS

## 2021-09-13 MED ORDER — LACTATED RINGERS IV SOLN: INTRAVENOUS | Status: DC | PRN

## 2021-09-13 SURGICAL SUPPLY — 33 items
ADH SKN CLS APL DERMABOND .7 (GAUZE/BANDAGES/DRESSINGS) ×4
BLADE SURG SZ11 CARB STEEL (BLADE) ×3 IMPLANT
CLOTH BEACON ORANGE TIMEOUT ST (SAFETY) ×3 IMPLANT
COVER LIGHT HANDLE STERIS (MISCELLANEOUS) ×6 IMPLANT
DERMABOND ADVANCED (GAUZE/BANDAGES/DRESSINGS) ×2
DERMABOND ADVANCED .7 DNX12 (GAUZE/BANDAGES/DRESSINGS) ×4 IMPLANT
DRAIN PENROSE 0.5X18 (DRAIN) ×3 IMPLANT
ELECT REM PT RETURN 9FT ADLT (ELECTROSURGICAL) ×3
ELECTRODE REM PT RTRN 9FT ADLT (ELECTROSURGICAL) ×2 IMPLANT
GAUZE SPONGE 4X4 12PLY STRL (GAUZE/BANDAGES/DRESSINGS) ×3 IMPLANT
GLOVE SURG POLYISO LF SZ7.5 (GLOVE) ×3 IMPLANT
GLOVE SURG UNDER POLY LF SZ7 (GLOVE) ×9 IMPLANT
GOWN STRL REUS W/TWL LRG LVL3 (GOWN DISPOSABLE) ×9 IMPLANT
INST SET MINOR GENERAL (KITS) ×3 IMPLANT
KIT TURNOVER KIT A (KITS) ×3 IMPLANT
MANIFOLD NEPTUNE II (INSTRUMENTS) ×3 IMPLANT
MESH HERNIA 1.6X1.9 PLUG LRG (Mesh General) ×2 IMPLANT
MESH HERNIA PLUG LRG (Mesh General) ×1 IMPLANT
MESH VENTRALEX ST 1-7/10 CRC S (Mesh General) ×3 IMPLANT
NEEDLE HYPO 18GX1.5 BLUNT FILL (NEEDLE) ×3 IMPLANT
NS IRRIG 1000ML POUR BTL (IV SOLUTION) ×3 IMPLANT
PACK MINOR (CUSTOM PROCEDURE TRAY) ×3 IMPLANT
PAD ARMBOARD 7.5X6 YLW CONV (MISCELLANEOUS) ×3 IMPLANT
PENCIL SMOKE EVACUATOR (MISCELLANEOUS) ×3 IMPLANT
SET BASIN LINEN APH (SET/KITS/TRAYS/PACK) ×3 IMPLANT
SOL PREP PROV IODINE SCRUB 4OZ (MISCELLANEOUS) ×3 IMPLANT
SUT ETHIBOND NAB MO 7 #0 18IN (SUTURE) ×3 IMPLANT
SUT MNCRL AB 4-0 PS2 18 (SUTURE) ×3 IMPLANT
SUT NOVA NAB GS-22 2 2-0 T-19 (SUTURE) ×9 IMPLANT
SUT VIC AB 2-0 CT1 27 (SUTURE) ×6
SUT VIC AB 2-0 CT1 TAPERPNT 27 (SUTURE) ×4 IMPLANT
SUT VIC AB 3-0 SH 27 (SUTURE) ×6
SUT VIC AB 3-0 SH 27X BRD (SUTURE) ×4 IMPLANT

## 2021-09-13 NOTE — Anesthesia Procedure Notes (Signed)
Procedure Name: Intubation Date/Time: 09/13/2021 8:33 AM Performed by: Brynda Peon, CRNA Pre-anesthesia Checklist: Patient identified, Emergency Drugs available, Suction available, Patient being monitored and Timeout performed Patient Re-evaluated:Patient Re-evaluated prior to induction Oxygen Delivery Method: Circle system utilized Preoxygenation: Pre-oxygenation with 100% oxygen Induction Type: IV induction Laryngoscope Size: Miller and 3 Grade View: Grade II Tube type: Oral Tube size: 8.0 mm Number of attempts: 1 Airway Equipment and Method: Stylet Placement Confirmation: ETT inserted through vocal cords under direct vision, positive ETCO2, CO2 detector and breath sounds checked- equal and bilateral Secured at: 23 cm Tube secured with: Tape Dental Injury: Teeth and Oropharynx as per pre-operative assessment

## 2021-09-13 NOTE — Op Note (Signed)
Patient:  Jeffrey Shaw  DOB:  Sep 30, 1951  MRN:  [id number]   Preop Diagnosis: Left inguinal hernia, umbilical hernia  Postop Diagnosis: Same  Procedure: Left inguinal herniorrhaphy with mesh, umbilical herniorrhaphy with mesh  Surgeon: [name], MD  Anes: General endotracheal  Indications: Patient is a 70 year old white male who presents with both a left inguinal hernia and an umbilical hernia.  The risks and benefits of both procedures including bleeding, infection, mesh use, and the possibility of recurrence of the hernias were fully explained to the patient, who gave informed consent.  Procedure note: The patient was placed in supine position.  After induction of general endotracheal anesthesia, the abdomen was prepped and draped using the usual sterile technique with Betadine.  Surgical site confirmation was performed.  We first addressed the left inguinal hernia.  A left groin incision was made down to the external oblique aponeurosis without difficulty.  The aponeurosis was incised to the external ring.  A Penrose drain was placed from the spermatic cord.  The vas deferens was noted within the spermatic cord.  The ilioinguinal nerve was identified and care was taken to avoid it.  The patient had a large direct hernia.  The transversalis was incised circumferentially and the hernia was inverted.  A large Bard PerFix plug was then inserted and secured to the surrounding transversalis fascia using 2-0 Novafil interrupted sutures.  An onlay patch was placed along the floor of the inguinal canal and secured superiorly to the conjoined tendon and inferiorly to the shelving edge of Poupart's ligament using 2-0 Novafil interrupted sutures.  The external ring was recreated using a 2-0 Novafil interrupted suture.  Jeffrey Shaw was placed over the mesh into the subcutaneous tissue.  The external oblique aponeurosis was reapproximated using a 2-0 Vicryl running suture.  The subcutaneous layer was  reapproximated using a 3-0 Vicryl interrupted suture.  The skin was closed using a 4-0 Monocryl subcuticular suture.  Next, I proceeded with the umbilical herniorrhaphy.  An infraumbilical incision was made down to the fascia.  The umbilicus was freed away from the underlying fascia.  The hernia sac was excised down to the fascia.  Care was taken to avoid any bowel.  A 4.3 cm Bard Ventralax ST patch was then inserted and secured to the fascia using 0 Ethibond interrupted sutures.  The overlying fascia was reapproximated transversely using 0 Ethibond interrupted sutures.  The base of the umbilicus was secured back to the fascia using a 2-0 Vicryl interrupted suture.  Subcutaneous layer was reapproximated using 3-0 Vicryl interrupted suture.  The skin was closed using a 4-0 Monocryl subcuticular suture.  Dermabond was then applied to both incisions.  All tape and needle counts were correct at the end of the procedures.  The patient was extubated in the operating room and transferred to PACU in stable condition.  Complications: None  EBL: Minimal  Specimen: None

## 2021-09-13 NOTE — Anesthesia Preprocedure Evaluation (Signed)
Anesthesia Evaluation  Patient identified by MRN, date of birth, ID band Patient awake    Reviewed: Allergy & Precautions, H&P , NPO status , Patient's Chart, lab work & pertinent test results, reviewed documented beta blocker date and time   Airway Mallampati: II  TM Distance: >3 FB Neck ROM: full    Dental no notable dental hx.    Pulmonary neg pulmonary ROS, former smoker,    Pulmonary exam normal breath sounds clear to auscultation       Cardiovascular Exercise Tolerance: Good hypertension, + CAD   Rhythm:regular Rate:Normal     Neuro/Psych negative neurological ROS  negative psych ROS   GI/Hepatic Neg liver ROS, GERD  Medicated,  Endo/Other  diabetes, Type 2Hypothyroidism   Renal/GU negative Renal ROS  negative genitourinary   Musculoskeletal   Abdominal   Peds  Hematology negative hematology ROS (+)   Anesthesia Other Findings   Reproductive/Obstetrics negative OB ROS                             Anesthesia Physical Anesthesia Plan  ASA: 3  Anesthesia Plan: General   Post-op Pain Management:    Induction:   PONV Risk Score and Plan: Ondansetron  Airway Management Planned:   Additional Equipment:   Intra-op Plan:   Post-operative Plan:   Informed Consent: I have reviewed the patients History and Physical, chart, labs and discussed the procedure including the risks, benefits and alternatives for the proposed anesthesia with the patient or authorized representative who has indicated his/her understanding and acceptance.     Dental Advisory Given  Plan Discussed with: CRNA  Anesthesia Plan Comments:         Anesthesia Quick Evaluation

## 2021-09-13 NOTE — Transfer of Care (Signed)
Immediate Anesthesia Transfer of Care Note  Patient: Jeffrey Shaw  Procedure(s) Performed: HERNIA REPAIR UMBILICAL ADULT W/MESH (Abdomen) HERNIA REPAIR INGUINAL ADULT W/MESH (Left: Inguinal)  Patient Location: PACU  Anesthesia Type:General  Level of Consciousness: awake, alert , oriented and patient cooperative  Airway & Oxygen Therapy: Patient Spontanous Breathing  Post-op Assessment: Report given to RN, Post -op Vital signs reviewed and stable and Patient moving all extremities  Post vital signs: Reviewed and stable  Last Vitals:  Vitals Value Taken Time  BP 150/84 09/13/21 0950  Temp    Pulse 69 09/13/21 0950  Resp 17 09/13/21 0950  SpO2 100 % 09/13/21 0950    Last Pain:  Vitals:   09/13/21 0724  PainSc: 0-No pain         Complications: No notable events documented.

## 2021-09-13 NOTE — Interval H&P Note (Signed)
History and Physical Interval Note:  09/13/2021 8:04 AM  Jeffrey Shaw  has presented today for surgery, with the diagnosis of Umbilical hernia Left inguinal hernia.  The various methods of treatment have been discussed with the patient and family. After consideration of risks, benefits and other options for treatment, the patient has consented to  Procedure(s): HERNIA REPAIR UMBILICAL ADULT W/MESH (N/A) HERNIA REPAIR INGUINAL ADULT W/MESH (Left) as a surgical intervention.  The patient's history has been reviewed, patient examined, no change in status, stable for surgery.  I have reviewed the patient's chart and labs.  Questions were answered to the patient's satisfaction.     [name]

## 2021-09-13 NOTE — Discharge Instructions (Signed)
Leave Orange bracelet on until Tues. Do not use topical numbing agents like lidocaine patches, or numbing gels. If you have a dental emergency before Tues. Make Dentist aware of this medication

## 2021-09-13 NOTE — Anesthesia Postprocedure Evaluation (Signed)
Anesthesia Post Note  Patient: Jeffrey Shaw  Procedure(s) Performed: HERNIA REPAIR UMBILICAL ADULT W/MESH (Abdomen) HERNIA REPAIR INGUINAL ADULT W/MESH (Left: Inguinal)  Patient location during evaluation: Phase II Anesthesia Type: General Level of consciousness: awake Pain management: pain level controlled Vital Signs Assessment: post-procedure vital signs reviewed and stable Respiratory status: spontaneous breathing and respiratory function stable Cardiovascular status: blood pressure returned to baseline and stable Postop Assessment: no headache and no apparent nausea or vomiting Anesthetic complications: no Comments: Late entry   No notable events documented.   Last Vitals:  Vitals:   09/13/21 1015 09/13/21 1027  BP: (!) 149/84 (!) 151/80  Pulse: 61   Resp: 13 16  Temp:  37 C  SpO2: 98% 96%    Last Pain:  Vitals:   09/13/21 1027  TempSrc: Oral  PainSc: 3                  Windell Norfolk

## 2021-09-16 ENCOUNTER — Encounter (HOSPITAL_COMMUNITY): Payer: Self-pay | Admitting: General Surgery

## 2021-09-16 ENCOUNTER — Telehealth: Payer: Self-pay

## 2021-09-16 NOTE — Telephone Encounter (Signed)
-----   Message from Malen Gauze, MD sent at 09/16/2021 12:38 PM EDT ----- Negative. Followup 6 months with a PSA ----- Message ----- From: Ferdinand Lango, RN Sent: 09/16/2021  10:01 AM EDT To: Malen Gauze, MD  Please review

## 2021-09-16 NOTE — Telephone Encounter (Signed)
Patient notified. Appts scheduled with patient and mailed.

## 2021-09-17 ENCOUNTER — Other Ambulatory Visit: Payer: Self-pay | Admitting: Family Medicine

## 2021-09-17 DIAGNOSIS — Z794 Long term (current) use of insulin: Secondary | ICD-10-CM

## 2021-09-17 DIAGNOSIS — E1165 Type 2 diabetes mellitus with hyperglycemia: Secondary | ICD-10-CM

## 2021-09-18 ENCOUNTER — Ambulatory Visit: Payer: Medicare Other | Admitting: Urology

## 2021-09-19 ENCOUNTER — Encounter: Payer: Self-pay | Admitting: Family Medicine

## 2021-09-19 ENCOUNTER — Telehealth (INDEPENDENT_AMBULATORY_CARE_PROVIDER_SITE_OTHER): Payer: Medicare Other | Admitting: General Surgery

## 2021-09-19 DIAGNOSIS — Z09 Encounter for follow-up examination after completed treatment for conditions other than malignant neoplasm: Secondary | ICD-10-CM

## 2021-09-19 NOTE — Telephone Encounter (Signed)
Virtual telephone visit performed with patient.  He states he is doing very well.  He has no incisional pain.  Bruising is resolving.  I told him he could increase activity as able.  He would like to go back to work, but he should gradually increase his weight load.  As this was a part of the global surgical fee, this was not a billable visit.  Total telephone time was 2 minutes.

## 2021-10-12 ENCOUNTER — Encounter: Payer: Self-pay | Admitting: Family Medicine

## 2021-11-11 ENCOUNTER — Other Ambulatory Visit: Payer: Self-pay

## 2021-11-11 ENCOUNTER — Encounter (HOSPITAL_COMMUNITY): Payer: Self-pay | Admitting: *Deleted

## 2021-11-11 ENCOUNTER — Emergency Department (HOSPITAL_COMMUNITY)
Admission: EM | Admit: 2021-11-11 | Discharge: 2021-11-11 | Disposition: A | Discharge status: Home / Self Care | Payer: Medicare Other | Attending: Emergency Medicine | Admitting: Emergency Medicine

## 2021-11-11 ENCOUNTER — Emergency Department (HOSPITAL_COMMUNITY): Payer: Medicare Other

## 2021-11-11 DIAGNOSIS — I10 Essential (primary) hypertension: Secondary | ICD-10-CM | POA: Diagnosis not present

## 2021-11-11 DIAGNOSIS — R059 Cough, unspecified: Secondary | ICD-10-CM | POA: Diagnosis not present

## 2021-11-11 DIAGNOSIS — I2510 Atherosclerotic heart disease of native coronary artery without angina pectoris: Secondary | ICD-10-CM | POA: Diagnosis not present

## 2021-11-11 DIAGNOSIS — Z794 Long term (current) use of insulin: Secondary | ICD-10-CM | POA: Insufficient documentation

## 2021-11-11 DIAGNOSIS — Z79899 Other long term (current) drug therapy: Secondary | ICD-10-CM | POA: Diagnosis not present

## 2021-11-11 DIAGNOSIS — E119 Type 2 diabetes mellitus without complications: Secondary | ICD-10-CM | POA: Diagnosis not present

## 2021-11-11 DIAGNOSIS — J189 Pneumonia, unspecified organism: Secondary | ICD-10-CM | POA: Diagnosis not present

## 2021-11-11 DIAGNOSIS — Z20822 Contact with and (suspected) exposure to covid-19: Secondary | ICD-10-CM | POA: Insufficient documentation

## 2021-11-11 DIAGNOSIS — Z951 Presence of aortocoronary bypass graft: Secondary | ICD-10-CM | POA: Insufficient documentation

## 2021-11-11 DIAGNOSIS — Z7984 Long term (current) use of oral hypoglycemic drugs: Secondary | ICD-10-CM | POA: Diagnosis not present

## 2021-11-11 DIAGNOSIS — E039 Hypothyroidism, unspecified: Secondary | ICD-10-CM | POA: Insufficient documentation

## 2021-11-11 DIAGNOSIS — R599 Enlarged lymph nodes, unspecified: Secondary | ICD-10-CM | POA: Insufficient documentation

## 2021-11-11 DIAGNOSIS — Z96642 Presence of left artificial hip joint: Secondary | ICD-10-CM | POA: Diagnosis not present

## 2021-11-11 DIAGNOSIS — Z87891 Personal history of nicotine dependence: Secondary | ICD-10-CM | POA: Diagnosis not present

## 2021-11-11 LAB — CBC WITH DIFFERENTIAL/PLATELET
Abs Immature Granulocytes: 0.08 10*3/uL — ABNORMAL HIGH (ref 0.00–0.07)
Basophils Absolute: 0.1 10*3/uL (ref 0.0–0.1)
Basophils Relative: 1 %
Eosinophils Absolute: 0.1 10*3/uL (ref 0.0–0.5)
Eosinophils Relative: 1 %
HCT: 41.9 % (ref 39.0–52.0)
Hemoglobin: 13.9 g/dL (ref 13.0–17.0)
Immature Granulocytes: 1 %
Lymphocytes Relative: 15 %
Lymphs Abs: 1.6 10*3/uL (ref 0.7–4.0)
MCH: 31.1 pg (ref 26.0–34.0)
MCHC: 33.2 g/dL (ref 30.0–36.0)
MCV: 93.7 fL (ref 80.0–100.0)
Monocytes Absolute: 1.1 10*3/uL — ABNORMAL HIGH (ref 0.1–1.0)
Monocytes Relative: 11 %
Neutro Abs: 7.4 10*3/uL (ref 1.7–7.7)
Neutrophils Relative %: 71 %
Platelets: 225 10*3/uL (ref 150–400)
RBC: 4.47 MIL/uL (ref 4.22–5.81)
RDW: 11.9 % (ref 11.5–15.5)
WBC: 10.3 10*3/uL (ref 4.0–10.5)
nRBC: 0 % (ref 0.0–0.2)

## 2021-11-11 LAB — COMPREHENSIVE METABOLIC PANEL
ALT: 20 U/L (ref 0–44)
AST: 19 U/L (ref 15–41)
Albumin: 3.6 g/dL (ref 3.5–5.0)
Alkaline Phosphatase: 97 U/L (ref 38–126)
Anion gap: 7 (ref 5–15)
BUN: 18 mg/dL (ref 8–23)
CO2: 26 mmol/L (ref 22–32)
Calcium: 9.4 mg/dL (ref 8.9–10.3)
Chloride: 102 mmol/L (ref 98–111)
Creatinine, Ser: 0.99 mg/dL (ref 0.61–1.24)
GFR, Estimated: >60 mL/min (ref >60)
Glucose, Bld: 140 mg/dL — ABNORMAL HIGH (ref 70–99)
Potassium: 5.2 mmol/L — ABNORMAL HIGH (ref 3.5–5.1)
Sodium: 135 mmol/L (ref 135–145)
Total Bilirubin: 1.6 mg/dL — ABNORMAL HIGH (ref 0.3–1.2)
Total Protein: 7 g/dL (ref 6.5–8.1)

## 2021-11-11 LAB — RESP PANEL BY RT-PCR (FLU A&B, COVID) ARPGX2
Influenza A by PCR: NEGATIVE
Influenza B by PCR: NEGATIVE
SARS Coronavirus 2 by RT PCR: NEGATIVE

## 2021-11-11 MED ORDER — DOXYCYCLINE HYCLATE 100 MG PO TABS
100.0000 mg | ORAL_TABLET | Freq: Once | ORAL | Status: AC
  Administered 2021-11-11: 100 mg via ORAL
  Filled 2021-11-11: qty 1

## 2021-11-11 MED ORDER — DOXYCYCLINE HYCLATE 100 MG PO CAPS: 100.0000 mg | ORAL_CAPSULE | Freq: Two times a day (BID) | ORAL | 0 refills | Status: DC

## 2021-11-11 MED ORDER — CEPHALEXIN 500 MG PO CAPS
500.0000 mg | ORAL_CAPSULE | Freq: Once | ORAL | Status: AC
  Administered 2021-11-11: 500 mg via ORAL
  Filled 2021-11-11: qty 1

## 2021-11-11 MED ORDER — GUAIFENESIN 100 MG/5ML PO LIQD: 100.0000 mg | ORAL | 0 refills | Status: DC | PRN

## 2021-11-11 MED ORDER — CEPHALEXIN 500 MG PO CAPS: 500.0000 mg | ORAL_CAPSULE | Freq: Four times a day (QID) | ORAL | 0 refills | Status: DC

## 2021-11-11 MED ORDER — ALBUTEROL SULFATE HFA 108 (90 BASE) MCG/ACT IN AERS
4.0000 | INHALATION_SPRAY | Freq: Once | RESPIRATORY_TRACT | Status: AC
  Administered 2021-11-11: 4 via RESPIRATORY_TRACT
  Filled 2021-11-11: qty 6.7

## 2021-11-11 NOTE — ED Triage Notes (Signed)
Pt c/o cough, sore throat, congestion since last Wednesday. Denies fever.

## 2021-11-11 NOTE — Discharge Instructions (Signed)
Although your chest x-ray is normal, we suspect that you most likely have a community-acquired pneumonia.  Please start taking the antibiotics that are prescribed Take Mucinex only if your sputum is extremely thick and needs to be broken down.

## 2021-11-11 NOTE — ED Provider Notes (Signed)
[facility] EMERGENCY DEPARTMENT Provider Note   CSN: [id number] Arrival date & time: 11/11/21  0177     History Chief Complaint  Patient presents with   Cough    Jeffrey Shaw is a 70 y.o. male.  HPI     70 year old male with history of CAD, diabetes but no primary lung disease comes in with chief complaint of cough.  Patient has been sick for the last week.  He has cough that is producing green phlegm along with some shortness of breath, congestion, sore throat, significant malaise.  No body aches, no fevers.  Wife is sick as well, was started on antibiotics, she has been sick or for longer duration.  Past Medical History:  Diagnosis Date   Arthritis    CAD (coronary artery disease)    CABG 01/2008 /  Catheterization, November, 2012, LIMA to the LAD patent,  70% distal left main stenosis involving the proximal circumflex, DES to the left main continuing into the circumflex   CORONARY ARTERY BYPASS GRAFT, HX OF 01/17/2010   Qualifier: Diagnosis of  By: Myrtis Ser, MD, [facility], [place]mie Evens    Diabetes mellitus    Dr. Nonie Hoyer, [facility]   GERD (gastroesophageal reflux disease)    HTN (hypertension)    Hx of CABG    2009, LIMA to LAD, right radial to PDA, SVG to diagonal, SVG to OM1   Hyperlipemia    Hypothyroid    Primary osteoarthritis of left hip 04/18/2016    Patient Active Problem List   Diagnosis Date Noted   Left inguinal hernia    Umbilical hernia without obstruction and without gangrene    Aortic atherosclerosis (HCC) 07/19/2021   Controlled type 2 diabetes mellitus with hyperglycemia, with long-term current use of insulin (HCC) 01/08/2021   Gastroesophageal reflux disease 01/08/2021   Onychomycosis 07/05/2020   Erectile dysfunction due to arterial insufficiency 07/07/2019   Coronary artery disease involving native coronary artery of native heart without angina pectoris    Hypothyroidism 08/02/2009   Mixed hyperlipidemia 08/02/2009   Essential hypertension 08/02/2009     Past Surgical History:  Procedure Laterality Date   BACK SURGERY     CARPAL TUNNEL RELEASE     left hand   CORONARY ANGIOPLASTY     CORONARY ARTERY BYPASS GRAFT  01/2008   X 6   INGUINAL HERNIA REPAIR     right   INGUINAL HERNIA REPAIR Left 09/13/2021   Procedure: HERNIA REPAIR INGUINAL ADULT W/MESH;  Surgeon: [name], MD;  Location: [facility] ORS;  Service: General;  Laterality: Left;   LEFT HEART CATHETERIZATION WITH CORONARY/GRAFT ANGIOGRAM N/A 10/30/2011   Procedure: LEFT HEART CATHETERIZATION WITH Isabel Caprice;  Surgeon: Herby Abraham, MD;  Location: [facility] CATH LAB;  Service: Cardiovascular;  Laterality: N/A;   LUMBAR DISC SURGERY     removed   PERCUTANEOUS CORONARY STENT INTERVENTION (PCI-S) N/A 11/03/2011   Procedure: PERCUTANEOUS CORONARY STENT INTERVENTION (PCI-S);  Surgeon: Kathleene Hazel, MD;  Location: [facility] CATH LAB;  Service: Cardiovascular;  Laterality: N/A;   TONSILLECTOMY  ~ 1955   TOTAL HIP ARTHROPLASTY Left 04/18/2016   Procedure: TOTAL HIP ARTHROPLASTY;  Surgeon: Frederico Hamman, MD;  Location: [facility] OR;  Service: Orthopedics;  Laterality: Left;   UMBILICAL HERNIA REPAIR N/A 09/13/2021   Procedure: HERNIA REPAIR UMBILICAL ADULT W/MESH;  Surgeon: [name], MD;  Location: [facility] ORS;  Service: General;  Laterality: N/A;   URETEROLITHOTOMY Left    with stone basket removal  Family History  Problem Relation Age of Onset   Dementia Mother    Liver disease Mother    Suicidality Father    Emphysema Father    Cancer Father    Drug abuse Brother        Clean now   Rheum arthritis Sister    Heart attack Maternal Grandmother    Emphysema Maternal Grandfather    Stroke Maternal Grandfather    Stomach cancer Paternal Grandmother    Diabetes Paternal Aunt    Diabetes Paternal Aunt     Social History   Tobacco Use   Smoking status: Former    Packs/day: 0.80    Years: 6.00    Pack years: 4.80    Types: Cigarettes    Quit date: 12/22/1968     Years since quitting: 52.9   Smokeless tobacco: Never  Vaping Use   Vaping Use: Never used  Substance Use Topics   Alcohol use: Yes    Alcohol/week: 2.0 standard drinks    Types: 2 Cans of beer per week    Comment: maybe one beer a month   Drug use: No    Home Medications Prior to Admission medications   Medication Sig Start Date End Date Taking? Authorizing Provider  cephALEXin (KEFLEX) 500 MG capsule Take 1 capsule (500 mg total) by mouth 4 (four) times daily. 11/11/21  Yes Derwood Kaplan, MD  doxycycline (VIBRAMYCIN) 100 MG capsule Take 1 capsule (100 mg total) by mouth 2 (two) times daily. 11/11/21  Yes Derwood Kaplan, MD  guaiFENesin (ROBITUSSIN) 100 MG/5ML liquid Take 5-10 mLs (100-200 mg total) by mouth every 4 (four) hours as needed for cough or to loosen phlegm. 11/11/21  Yes Derwood Kaplan, MD  Ascorbic Acid (VITAMIN C WITH ROSE HIPS) 500 MG tablet Take 500 mg by mouth daily.    [provider]  atorvastatin (LIPITOR) 40 MG tablet Take 1 tablet (40 mg total) by mouth daily. 08/30/21   Gwenlyn Fudge, FNP  Continuous Blood Gluc Receiver (DEXCOM G6 RECEIVER) DEVI 1 Device by Does not apply route daily. 01/04/21   Bennie Pierini, FNP  Continuous Blood Gluc Sensor (DEXCOM G6 SENSOR) MISC USE ONE SENSOR EVERY 10 DAYS Dx E11.65 09/17/21   Gwenlyn Fudge, FNP  Continuous Blood Gluc Transmit (DEXCOM G6 TRANSMITTER) MISC USE EVERY 3 MONTHS 08/14/21   Gwenlyn Fudge, FNP  famotidine (PEPCID) 20 MG tablet Take 20 mg by mouth 2 (two) times daily.    [provider]  insulin aspart (NOVOLOG) 100 UNIT/ML FlexPen Inject 8-24 Units into the skin 3 (three) times daily before meals.    [provider]  Insulin Detemir (LEVEMIR FLEXTOUCH) 100 UNIT/ML Pen Inject 10-25 Units into the skin See admin instructions. 10 units qam, 25 units qhs 08/17/18   [provider]  levothyroxine (SYNTHROID) 175 MCG tablet Take 1 tablet (175 mcg total) by mouth daily  before breakfast. 07/22/21   Gwenlyn Fudge, FNP  linaclotide [facility]) 72 MCG capsule Take 1 capsule (72 mcg total) by mouth daily before breakfast. Patient not taking: No sig reported 05/09/21   Gwenlyn Fudge, FNP  lisinopril (ZESTRIL) 10 MG tablet TAKE 1 TABLET BY MOUTH DAILY. [facility] FOR ZESTRIL) 07/22/21   Gwenlyn Fudge, FNP  metFORMIN (GLUCOPHAGE-XR) 500 MG 24 hr tablet Take 1 tablet by mouth once daily with breakfast 08/16/21   Gwenlyn Fudge, FNP  OVER THE COUNTER MEDICATION Take 1 capsule by mouth 2 (two) times daily. 3-6-9 fish oil,  flax seed combo    [provider]  OZEMPIC, 1 MG/DOSE, 4 MG/3ML SOPN Inject 1 mg as directed every Sunday. 10/15/20   [provider]  polyethylene glycol powder (GLYCOLAX/MIRALAX) 17 GM/SCOOP powder Take 17 g by mouth 2 (two) times daily as needed. Patient taking differently: Take 17 g by mouth daily. 05/27/21   Dettinger, Elige Radon, MD  sildenafil (VIAGRA) 100 MG tablet TAKE 1 TABLET BY MOUTH ONCE DAILY AS NEEDED FOR  ERECTILE  DYSFUNCTION 08/08/21   Gwenlyn Fudge, FNP  sodium phosphate (FLEET) 7-19 GM/118ML ENEM Place 133 mLs (1 enema total) rectally daily as needed for moderate constipation or severe constipation. 05/27/21   Dettinger, Elige Radon, MD  traMADol (ULTRAM) 50 MG tablet Take 1 tablet (50 mg total) by mouth every 6 (six) hours as needed for severe pain. 09/13/21   [name], MD  zinc gluconate 50 MG tablet Take 50 mg by mouth daily.    [provider]    Allergies    Morphine and related  Review of Systems   Review of Systems  Constitutional:  Positive for activity change.  HENT:  Positive for congestion.   Respiratory:  Positive for cough and shortness of breath.   Gastrointestinal:  Negative for nausea and vomiting.  Allergic/Immunologic: Negative for immunocompromised state.  Hematological:  Does not bruise/bleed easily.  All other systems reviewed and are negative.  Physical Exam Updated Vital  Signs BP 140/79 (BP Location: Left Arm)   Pulse 85   Temp 98.3 F (36.8 C) (Oral)   Resp 18   Ht 6\' 4"  (1.93 m)   Wt 102.1 kg   SpO2 98%   BMI 27.39 kg/m   Physical Exam Vitals and nursing note reviewed.  Constitutional:      Appearance: He is well-developed.  HENT:     Head: Atraumatic.     Nose: Congestion present.     Mouth/Throat:     Pharynx: No oropharyngeal exudate.  Cardiovascular:     Rate and Rhythm: Normal rate.  Pulmonary:     Effort: Pulmonary effort is normal.     Breath sounds: Wheezing present. No rhonchi.     Comments: Right lower lung field wheeze Musculoskeletal:     Cervical back: Neck supple.  Lymphadenopathy:     Cervical: Cervical adenopathy present.  Skin:    General: Skin is warm.  Neurological:     Mental Status: He is alert and oriented to person, place, and time.    ED Results / Procedures / Treatments   Labs (all labs ordered are listed, but only abnormal results are displayed) Labs Reviewed  COMPREHENSIVE METABOLIC PANEL - Abnormal; Notable for the following components:      Result Value   Potassium 5.2 (*)    Glucose, Bld 140 (*)    Total Bilirubin 1.6 (*)    All other components within normal limits  CBC WITH DIFFERENTIAL/PLATELET - Abnormal; Notable for the following components:   Monocytes Absolute 1.1 (*)    Abs Immature Granulocytes 0.08 (*)    All other components within normal limits  RESP PANEL BY RT-PCR (FLU A&B, COVID) [id number]    EKG None  Radiology DG Chest Port 1 View  Result Date: 11/11/2021 CLINICAL DATA:  Cough. EXAM: PORTABLE CHEST 1 VIEW COMPARISON:  April 07, 2016. FINDINGS: The heart size and mediastinal contours are within normal limits. Both lungs are clear. The visualized skeletal structures are unremarkable. IMPRESSION: No active disease. Electronically Signed  By: Lupita Raider M.D.   On: 11/11/2021 08:44    Procedures Procedures   Medications Ordered in ED Medications  cephALEXin (KEFLEX)  capsule 500 mg (has no administration in time range)  doxycycline (VIBRA-TABS) tablet 100 mg (has no administration in time range)  albuterol (VENTOLIN HFA) 108 (90 Base) MCG/ACT inhaler 4 puff (4 puffs Inhalation Given 11/11/21 0857)    ED Course  I have reviewed the triage vital signs and the nursing notes.  Pertinent labs & imaging results that were available during my care of the patient were reviewed by me and considered in my medical decision making (see chart for details).    MDM Rules/Calculators/A&P                           70 year old comes in with URI-like complaints. His wife is also sick with similar symptoms.  On exam, patient did have some mild right-sided wheeze that cleared after cough.  His cough is producing thick sputum.  He has some chest discomfort with cough and deep inspiration.  Concerns are for bronchitis, pneumonia.  Viral URI higher suspicion given the constellation of symptoms and uptake in URI activity at this time in the community.  Patient's chest x-ray is clear, lab is reassuring.  His COVID-19, flu test is also negative.  Patient's wife is also under my care, she actually has a focal infiltrate -and my suspicion is that patient also clinically meets criteria for pneumonia.  We will also treat him for walking pneumonia in the setting of his COVID and flu being negative.   The patient appears reasonably screened and/or stabilized for discharge and I doubt any other medical condition or other [facility] requiring further screening, evaluation, or treatment in the ED at this time prior to discharge.   Results from the ER workup discussed with the patient face to face and all questions answered to the best of my ability. The patient is safe for discharge with strict return precautions.  Strict ER return precautions have been discussed, and patient is agreeing with the plan and is comfortable with the workup done and the recommendations from the ER.    Final  Clinical Impression(s) / ED Diagnoses Final diagnoses:  Community acquired pneumonia, unspecified laterality    Rx / DC Orders ED Discharge Orders          Ordered    cephALEXin (KEFLEX) 500 MG capsule  4 times daily        11/11/21 1032    doxycycline (VIBRAMYCIN) 100 MG capsule  2 times daily        11/11/21 1032    guaiFENesin (ROBITUSSIN) 100 MG/5ML liquid  Every 4 hours PRN        11/11/21 1032             Derwood Kaplan, MD 11/11/21 1037

## 2021-11-11 NOTE — ED Notes (Signed)
Dexcom was taken off by CT and placed in a bag in room.

## 2021-11-29 ENCOUNTER — Other Ambulatory Visit: Payer: Self-pay | Admitting: Family Medicine

## 2021-11-29 DIAGNOSIS — E1165 Type 2 diabetes mellitus with hyperglycemia: Secondary | ICD-10-CM

## 2021-12-09 ENCOUNTER — Other Ambulatory Visit: Payer: Self-pay | Admitting: Family Medicine

## 2021-12-09 DIAGNOSIS — Z794 Long term (current) use of insulin: Secondary | ICD-10-CM

## 2021-12-10 ENCOUNTER — Ambulatory Visit (INDEPENDENT_AMBULATORY_CARE_PROVIDER_SITE_OTHER): Payer: Medicare Other | Admitting: Family Medicine

## 2021-12-10 ENCOUNTER — Encounter: Payer: Self-pay | Admitting: Family Medicine

## 2021-12-10 VITALS — BP 133/65 | HR 70 | Temp 97.4°F | Ht 76.0 in | Wt 225.2 lb

## 2021-12-10 DIAGNOSIS — R051 Acute cough: Secondary | ICD-10-CM

## 2021-12-10 DIAGNOSIS — J069 Acute upper respiratory infection, unspecified: Secondary | ICD-10-CM

## 2021-12-10 MED ORDER — BENZONATATE 200 MG PO CAPS: 200.0000 mg | ORAL_CAPSULE | Freq: Three times a day (TID) | ORAL | 1 refills | Status: DC | PRN

## 2021-12-10 NOTE — Progress Notes (Signed)
Assessment & Plan:  1. Viral URI Symptom management. Reassurance provided that lungs sound clear. Advised to let me know if he is not improving at 7-10 days after onset of symptoms and I will prescribe an antibiotic.  2. Acute cough - benzonatate (TESSALON) 200 MG capsule; Take 1 capsule (200 mg total) by mouth 3 (three) times daily as needed for cough.  Dispense: 30 capsule; Refill: 1   Follow up plan: Return if symptoms worsen or fail to improve.  [name], MSN, APRN, FNP-C [facility]  Subjective:   Patient ID: Jeffrey Shaw, male    DOB: Jan 21, 1951, 70 y.o.   MRN: [id number]  HPI: Jeffrey Shaw is a 70 y.o. male presenting on 12/10/2021 for Cough and Nasal Congestion (X 5 days )  Patient complains of cough, head congestion, headache, runny nose, sore throat, and shortness of breath. Onset of symptoms was 5 days ago, unchanged since that time. He is drinking plenty of fluids. Evaluation to date: at home COVID test negative.  He has a history of recent pneumonia. He does not smoke. Patient has not been fully vaccinated against COVID-19. Patient wants to make sure he is not developing pneumonia again.   ROS: Negative unless specifically indicated above in HPI.   Relevant past medical history reviewed and updated as indicated.   Allergies and medications reviewed and updated.   Current Outpatient Medications:    Ascorbic Acid (VITAMIN C WITH ROSE HIPS) 500 MG tablet, Take 500 mg by mouth daily., Disp: , Rfl:    atorvastatin (LIPITOR) 40 MG tablet, Take 1 tablet (40 mg total) by mouth daily., Disp: 90 tablet, Rfl: 1   Continuous Blood Gluc Receiver (DEXCOM G6 RECEIVER) DEVI, 1 Device by Does not apply route daily., Disp: 1 each, Rfl: 0   Continuous Blood Gluc Sensor (DEXCOM G6 SENSOR) MISC, USE ONE SENSOR EVERY 10 DAYS Dx E11.65, Disp: 3 each, Rfl: 2   Continuous Blood Gluc Transmit (DEXCOM G6 TRANSMITTER) MISC, USE EVERY 3 MONTHS, Disp: 2 each, Rfl:  1   famotidine (PEPCID) 20 MG tablet, Take 20 mg by mouth 2 (two) times daily., Disp: , Rfl:    insulin aspart (NOVOLOG) 100 UNIT/ML FlexPen, Inject 8-24 Units into the skin 3 (three) times daily before meals., Disp: , Rfl:    Insulin Detemir (LEVEMIR FLEXTOUCH) 100 UNIT/ML Pen, Inject 10-25 Units into the skin See admin instructions. 10 units qam, 25 units qhs, Disp: , Rfl:    levothyroxine (SYNTHROID) 175 MCG tablet, Take 1 tablet (175 mcg total) by mouth daily before breakfast., Disp: 90 tablet, Rfl: 2   linaclotide (LINZESS) 72 MCG capsule, Take 1 capsule (72 mcg total) by mouth daily before breakfast., Disp: 30 capsule, Rfl: 2   lisinopril (ZESTRIL) 10 MG tablet, TAKE 1 TABLET BY MOUTH DAILY. (GENERIC FOR ZESTRIL), Disp: 90 tablet, Rfl: 1   metFORMIN (GLUCOPHAGE-XR) 500 MG 24 hr tablet, Take 1 tablet by mouth once daily with breakfast, Disp: 90 tablet, Rfl: 0   OVER THE COUNTER MEDICATION, Take 1 capsule by mouth 2 (two) times daily. 3-6-9 fish oil, flax seed combo, Disp: , Rfl:    OZEMPIC, 1 MG/DOSE, 4 MG/3ML SOPN, Inject 1 mg as directed every Sunday., Disp: , Rfl:    polyethylene glycol powder (GLYCOLAX/MIRALAX) 17 GM/SCOOP powder, Take 17 g by mouth 2 (two) times daily as needed. (Patient taking differently: Take 17 g by mouth daily.), Disp: 3350 g, Rfl: 1   sildenafil (VIAGRA) 100 MG tablet,  TAKE 1 TABLET BY MOUTH ONCE DAILY AS NEEDED FOR  ERECTILE  DYSFUNCTION, Disp: 30 tablet, Rfl: 2   sodium phosphate (FLEET) 7-19 GM/118ML ENEM, Place 133 mLs (1 enema total) rectally daily as needed for moderate constipation or severe constipation., Disp: 399 mL, Rfl: 1   traMADol (ULTRAM) 50 MG tablet, Take 1 tablet (50 mg total) by mouth every 6 (six) hours as needed for severe pain., Disp: 30 tablet, Rfl: 0   zinc gluconate 50 MG tablet, Take 50 mg by mouth daily., Disp: , Rfl:   Allergies  Allergen Reactions   Morphine And Related Nausea Only    Objective:   BP 133/65    Pulse 70    Temp (!)  97.4 F (36.3 C) (Temporal)    Ht 6\' 4"  (1.93 m)    Wt 225 lb 3.2 oz (102.2 kg)    SpO2 95%    BMI 27.41 kg/m    Physical Exam Vitals reviewed.  Constitutional:      General: He is not in acute distress.    Appearance: Normal appearance. He is not ill-appearing, toxic-appearing or diaphoretic.  HENT:     Head: Normocephalic and atraumatic.     Right Ear: Tympanic membrane, ear canal and external ear normal. There is no impacted cerumen.     Left Ear: Tympanic membrane, ear canal and external ear normal. There is no impacted cerumen.     Nose:     Right Sinus: No maxillary sinus tenderness or frontal sinus tenderness.     Left Sinus: No maxillary sinus tenderness or frontal sinus tenderness.     Mouth/Throat:     Mouth: Mucous membranes are moist.     Pharynx: Oropharynx is clear. Posterior oropharyngeal erythema present. No oropharyngeal exudate.  Eyes:     General: No scleral icterus.       Right eye: No discharge.        Left eye: No discharge.     Conjunctiva/sclera: Conjunctivae normal.  Cardiovascular:     Rate and Rhythm: Normal rate and regular rhythm.     Heart sounds: Normal heart sounds. No murmur heard.   No friction rub. No gallop.  Pulmonary:     Effort: Pulmonary effort is normal. No respiratory distress.     Breath sounds: Normal breath sounds. No stridor. No wheezing, rhonchi or rales.  Musculoskeletal:        General: Normal range of motion.     Cervical back: Normal range of motion.  Lymphadenopathy:     Cervical: No cervical adenopathy.  Skin:    General: Skin is warm and dry.  Neurological:     Mental Status: He is alert and oriented to person, place, and time. Mental status is at baseline.  Psychiatric:        Mood and Affect: Mood normal.        Behavior: Behavior normal.        Thought Content: Thought content normal.        Judgment: Judgment normal.

## 2021-12-16 ENCOUNTER — Encounter: Payer: Self-pay | Admitting: Family Medicine

## 2021-12-19 ENCOUNTER — Other Ambulatory Visit: Payer: Self-pay | Admitting: *Deleted

## 2021-12-19 DIAGNOSIS — I700 Atherosclerosis of aorta: Secondary | ICD-10-CM

## 2021-12-19 DIAGNOSIS — E782 Mixed hyperlipidemia: Secondary | ICD-10-CM

## 2021-12-19 DIAGNOSIS — E039 Hypothyroidism, unspecified: Secondary | ICD-10-CM

## 2021-12-19 DIAGNOSIS — E1165 Type 2 diabetes mellitus with hyperglycemia: Secondary | ICD-10-CM

## 2021-12-19 DIAGNOSIS — I2510 Atherosclerotic heart disease of native coronary artery without angina pectoris: Secondary | ICD-10-CM

## 2021-12-23 MED ORDER — LEVOTHYROXINE SODIUM 175 MCG PO TABS: 175.0000 ug | ORAL_TABLET | Freq: Every day | ORAL | 2 refills | Status: DC

## 2021-12-23 MED ORDER — ATORVASTATIN CALCIUM 40 MG PO TABS: 40.0000 mg | ORAL_TABLET | Freq: Every day | ORAL | 2 refills | Status: DC

## 2021-12-23 MED ORDER — LISINOPRIL 10 MG PO TABS: ORAL_TABLET | ORAL | 2 refills | Status: DC

## 2021-12-25 ENCOUNTER — Other Ambulatory Visit: Payer: Self-pay | Admitting: Family Medicine

## 2021-12-25 DIAGNOSIS — Z794 Long term (current) use of insulin: Secondary | ICD-10-CM

## 2021-12-25 DIAGNOSIS — E1165 Type 2 diabetes mellitus with hyperglycemia: Secondary | ICD-10-CM

## 2022-01-11 DIAGNOSIS — E119 Type 2 diabetes mellitus without complications: Secondary | ICD-10-CM | POA: Diagnosis not present

## 2022-01-11 DIAGNOSIS — H524 Presbyopia: Secondary | ICD-10-CM | POA: Diagnosis not present

## 2022-01-11 DIAGNOSIS — H40033 Anatomical narrow angle, bilateral: Secondary | ICD-10-CM | POA: Diagnosis not present

## 2022-01-24 ENCOUNTER — Encounter: Payer: Self-pay | Admitting: Family Medicine

## 2022-01-24 ENCOUNTER — Ambulatory Visit (INDEPENDENT_AMBULATORY_CARE_PROVIDER_SITE_OTHER): Payer: Medicare Other | Admitting: Family Medicine

## 2022-01-24 ENCOUNTER — Ambulatory Visit (INDEPENDENT_AMBULATORY_CARE_PROVIDER_SITE_OTHER): Payer: Medicare Other

## 2022-01-24 VITALS — BP 149/69 | HR 64 | Temp 97.2°F | Ht 76.0 in | Wt 230.6 lb

## 2022-01-24 DIAGNOSIS — K219 Gastro-esophageal reflux disease without esophagitis: Secondary | ICD-10-CM

## 2022-01-24 DIAGNOSIS — I10 Essential (primary) hypertension: Secondary | ICD-10-CM

## 2022-01-24 DIAGNOSIS — G8929 Other chronic pain: Secondary | ICD-10-CM

## 2022-01-24 DIAGNOSIS — I2510 Atherosclerotic heart disease of native coronary artery without angina pectoris: Secondary | ICD-10-CM

## 2022-01-24 DIAGNOSIS — E039 Hypothyroidism, unspecified: Secondary | ICD-10-CM

## 2022-01-24 DIAGNOSIS — K5900 Constipation, unspecified: Secondary | ICD-10-CM

## 2022-01-24 DIAGNOSIS — Z794 Long term (current) use of insulin: Secondary | ICD-10-CM

## 2022-01-24 DIAGNOSIS — E782 Mixed hyperlipidemia: Secondary | ICD-10-CM

## 2022-01-24 DIAGNOSIS — I700 Atherosclerosis of aorta: Secondary | ICD-10-CM

## 2022-01-24 DIAGNOSIS — M25562 Pain in left knee: Secondary | ICD-10-CM | POA: Diagnosis not present

## 2022-01-24 DIAGNOSIS — E1165 Type 2 diabetes mellitus with hyperglycemia: Secondary | ICD-10-CM | POA: Diagnosis not present

## 2022-01-24 DIAGNOSIS — M25561 Pain in right knee: Secondary | ICD-10-CM | POA: Diagnosis not present

## 2022-01-24 LAB — BAYER DCA HB A1C WAIVED: HB A1C (BAYER DCA - WAIVED): 6.1 % — ABNORMAL HIGH (ref 4.8–5.6)

## 2022-01-24 LAB — LIPID PANEL

## 2022-01-24 MED ORDER — LINACLOTIDE 72 MCG PO CAPS: 72.0000 ug | ORAL_CAPSULE | Freq: Every day | ORAL | 1 refills | Status: DC

## 2022-01-24 MED ORDER — METFORMIN HCL ER 500 MG PO TB24: 500.0000 mg | ORAL_TABLET | Freq: Every day | ORAL | 1 refills | Status: DC

## 2022-01-24 NOTE — Progress Notes (Signed)
Assessment & Plan:  1. Controlled type 2 diabetes mellitus with hyperglycemia, with long-term current use of insulin (HCC) Lab Results  Component Value Date   HGBA1C 6.1 (H) 01/24/2022   HGBA1C 5.8 (H) 09/10/2021   HGBA1C 6.3 07/19/2021  - Managed by endocrinology. - Diabetes is at goal of A1c < 7. - Medications: continue current medications - Home glucose monitoring: continue DEXCOM - Patient is currently taking a statin. Patient is taking an ACE-inhibitor/ARB.   Diabetes Health Maintenance Due  Topic Date Due   OPHTHALMOLOGY EXAM  03/19/2022   FOOT EXAM  07/19/2022   HEMOGLOBIN A1C  07/24/2022    Lab Results  Component Value Date   LABMICR Comment 08/14/2021   LABMICR 23.6 07/06/2019   - Lipid panel - CBC with Differential/Platelet - CMP14+EGFR - Bayer DCA Hb A1c Waived - metFORMIN (GLUCOPHAGE-XR) 500 MG 24 hr tablet; Take 1 tablet (500 mg total) by mouth daily with breakfast.  Dispense: 90 tablet; Refill: 1  2. Acquired hypothyroidism Well controlled on current regimen.  - TSH - T4, Free  3. Essential hypertension Well controlled on current regimen.  - Lipid panel - CBC with Differential/Platelet - CMP14+EGFR  4. Mixed hyperlipidemia Well controlled on current regimen.  - Lipid panel - CMP14+EGFR  5. Aortic atherosclerosis (HCC) Continue atorvastatin. - Lipid panel - CMP14+EGFR  6. Coronary artery disease involving native coronary artery of native heart without angina pectoris Continue atorvastatin. - Lipid panel - CMP14+EGFR  7. Gastroesophageal reflux disease, unspecified whether esophagitis present Please call patient and follow-up on cologuard that was ordered. Encourage patient to complete and return.  - CMP14+EGFR  8. Constipation, unspecified constipation type Well controlled on current regimen.  - CMP14+EGFR - linaclotide (LINZESS) 72 MCG capsule; Take 1 capsule (72 mcg total) by mouth daily before breakfast.  Dispense: 90 capsule; Refill:  1  9. Bilateral chronic knee pain Education provided on chronic knee pain. Encouraged use of Voltaren gel. X-rays completed today. - DG Knee 1-2 Views Left - DG Knee 1-2 Views Right   Return in about 6 months (around 07/24/2022) for follow-up of chronic medication conditions.  Hendricks Limes, MSN, APRN, FNP-C [facility]  Subjective:    Patient ID: Jeffrey Shaw, male    DOB: Jun 16, 1951, 71 y.o.   MRN: [id number]  Patient Care Team: Loman Brooklyn, FNP as PCP - General (Family Medicine) Earlie Server, MD as Consulting Physician (Orthopedic Surgery) Ilean China, RN as Case Manager   Chief Complaint:  Chief Complaint  Patient presents with   Medical Management of Chronic Issues   Knee Pain    Bilateral x 1 year and getting worse.  States right knee pain is worse     HPI: Jeffrey Shaw is a 71 y.o. male presenting on 01/24/2022 for Medical Management of Chronic Issues and Knee Pain (Bilateral x 1 year and getting worse.  States right knee pain is worse )  Diabetes: Current symptoms include: hyperglycemia and hypoglycemia . Known diabetic complications: cardiovascular disease. Medication compliance: yes. Current diet: in general, a "healthy" diet  . Current exercise: aerobics, bicycling, cardiovascular workout on exercise equipment, swimming, walking and weightlifting. He lifes weights 3x/week and walks for 45 minutes five days/week. Home blood sugar records:  has the [facility] system.  Is he  on ACE inhibitor or angiotensin II receptor blocker? Yes (Lisinopril). Is he on a statin? Yes (Atorvastatin). He is now being managed by endocrinology.  Hypothyroidism: currently taking 175 mcg. Last  dose adjustment in January 2022.   GERD: Patient takes Protonix and/or famotidine as needed, which works well for him.   Hyperlipidemia/CAD/Aortic atherosclerosis: taking atorvastatin and a fish oil/flaxseed combination pill daily.  Constipation: controlled with Linzess and  Miralax as needed.  New complaints: Patient reports bilateral knee pain that is worse on the right side. This has been occurring for a year and getting worse. He feels he has just worn his knees out.    Social history:  Relevant past medical, surgical, family and social history reviewed and updated as indicated. Interim medical history since our last visit reviewed.  Allergies and medications reviewed and updated.  DATA REVIEWED: CHART IN EPIC  ROS: Negative unless specifically indicated above in HPI.    Current Outpatient Medications:    Ascorbic Acid (VITAMIN C WITH ROSE HIPS) 500 MG tablet, Take 500 mg by mouth daily., Disp: , Rfl:    atorvastatin (LIPITOR) 40 MG tablet, Take 1 tablet (40 mg total) by mouth daily., Disp: 90 tablet, Rfl: 2   Continuous Blood Gluc Receiver (DEXCOM G6 RECEIVER) DEVI, 1 Device by Does not apply route daily., Disp: 1 each, Rfl: 0   Continuous Blood Gluc Sensor (DEXCOM G6 SENSOR) MISC, USE ONE SENSOR EVERY 10 DAYS Dx E11.65, Disp: 3 each, Rfl: 2   Continuous Blood Gluc Transmit (DEXCOM G6 TRANSMITTER) MISC, USE EVERY 3 MONTHS, Disp: 2 each, Rfl: 1   famotidine (PEPCID) 20 MG tablet, Take 20 mg by mouth 2 (two) times daily., Disp: , Rfl:    insulin aspart (NOVOLOG) 100 UNIT/ML FlexPen, Inject 8-24 Units into the skin 3 (three) times daily before meals., Disp: , Rfl:    Insulin Detemir (LEVEMIR FLEXTOUCH) 100 UNIT/ML Pen, Inject 10-25 Units into the skin See admin instructions. 10 units qam, 25 units qhs, Disp: , Rfl:    levothyroxine (SYNTHROID) 175 MCG tablet, Take 1 tablet (175 mcg total) by mouth daily before breakfast., Disp: 90 tablet, Rfl: 2   lisinopril (ZESTRIL) 10 MG tablet, TAKE 1 TABLET BY MOUTH DAILY. (GENERIC FOR ZESTRIL), Disp: 90 tablet, Rfl: 2   OVER THE COUNTER MEDICATION, Take 1 capsule by mouth 2 (two) times daily. 3-6-9 fish oil, flax seed combo, Disp: , Rfl:    OZEMPIC, 1 MG/DOSE, 4 MG/3ML SOPN, Inject 1 mg as directed every Sunday.,  Disp: , Rfl:    polyethylene glycol powder (GLYCOLAX/MIRALAX) 17 GM/SCOOP powder, Take 17 g by mouth 2 (two) times daily as needed. (Patient taking differently: Take 17 g by mouth daily.), Disp: 3350 g, Rfl: 1   sildenafil (VIAGRA) 100 MG tablet, TAKE 1 TABLET BY MOUTH ONCE DAILY AS NEEDED FOR  ERECTILE  DYSFUNCTION, Disp: 30 tablet, Rfl: 2   sodium phosphate (FLEET) 7-19 GM/118ML ENEM, Place 133 mLs (1 enema total) rectally daily as needed for moderate constipation or severe constipation., Disp: 399 mL, Rfl: 1   traMADol (ULTRAM) 50 MG tablet, Take 1 tablet (50 mg total) by mouth every 6 (six) hours as needed for severe pain., Disp: 30 tablet, Rfl: 0   zinc gluconate 50 MG tablet, Take 50 mg by mouth daily., Disp: , Rfl:    linaclotide (LINZESS) 72 MCG capsule, Take 1 capsule (72 mcg total) by mouth daily before breakfast., Disp: 90 capsule, Rfl: 1   metFORMIN (GLUCOPHAGE-XR) 500 MG 24 hr tablet, Take 1 tablet (500 mg total) by mouth daily with breakfast., Disp: 90 tablet, Rfl: 1  Allergies  Allergen Reactions   Morphine And Related Nausea Only   Past  Medical History:  Diagnosis Date   Arthritis    CAD (coronary artery disease)    CABG 01/2008 /  Catheterization, November, 2012, LIMA to the LAD patent,  70% distal left main stenosis involving the proximal circumflex, DES to the left main continuing into the circumflex   CORONARY ARTERY BYPASS GRAFT, HX OF 01/17/2010   Qualifier: Diagnosis of  By: Ron Parker, MD, [facility], [place]    Diabetes mellitus    Dr. 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, right radial to PDA, SVG to diagonal, SVG to OM1   Hyperlipemia    Hypothyroid    Primary osteoarthritis of left hip 04/18/2016    Past Surgical History:  Procedure Laterality Date   BACK SURGERY     CARPAL TUNNEL RELEASE     left hand   CORONARY ANGIOPLASTY     CORONARY ARTERY BYPASS GRAFT  01/2008   X 6   INGUINAL  HERNIA REPAIR     right   INGUINAL HERNIA REPAIR Left 09/13/2021   Procedure: HERNIA REPAIR INGUINAL ADULT W/MESH;  Surgeon: Aviva Signs, MD;  Location: [facility];  Service: General;  Laterality: Left;   LEFT HEART CATHETERIZATION WITH CORONARY/GRAFT ANGIOGRAM N/A 10/30/2011   Procedure: LEFT HEART CATHETERIZATION WITH Beatrix Fetters;  Surgeon: Hillary Bow, MD;  Location: [facility] LAB;  Service: Cardiovascular;  Laterality: N/A;   LUMBAR DISC SURGERY     removed   PERCUTANEOUS CORONARY STENT INTERVENTION (PCI-S) N/A 11/03/2011   Procedure: PERCUTANEOUS CORONARY STENT INTERVENTION (PCI-S);  Surgeon: Burnell Blanks, MD;  Location: [facility] CATH LAB;  Service: Cardiovascular;  Laterality: N/A;   TONSILLECTOMY  ~ [place] Left 04/18/2016   Procedure: TOTAL HIP ARTHROPLASTY;  Surgeon: Earlie Server, MD;  Location: [place];  Service: Orthopedics;  Laterality: Left;   UMBILICAL HERNIA REPAIR N/A 09/13/2021   Procedure: HERNIA REPAIR UMBILICAL ADULT W/MESH;  Surgeon: Aviva Signs, MD;  Location: [facility];  Service: General;  Laterality: N/A;   URETEROLITHOTOMY Left    with stone basket removal    Social History   Socioeconomic History   Marital status: Married    Spouse name: SANDRA   Number of children: 0   Years of education: Not on file   Highest education level: 12th grade  Occupational History   Occupation: SELF EMPLOYED  Tobacco Use   Smoking status: Former    Packs/day: 0.80    Years: 6.00    Pack years: 4.80    Types: Cigarettes    Quit date: 12/22/1968    Years since quitting: 53.1   Smokeless tobacco: Never  Vaping Use   Vaping Use: Never used  Substance and Sexual Activity   Alcohol use: Yes    Alcohol/week: 2.0 standard drinks    Types: 2 Cans of beer per week    Comment: maybe one beer a month   Drug use: No   Sexual activity: Yes  Other Topics Concern   Not on file  Social History Narrative   Not on file   Social Determinants of  Health   Financial Resource Strain: Not on file  Food Insecurity: Not on file  Transportation Needs: Not on file  Physical Activity: Not on file  Stress: Not on file  Social Connections: Not on file  Intimate Partner Violence: Not on file        Objective:  BP (!) 149/69    Pulse 64    Temp (!) 97.2 F (36.2 C) (Temporal)    Ht 6' 4" (1.93 m)    Wt 230 lb 9.6 oz (104.6 kg)    SpO2 98%    BMI 28.07 kg/m   Wt Readings from Last 3 Encounters:  01/24/22 230 lb 9.6 oz (104.6 kg)  12/10/21 225 lb 3.2 oz (102.2 kg)  11/11/21 225 lb (102.1 kg)    Physical Exam Vitals reviewed.  Constitutional:      General: He is not in acute distress.    Appearance: Normal appearance. He is overweight. He is not ill-appearing, toxic-appearing or diaphoretic.  HENT:     Head: Normocephalic and atraumatic.  Eyes:     General: No scleral icterus.       Right eye: No discharge.        Left eye: No discharge.     Conjunctiva/sclera: Conjunctivae normal.  Cardiovascular:     Rate and Rhythm: Normal rate and regular rhythm.     Heart sounds: Normal heart sounds. No murmur heard.   No friction rub. No gallop.  Pulmonary:     Effort: Pulmonary effort is normal. No respiratory distress.     Breath sounds: Normal breath sounds. No stridor. No wheezing, rhonchi or rales.  Musculoskeletal:        General: Normal range of motion.     Cervical back: Normal range of motion.     Right knee: No swelling, deformity, effusion, erythema, ecchymosis or lacerations. Normal range of motion. Tenderness present.     Left knee: No swelling, deformity, effusion, erythema, ecchymosis or lacerations. Normal range of motion. Tenderness present.  Skin:    General: Skin is warm and dry.  Neurological:     Mental Status: He is alert and oriented to person, place, and time. Mental status is at baseline.  Psychiatric:        Mood and Affect: Mood normal.        Behavior: Behavior normal.        Thought Content: Thought  content normal.        Judgment: Judgment normal.   Lab Results  Component Value Date   TSH 0.801 04/16/2021   Lab Results  Component Value Date   WBC 10.3 11/11/2021   HGB 13.9 11/11/2021   HCT 41.9 11/11/2021   MCV 93.7 11/11/2021   PLT 225 11/11/2021   Lab Results  Component Value Date   NA 135 11/11/2021   K 5.2 (H) 11/11/2021   CO2 26 11/11/2021   GLUCOSE 140 (H) 11/11/2021   BUN 18 11/11/2021   CREATININE 0.99 11/11/2021   BILITOT 1.6 (H) 11/11/2021   ALKPHOS 97 11/11/2021   AST 19 11/11/2021   ALT 20 11/11/2021   PROT 7.0 11/11/2021   ALBUMIN 3.6 11/11/2021   CALCIUM 9.4 11/11/2021   ANIONGAP 7 11/11/2021   EGFR 67 07/22/2021   Lab Results  Component Value Date   CHOL 124 04/16/2021   Lab Results  Component Value Date   HDL 52 04/16/2021   Lab Results  Component Value Date   LDLCALC 53 04/16/2021   Lab Results  Component Value Date   TRIG 100 04/16/2021   Lab Results  Component Value Date   CHOLHDL 2.4 04/16/2021   Lab Results  Component Value Date   HGBA1C 5.8 (H) 09/10/2021

## 2022-01-24 NOTE — Patient Instructions (Signed)
Voltaren gel for your knees

## 2022-01-25 LAB — CBC WITH DIFFERENTIAL/PLATELET
Basophils Absolute: 0.1 10*3/uL (ref 0.0–0.2)
Basos: 1 %
EOS (ABSOLUTE): 0.1 10*3/uL (ref 0.0–0.4)
Eos: 2 %
Hematocrit: 41.3 % (ref 37.5–51.0)
Hemoglobin: 13.9 g/dL (ref 13.0–17.7)
Immature Grans (Abs): 0 10*3/uL (ref 0.0–0.1)
Immature Granulocytes: 0 %
Lymphocytes Absolute: 2.1 10*3/uL (ref 0.7–3.1)
Lymphs: 33 %
MCH: 30.5 pg (ref 26.6–33.0)
MCHC: 33.7 g/dL (ref 31.5–35.7)
MCV: 91 fL (ref 79–97)
Monocytes Absolute: 0.4 10*3/uL (ref 0.1–0.9)
Monocytes: 7 %
Neutrophils Absolute: 3.7 10*3/uL (ref 1.4–7.0)
Neutrophils: 57 %
Platelets: 257 10*3/uL (ref 150–450)
RBC: 4.55 x10E6/uL (ref 4.14–5.80)
RDW: 12.7 % (ref 11.6–15.4)
WBC: 6.4 10*3/uL (ref 3.4–10.8)

## 2022-01-25 LAB — CMP14+EGFR
ALT: 32 IU/L (ref 0–44)
AST: 34 IU/L (ref 0–40)
Albumin/Globulin Ratio: 2 (ref 1.2–2.2)
Albumin: 4.5 g/dL (ref 3.7–4.7)
Alkaline Phosphatase: 91 IU/L (ref 44–121)
BUN/Creatinine Ratio: 19 (ref 10–24)
BUN: 20 mg/dL (ref 8–27)
Bilirubin Total: 1.7 mg/dL — ABNORMAL HIGH (ref 0.0–1.2)
CO2: 20 mmol/L (ref 20–29)
Calcium: 10 mg/dL (ref 8.6–10.2)
Chloride: 102 mmol/L (ref 96–106)
Creatinine, Ser: 1.06 mg/dL (ref 0.76–1.27)
Globulin, Total: 2.3 g/dL (ref 1.5–4.5)
Glucose: 152 mg/dL — ABNORMAL HIGH (ref 70–99)
Potassium: 5.5 mmol/L — ABNORMAL HIGH (ref 3.5–5.2)
Sodium: 136 mmol/L (ref 134–144)
Total Protein: 6.8 g/dL (ref 6.0–8.5)
eGFR: 75 mL/min/{1.73_m2} (ref >59)

## 2022-01-25 LAB — LIPID PANEL
Chol/HDL Ratio: 2.4 ratio (ref 0.0–5.0)
Cholesterol, Total: 139 mg/dL (ref 100–199)
HDL: 59 mg/dL (ref >39)
LDL Chol Calc (NIH): 68 mg/dL (ref 0–99)
Triglycerides: 53 mg/dL (ref 0–149)
VLDL Cholesterol Cal: 12 mg/dL (ref 5–40)

## 2022-01-25 LAB — T4, FREE: Free T4: 1.73 ng/dL (ref 0.82–1.77)

## 2022-01-25 LAB — TSH: TSH: 2.2 u[IU]/mL (ref 0.450–4.500)

## 2022-01-27 ENCOUNTER — Other Ambulatory Visit: Payer: Self-pay

## 2022-01-27 ENCOUNTER — Encounter: Payer: Self-pay | Admitting: Family Medicine

## 2022-01-27 DIAGNOSIS — K5900 Constipation, unspecified: Secondary | ICD-10-CM | POA: Insufficient documentation

## 2022-01-27 NOTE — Progress Notes (Signed)
RR sent  

## 2022-02-12 DIAGNOSIS — E7800 Pure hypercholesterolemia, unspecified: Secondary | ICD-10-CM | POA: Diagnosis not present

## 2022-02-12 DIAGNOSIS — E039 Hypothyroidism, unspecified: Secondary | ICD-10-CM | POA: Diagnosis not present

## 2022-02-12 DIAGNOSIS — E139 Other specified diabetes mellitus without complications: Secondary | ICD-10-CM | POA: Diagnosis not present

## 2022-02-17 DIAGNOSIS — I10 Essential (primary) hypertension: Secondary | ICD-10-CM | POA: Diagnosis not present

## 2022-02-17 DIAGNOSIS — I2510 Atherosclerotic heart disease of native coronary artery without angina pectoris: Secondary | ICD-10-CM | POA: Diagnosis not present

## 2022-02-17 DIAGNOSIS — E7800 Pure hypercholesterolemia, unspecified: Secondary | ICD-10-CM | POA: Diagnosis not present

## 2022-02-17 DIAGNOSIS — E039 Hypothyroidism, unspecified: Secondary | ICD-10-CM | POA: Diagnosis not present

## 2022-02-19 ENCOUNTER — Other Ambulatory Visit: Payer: Medicare Other

## 2022-02-19 ENCOUNTER — Other Ambulatory Visit: Payer: Self-pay

## 2022-02-19 DIAGNOSIS — R972 Elevated prostate specific antigen [PSA]: Secondary | ICD-10-CM | POA: Diagnosis not present

## 2022-02-20 LAB — PSA: Prostate Specific Ag, Serum: 11 ng/mL — ABNORMAL HIGH (ref 0.0–4.0)

## 2022-03-03 ENCOUNTER — Encounter: Payer: Self-pay | Admitting: Urology

## 2022-03-03 ENCOUNTER — Other Ambulatory Visit: Payer: Self-pay

## 2022-03-03 ENCOUNTER — Ambulatory Visit: Payer: Medicare Other | Admitting: Urology

## 2022-03-03 VITALS — BP 157/76 | HR 76

## 2022-03-03 DIAGNOSIS — R972 Elevated prostate specific antigen [PSA]: Secondary | ICD-10-CM | POA: Diagnosis not present

## 2022-03-03 LAB — URINALYSIS, ROUTINE W REFLEX MICROSCOPIC
Bilirubin, UA: NEGATIVE
Glucose, UA: NEGATIVE
Ketones, UA: NEGATIVE
Leukocytes,UA: NEGATIVE
Nitrite, UA: NEGATIVE
Protein,UA: NEGATIVE
Specific Gravity, UA: 1.025 (ref 1.005–1.030)
Urobilinogen, Ur: 0.2 mg/dL (ref 0.2–1.0)
pH, UA: 5.5 (ref 5.0–7.5)

## 2022-03-03 LAB — MICROSCOPIC EXAMINATION
Bacteria, UA: NONE SEEN
Epithelial Cells (non renal): NONE SEEN /hpf (ref 0–10)
Renal Epithel, UA: NONE SEEN /hpf
WBC, UA: NONE SEEN /hpf (ref 0–5)

## 2022-03-03 NOTE — Progress Notes (Signed)
03/03/2022 11:30 AM   Jeffrey Shaw August 17, 1951 [id number]  Referring provider: Gwenlyn FudgeJoyce, Jeffrey F, FNP [address] [place],  [place] [id number]  Elevated PSA   HPI: Mr Jeffrey Shaw is a 70yo here for followup for elevated PSA> PSA increased to 11.0 from 10.0. He underwent prostate biopsy 6 months ago which was negative. IPSS 4 QOL 0. He has a weaker stream but it does not bother him. No other complaints.    PMH: Past Medical History:  Diagnosis Date   Arthritis    CAD (coronary artery disease)    CABG 01/2008 /  Catheterization, November, 2012, LIMA to the LAD patent,  70% distal left main stenosis involving the proximal circumflex, DES to the left main continuing into the circumflex   CORONARY ARTERY BYPASS GRAFT, HX OF 01/17/2010   Qualifier: Diagnosis of  By: Myrtis SerKatz, MD, [facility], Lemmie EvensJeffrey David    Diabetes mellitus    [facility]. Nonie [facility]oyerantley, [facility]   GERD (gastroesophageal reflux disease)    HTN (hypertension)    Hx of CABG    2009, LIMA to LAD, right radial to PDA, SVG to diagonal, SVG to OM1   Hyperlipemia    Hypothyroid    Primary osteoarthritis of left hip 04/18/2016    Surgical History: Past Surgical History:  Procedure Laterality Date   BACK SURGERY     CARPAL TUNNEL RELEASE     left hand   CORONARY ANGIOPLASTY     CORONARY ARTERY BYPASS GRAFT  01/2008   X 6   INGUINAL HERNIA REPAIR     right   INGUINAL HERNIA REPAIR Left 09/13/2021   Procedure: HERNIA REPAIR INGUINAL ADULT W/MESH;  Surgeon: Jeffrey Shaw, Mark, MD;  Location: [facility];  Service: General;  Laterality: Left;   LEFT HEART CATHETERIZATION WITH CORONARY/GRAFT Shaw N/A 10/30/2011   Procedure: LEFT HEART CATHETERIZATION WITH Jeffrey Shaw;  Surgeon: Jeffrey Abrahamhomas D Stuckey, MD;  [facility];  Service: Cardiovascular;  Laterality: N/A;   LUMBAR DISC SURGERY     removed   PERCUTANEOUS CORONARY STENT INTERVENTION (PCI-S) N/A 11/03/2011   Procedure: PERCUTANEOUS CORONARY STENT INTERVENTION (PCI-S);  Surgeon:  Jeffrey Hazelhristopher D McAlhany, MD;  [facility];  Service: Cardiovascular;  Laterality: N/A;   TONSILLECTOMY  ~ 1955   TOTAL HIP ARTHROPLASTY Left 04/18/2016   Procedure: TOTAL HIP ARTHROPLASTY;  Surgeon: Jeffrey Hammananiel Caffrey, MD;  [facility];  Service: Orthopedics;  Laterality: Left;   UMBILICAL HERNIA REPAIR N/A 09/13/2021   Procedure: HERNIA REPAIR UMBILICAL ADULT W/MESH;  Surgeon: Jeffrey Shaw, Mark, MD;  Location: [facility];  Service: General;  Laterality: N/A;   URETEROLITHOTOMY Left    with stone basket removal    Home Medications:  Allergies as of 03/03/2022       Reactions   Morphine And Related Nausea Only        Medication List        Accurate as of March 03, 2022 11:30 AM. If you have any questions, ask your nurse or doctor.          STOP taking these medications    linaclotide 72 MCG capsule Commonly known as: Linzess Stopped by: Jeffrey AyePatrick Joyous Gleghorn, MD   polyethylene glycol powder 17 GM/SCOOP powder Commonly known as: GLYCOLAX/MIRALAX Stopped by: Jeffrey AyePatrick Pammie Chirino, MD   sodium phosphate 7-19 GM/118ML Enem Stopped by: Jeffrey AyePatrick Ott Zimmerle, MD   traMADol 50 MG tablet Commonly known as: Ultram Stopped by: Jeffrey AyePatrick Juddson Cobern, MD       TAKE these medications    atorvastatin  40 MG tablet Commonly known as: LIPITOR Take 1 tablet (40 mg total) by mouth daily.   Dexcom G6 Receiver Devi 1 Device by Does not apply route daily.   Dexcom G6 Sensor Misc USE ONE SENSOR EVERY 10 DAYS Dx E11.65   Dexcom G6 Transmitter Misc USE EVERY 3 MONTHS   famotidine 20 MG tablet Commonly known as: PEPCID Take 20 mg by mouth 2 (two) times daily.   insulin aspart 100 UNIT/ML FlexPen Commonly known as: NOVOLOG Inject 8-24 Units into the skin 3 (three) times daily before meals.   Levemir FlexTouch 100 UNIT/ML FlexPen Generic drug: insulin detemir Inject 10-25 Units into the skin See admin instructions. 10 units qam, 25 units qhs   levothyroxine 175 MCG tablet Commonly known as:  SYNTHROID Take 1 tablet (175 mcg total) by mouth daily before breakfast.   lisinopril 10 MG tablet Commonly known as: ZESTRIL TAKE 1 TABLET BY MOUTH DAILY. (GENERIC FOR ZESTRIL)   metFORMIN 500 MG 24 hr tablet Commonly known as: GLUCOPHAGE-XR Take 1 tablet (500 mg total) by mouth daily with breakfast.   OVER THE COUNTER MEDICATION Take 1 capsule by mouth 2 (two) times daily. 3-6-9 fish oil, flax seed combo   Ozempic (1 MG/DOSE) 4 MG/3ML Sopn Generic drug: Semaglutide (1 MG/DOSE) Inject 1 mg as directed every Sunday.   sildenafil 100 MG tablet Commonly known as: VIAGRA TAKE 1 TABLET BY MOUTH ONCE DAILY AS NEEDED FOR  ERECTILE  DYSFUNCTION   vitamin C with rose hips 500 MG tablet Take 500 mg by mouth daily.   zinc gluconate 50 MG tablet Take 50 mg by mouth daily.        Allergies:  Allergies  Allergen Reactions   Morphine And Related Nausea Only    Family History: Family History  Problem Relation Age of Onset   Dementia Mother    Liver disease Mother    Suicidality Father    Emphysema Father    Cancer Father    Drug abuse Brother        Clean now   Rheum arthritis Sister    Heart attack Maternal Grandmother    Emphysema Maternal Grandfather    Stroke Maternal Grandfather    Stomach cancer Paternal Grandmother    Diabetes Paternal Aunt    Diabetes Paternal Aunt     Social History:  reports that he quit smoking about 53 years ago. His smoking use included cigarettes. He has a 4.80 pack-year smoking history. He has never used smokeless tobacco. He reports current alcohol use of about 2.0 standard drinks per week. He reports that he does not use drugs.  ROS: All other review of systems were reviewed and are negative except what is noted above in HPI  Physical Exam: BP (!) 157/76    Pulse 76   Constitutional:  Alert and oriented, No acute distress. HEENT: [place] AT, moist mucus membranes.  Trachea midline, no masses. Cardiovascular: No clubbing, cyanosis, or  edema. Respiratory: Normal respiratory effort, no increased work of breathing. GI: Abdomen is soft, nontender, nondistended, no abdominal masses GU: No CVA tenderness.  Lymph: No cervical or inguinal lymphadenopathy. Skin: No rashes, bruises or suspicious lesions. Neurologic: Grossly intact, no focal deficits, moving all 4 extremities. Psychiatric: Normal mood and affect.  Laboratory Data: Lab Results  Component Value Date   WBC 6.4 01/24/2022   HGB 13.9 01/24/2022   HCT 41.3 01/24/2022   MCV 91 01/24/2022   PLT 257 01/24/2022    Lab Results  Component  Value Date   CREATININE 1.06 01/24/2022    No results found for: PSA  No results found for: TESTOSTERONE  Lab Results  Component Value Date   HGBA1C 6.1 (H) 01/24/2022    Urinalysis    Component Value Date/Time   COLORURINE YELLOW 04/07/2016 1008   APPEARANCEUR Clear 08/14/2021 1125   LABSPEC 1.012 04/07/2016 1008   PHURINE 5.5 04/07/2016 1008   GLUCOSEU Negative 08/14/2021 1125   HGBUR NEGATIVE 04/07/2016 1008   BILIRUBINUR Negative 08/14/2021 1125   KETONESUR NEGATIVE 04/07/2016 1008   PROTEINUR Negative 08/14/2021 1125   PROTEINUR NEGATIVE 04/07/2016 1008   UROBILINOGEN 1.0 01/31/2008 0840   NITRITE Negative 08/14/2021 1125   NITRITE NEGATIVE 04/07/2016 1008   LEUKOCYTESUR Negative 08/14/2021 1125    Lab Results  Component Value Date   LABMICR Comment 08/14/2021   BACTERIA RARE 01/31/2008    Pertinent Imaging:  Results for orders placed during the hospital encounter of 05/27/21  DG Abd 1 View  Narrative CLINICAL DATA:  LEFT lower quadrant abdominal pain, on and off constipation and diarrhea  EXAM: ABDOMEN - 1 VIEW  COMPARISON:  None  FINDINGS: Normal bowel gas pattern.  No bowel dilatation or bowel wall thickening.  Degenerative disc disease changes thoracolumbar spine with minimal levoconvex scoliosis.  LEFT hip prosthesis.  No urinary tract calcification.  Lung bases  clear.  IMPRESSION: Normal bowel gas pattern.   Electronically Signed By: Ulyses Southward M.D. On: 05/28/2021 09:26  No results found for this or any previous visit.  No results found for this or any previous visit.  No results found for this or any previous visit.  No results found for this or any previous visit.  No results found for this or any previous visit.  No results found for this or any previous visit.  No results found for this or any previous visit.   Assessment & Plan:    1. Elevated PSA -RTC 6 months with PSA - Urinalysis, Routine w reflex microscopic   No follow-ups on file.  Jeffrey Aye, MD  [facility] [place]

## 2022-03-03 NOTE — Patient Instructions (Signed)
Prostate-Specific Antigen Test °Why am I having this test? °The prostate-specific antigen (PSA) test is a screening test for prostate cancer. It can identify early signs of prostate cancer, which may allow for early detection and more effective treatment. Your health care provider may recommend that you have a PSA test starting at age 72 or that you have one earlier if you are at higher risk for prostate cancer. You may also have a PSA test: °To monitor treatment of prostate cancer. °To check whether prostate cancer has returned after treatment. °What is being tested? °This test measures the amount of PSA in your blood. PSA is a protein that is made in the prostate. The prostate naturally produces more PSA as you age, but very high levels may be a sign of a medical condition. °What kind of sample is taken? °A blood sample is required for this test. It is usually collected by inserting a needle into a blood vessel but can also be collected by sticking a finger with a small needle. Blood for this test should be drawn before having an exam of the prostate that involves digital rectal examination to avoid affecting the results. °How do I prepare for this test? °Do not ejaculate starting 24 hours before your test, or as long as told by your health care provider, as this can cause an elevation in PSA. °Do not undergo any procedures that require manipulation of the prostate, such as biopsy or surgery, for 6 weeks before the test is done as this can cause an elevation in PSA. °Tell a health care provider about: °Any signs you may have of other conditions that can affect PSA levels, such as: °An enlarged prostate that is not caused by cancer (benign prostatic hyperplasia, or BPH). This condition is very common in older men. °A prostate or urinary tract infection. °Any allergies you have. °All medicines you are taking, including vitamins, herbs, eye drops, creams, and over-the-counter medicines. This also includes: °Medicines  to assist with hair growth, such as finasteride. °Any recent exposure to a medicine called diethylstilbestrol (DES). °Medicines such as male hormones (like testosterone) or other medicines that raise testosterone levels. °Any bleeding problems you have. °Any recent procedures you have had, especially any procedures involving the prostate or rectum. °Any medical conditions you have. °How are the results reported? °Your test results will be reported as a value that indicates how much PSA is in your blood. This will be given as nanograms of PSA per milliliter of blood (ng/mL). Your health care provider will compare your results to normal ranges that were established after testing a large group of people (reference ranges). Reference ranges may vary among labs and hospitals. °PSA levels vary from person to person and generally increase with age. Because of this variation, there is no single PSA value that is considered normal for everyone. Instead, PSA reference ranges are used to describe whether your PSA levels are considered low or high (elevated). Common reference ranges are: °Low: 0-2.5 ng/mL. °Slightly to moderately elevated: 2.6-10.0 ng/mL. °Moderately elevated: 10.0-19.9 ng/mL. °Significantly elevated: 20 ng/mL or greater. °What do the results mean? °A test result that is higher than 4 ng/mL may mean that you have prostate cancer. However, a PSA test by itself is not enough to diagnose prostate cancer. High PSA levels may also be caused by the natural aging process, prostate infection (prostatitis), or BPH. °PSA screening cannot tell you if your PSA is high due to cancer or a different cause. °A prostate biopsy   is the only way to diagnose prostate cancer. °A risk of having the PSA test is diagnosing and treating prostate cancer that would never have caused any symptoms or problems (overdiagnosis and overtreatment). °Talk with your health care provider about what your results mean. In some cases, your health care  provider may do more testing to confirm the results. °Questions to ask your health care provider °Ask your health care provider, or the department that is doing the test: °When will my results be ready? °How will I get my results? °What are my treatment options? °What other tests do I need? °What are my next steps? °Summary °The prostate-specific antigen (PSA) test is a screening test for prostate cancer. °Your health care provider may recommend that you have a PSA test starting at age 72 or that you have one earlier if you are at higher risk for prostate cancer. °A test result that is higher than 4 ng/mL may mean that you have prostate cancer. However, elevated levels can be caused by a number of conditions other than prostate cancer. °Talk with your health care provider about what your results mean. °This information is not intended to replace advice given to you by your health care provider. Make sure you discuss any questions you have with your health care provider. °Document Revised: 04/17/2021 Document Reviewed: 04/17/2021 °Elsevier Patient Education © 2022 [employer] ° °

## 2022-03-27 ENCOUNTER — Other Ambulatory Visit: Payer: Self-pay | Admitting: Family Medicine

## 2022-03-27 DIAGNOSIS — E1165 Type 2 diabetes mellitus with hyperglycemia: Secondary | ICD-10-CM

## 2022-04-08 ENCOUNTER — Emergency Department (HOSPITAL_COMMUNITY)
Admission: EM | Admit: 2022-04-08 | Discharge: 2022-04-08 | Disposition: A | Discharge status: Home / Self Care | Payer: Medicare Other | Attending: Emergency Medicine | Admitting: Emergency Medicine

## 2022-04-08 ENCOUNTER — Encounter (HOSPITAL_COMMUNITY): Payer: Self-pay

## 2022-04-08 ENCOUNTER — Emergency Department (HOSPITAL_COMMUNITY): Payer: Medicare Other

## 2022-04-08 ENCOUNTER — Other Ambulatory Visit: Payer: Self-pay

## 2022-04-08 DIAGNOSIS — R7309 Other abnormal glucose: Secondary | ICD-10-CM | POA: Insufficient documentation

## 2022-04-08 DIAGNOSIS — Y92007 Garden or yard of unspecified non-institutional (private) residence as the place of occurrence of the external cause: Secondary | ICD-10-CM | POA: Diagnosis not present

## 2022-04-08 DIAGNOSIS — W19XXXA Unspecified fall, initial encounter: Secondary | ICD-10-CM | POA: Insufficient documentation

## 2022-04-08 DIAGNOSIS — M25552 Pain in left hip: Secondary | ICD-10-CM | POA: Insufficient documentation

## 2022-04-08 MED ORDER — TRAMADOL HCL 50 MG PO TABS
50.0000 mg | ORAL_TABLET | Freq: Four times a day (QID) | ORAL | 0 refills | Status: DC | PRN
Start: 2022-04-08 — End: 2022-07-15

## 2022-04-08 NOTE — ED Triage Notes (Signed)
Reports L hip pain x 2-3 weeks.  Reports has had a could of falls with the last being over a week ago.  Reports has been working in the yard.  Pt is alert and ambulatory with a limp.  Resp even and unlabored.  nad ?

## 2022-04-08 NOTE — Discharge Instructions (Signed)
Your CT today did not show any hardware issues or fractures of your hip.  Your hip pain could be coming from your lower back.  Please keep your upcoming orthopedic appointment for Friday.  I recommend that you use over-the-counter Voltaren gel as directed to your hip and lower back area.  You may take the tramadol as directed if needed for pain.  Turn to the emergency department for any new or worsening symptoms. ?

## 2022-04-09 LAB — CBG MONITORING, ED: Glucose-Capillary: 70 mg/dL (ref 70–99)

## 2022-04-11 DIAGNOSIS — M25551 Pain in right hip: Secondary | ICD-10-CM | POA: Diagnosis not present

## 2022-04-11 NOTE — ED Provider Notes (Signed)
?[facility] EMERGENCY DEPARTMENT ?Provider Note ? ? ?CSN: [id number] ?Arrival date & time: 04/08/22  1051 ? ?  ? ?History ? ?Chief Complaint  ?Patient presents with  ? Hip Pain  ? ? ?Jeffrey Shaw is a 71 y.o. male. ? ? ?Hip Pain ?Pertinent negatives include no chest pain, no abdominal pain and no shortness of breath.  ? ?  ? ?Jeffrey Shaw is a 71 y.o. male who presents to the Emergency Department complaining of left hip pain x 2-3 weeks.  He reports a fall one week ago while working in the yard.  He describes pain associated with walking and certain movements.  Pain improves with rest.  He denies numbness, weakness of the leg.  No urine or bowel changes or abdominal pain. ? ?Home Medications ?Prior to Admission medications   ?Medication Sig Start Date End Date Taking? Authorizing Provider  ?traMADol (ULTRAM) 50 MG tablet Take 1 tablet (50 mg total) by mouth every 6 (six) hours as needed. 04/08/22  Yes Hazle Ogburn, PA-C  ?Ascorbic Acid (VITAMIN C WITH ROSE HIPS) 500 MG tablet Take 500 mg by mouth daily.    [provider]  ?atorvastatin (LIPITOR) 40 MG tablet Take 1 tablet (40 mg total) by mouth daily. 12/23/21   Gwenlyn Fudge, FNP  ?Continuous Blood Gluc Receiver (DEXCOM G6 RECEIVER) DEVI 1 Device by Does not apply route daily. 01/04/21   Bennie Pierini, FNP  ?Continuous Blood Gluc Sensor (DEXCOM G6 SENSOR) MISC USE ONE SENSOR EVERY 10 DAYS 03/31/22   Gwenlyn Fudge, FNP  ?Continuous Blood Gluc Transmit (DEXCOM G6 TRANSMITTER) MISC USE EVERY 3 MONTHS 12/02/21   Gwenlyn Fudge, FNP  ?famotidine (PEPCID) 20 MG tablet Take 20 mg by mouth 2 (two) times daily.    [provider]  ?insulin aspart (NOVOLOG) 100 UNIT/ML FlexPen Inject 8-24 Units into the skin 3 (three) times daily before meals.    [provider]  ?Insulin Detemir (LEVEMIR FLEXTOUCH) 100 UNIT/ML Pen Inject 10-25 Units into the skin See admin instructions. 10 units qam, 25 units qhs 08/17/18   [provider]   ?levothyroxine (SYNTHROID) 175 MCG tablet Take 1 tablet (175 mcg total) by mouth daily before breakfast. 12/23/21   Gwenlyn Fudge, FNP  ?lisinopril (ZESTRIL) 10 MG tablet TAKE 1 TABLET BY MOUTH DAILY. [facility] FOR ZESTRIL) 12/23/21   Gwenlyn Fudge, FNP  ?metFORMIN (GLUCOPHAGE-XR) 500 MG 24 hr tablet Take 1 tablet (500 mg total) by mouth daily with breakfast. 01/24/22   Gwenlyn Fudge, FNP  ?OVER THE COUNTER MEDICATION Take 1 capsule by mouth 2 (two) times daily. 3-6-9 fish oil, flax seed combo    [provider]  ?OZEMPIC, 1 MG/DOSE, 4 MG/3ML SOPN Inject 1 mg as directed every Sunday. 10/15/20   [provider]  ?sildenafil (VIAGRA) 100 MG tablet TAKE 1 TABLET BY MOUTH ONCE DAILY AS NEEDED FOR  ERECTILE  DYSFUNCTION 08/08/21   Gwenlyn Fudge, FNP  ?zinc gluconate 50 MG tablet Take 50 mg by mouth daily.    [provider]  ?   ? ?Allergies    ?Morphine and related   ? ?Review of Systems   ?Review of Systems  ?Constitutional:  Negative for chills and fever.  ?Respiratory:  Negative for shortness of breath.   ?Cardiovascular:  Negative for chest pain.  ?Gastrointestinal:  Negative for abdominal pain, nausea and vomiting.  ?Genitourinary:  Negative for difficulty urinating, dysuria, flank pain, penile swelling, scrotal swelling and testicular  pain.  ?Musculoskeletal:  Positive for arthralgias (left hip pain). Negative for joint swelling.  ?Skin:  Negative for color change and rash.  ?Neurological:  Negative for weakness and numbness.  ?All other systems reviewed and are negative. ? ?Physical Exam ?Updated Vital Signs ?BP (!) 155/78 (BP Location: Left Arm)   Pulse 64   Temp 98.4 ?F (36.9 ?C) (Oral)   Resp 18   Ht 6\' 4"  (1.93 m)   Wt 105.2 kg   SpO2 100%   BMI 28.24 kg/m?  ?Physical Exam ?Vitals and nursing note reviewed.  ?Constitutional:   ?   General: He is not in acute distress. ?   Appearance: Normal appearance. He is not ill-appearing.  ?HENT:  ?   Mouth/Throat:  ?   Mouth:  Mucous membranes are moist.  ?Cardiovascular:  ?   Rate and Rhythm: Normal rate and regular rhythm.  ?   Pulses: Normal pulses.  ?Abdominal:  ?   General: There is no distension.  ?   Palpations: Abdomen is soft.  ?   Tenderness: There is no abdominal tenderness.  ?Musculoskeletal:     ?   General: Tenderness present. No swelling or signs of injury. Normal range of motion.  ?   Right lower leg: No edema.  ?   Left lower leg: No edema.  ?   Comments: Ttp of the left hip.  Pain reproduced with ROM, mostly on abduction of the extremity. no shortening of the extremity or external rotation.    ?Skin: ?   General: Skin is warm.  ?   Capillary Refill: Capillary refill takes less than 2 seconds.  ?   Findings: No erythema or rash.  ?Neurological:  ?   General: No focal deficit present.  ?   Mental Status: He is alert.  ?   Sensory: No sensory deficit.  ?   Motor: No weakness.  ? ? ?ED Results / Procedures / Treatments   ?Labs ?(all labs ordered are listed, but only abnormal results are displayed) ?Labs Reviewed  ?CBG MONITORING, ED  ? ? ?EKG ?None ? ?Radiology ?CT Hip Left Wo Contrast ? ?Result Date: 04/08/2022 ?CLINICAL DATA:  Left hip pain status post fall. EXAM: CT OF THE LEFT HIP WITHOUT CONTRAST TECHNIQUE: Multidetector CT imaging of the left hip was performed according to the standard protocol. Multiplanar CT image reconstructions were also generated. RADIATION DOSE REDUCTION: This exam was performed according to the departmental dose-optimization program which includes automated exposure control, adjustment of the mA and/or kV according to patient size and/or use of iterative reconstruction technique. COMPARISON:  None. FINDINGS: Bones/Joint/Cartilage No acute fracture or dislocation. Left total hip arthroplasty without hardware failure or complication. 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 Degenerative disease with disc height loss partially visualized involving L5-S1. Normal alignment. No aggressive osseous  lesion. Ligaments Ligaments are suboptimally evaluated by CT. Muscles and Tendons Muscles are normal. No muscle atrophy. No intramuscular fluid collection or hematoma. Soft tissue No fluid collection or hematoma. No soft tissue mass. Small fat containing left inguinal hernia. IMPRESSION: 1. No acute fracture or dislocation of the left hip. 2. 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 3. Severe degenerative disease with disc height loss at L5-S1. 4. Left total hip arthroplasty without hardware failure or complication. Electronically Signed   By: 04/10/2022 M.D.   On: 04/08/2022 13:54  ? ?DG Hip Unilat W or Wo Pelvis 2-3 Views Left ? ?Result Date: 04/08/2022 ?CLINICAL DATA:  Left hip pain  for 2-3 weeks EXAM: DG HIP (WITH OR WITHOUT PELVIS) 2-3V LEFT COMPARISON:  04/18/2016 FINDINGS: No acute fracture or dislocation. No aggressive osseous lesion. Normal alignment. Left total hip arthroplasty without failure or complication. Generalized osteopenia. Degenerative disease with disc height loss at L3-4, L4-5 and L5-S1. Soft tissue are unremarkable. No radiopaque foreign body or soft tissue emphysema. IMPRESSION: 1. No acute osseous injury of the left hip. Electronically Signed   By: Elige KoHetal  Patel M.D.   On: 04/08/2022 12:31   ? ? ?Procedures ?Procedures  ? ? ?Medications Ordered in ED ?Medications - No data to display ? ?ED Course/ Medical Decision Making/ A&P ?  ?                        ?Medical Decision Making ?Amount and/or Complexity of Data Reviewed ?Radiology: ordered. ? ?Risk ?Prescription drug management. ? ? ?Pt with left hip pain secondary to fall.  Pain reproduced with walking, standing.  Pain improves at rest.  NV intact.   ? ?XR and CT hip w/o evidence of acute injury.  Pt ambulatory.  He appears appropriate for d/c home, He agrees to close orthopedic f/u.  Short course of pain medication provided.   ? ? ? ? ? ? ? ?Final Clinical Impression(s) / ED Diagnoses ?Final diagnoses:  ?Left hip pain  ? ? ?Rx / DC  Orders ?ED Discharge Orders   ? ?      Ordered  ?  traMADol (ULTRAM) 50 MG tablet  Every 6 hours PRN       ? 04/08/22 1445  ? ?  ?  ? ?  ? ? ?  ?Pauline Ausriplett, Khamari Sheehan, PA-C ?04/12/22 [id number] ? ?  ?Derwood KaplanNanavati, Ankit, MD ?04/19/22 13

## 2022-04-18 ENCOUNTER — Encounter: Payer: Self-pay | Admitting: Family Medicine

## 2022-04-25 IMAGING — DX DG HIP (WITH OR WITHOUT PELVIS) 2-3V*L*
3 series · 3 of 3 positions shown · non-contrast
Comparison: 04/18/2016

CLINICAL DATA: Left hip pain for 2-3 weeks

EXAM:
DG HIP (WITH OR WITHOUT PELVIS) 2-3V LEFT

[hip ap]
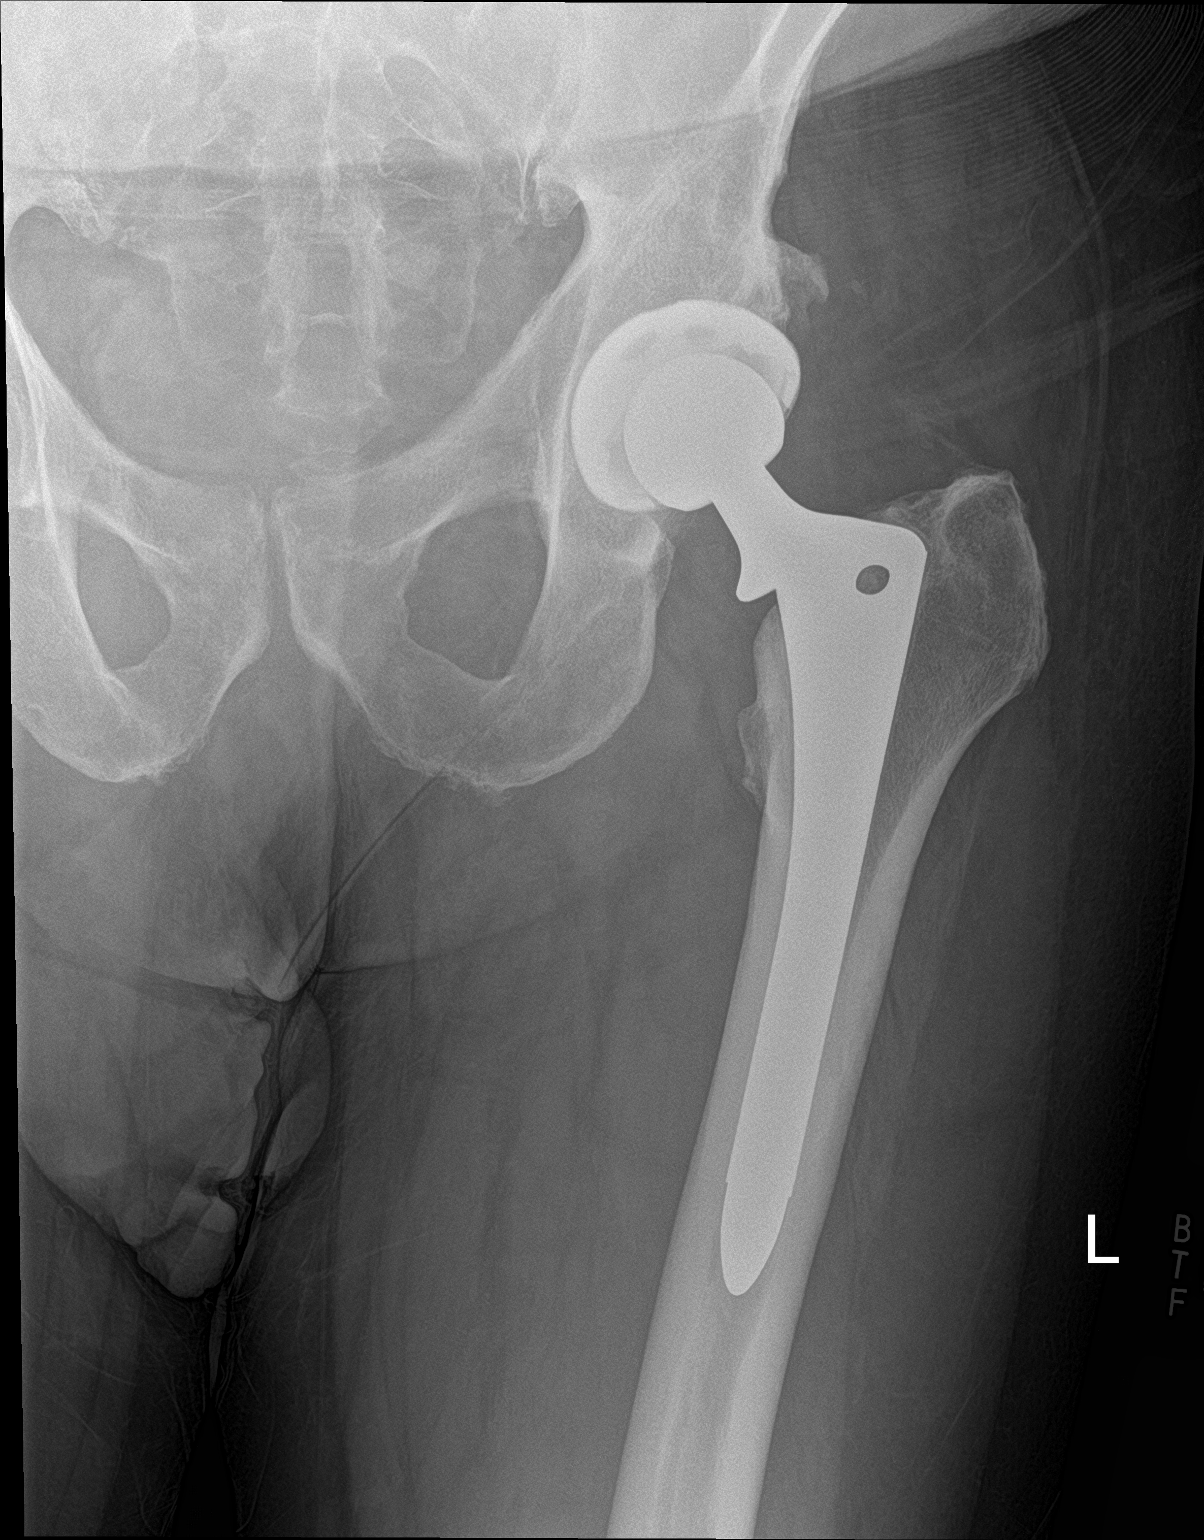

[pelvis ap]
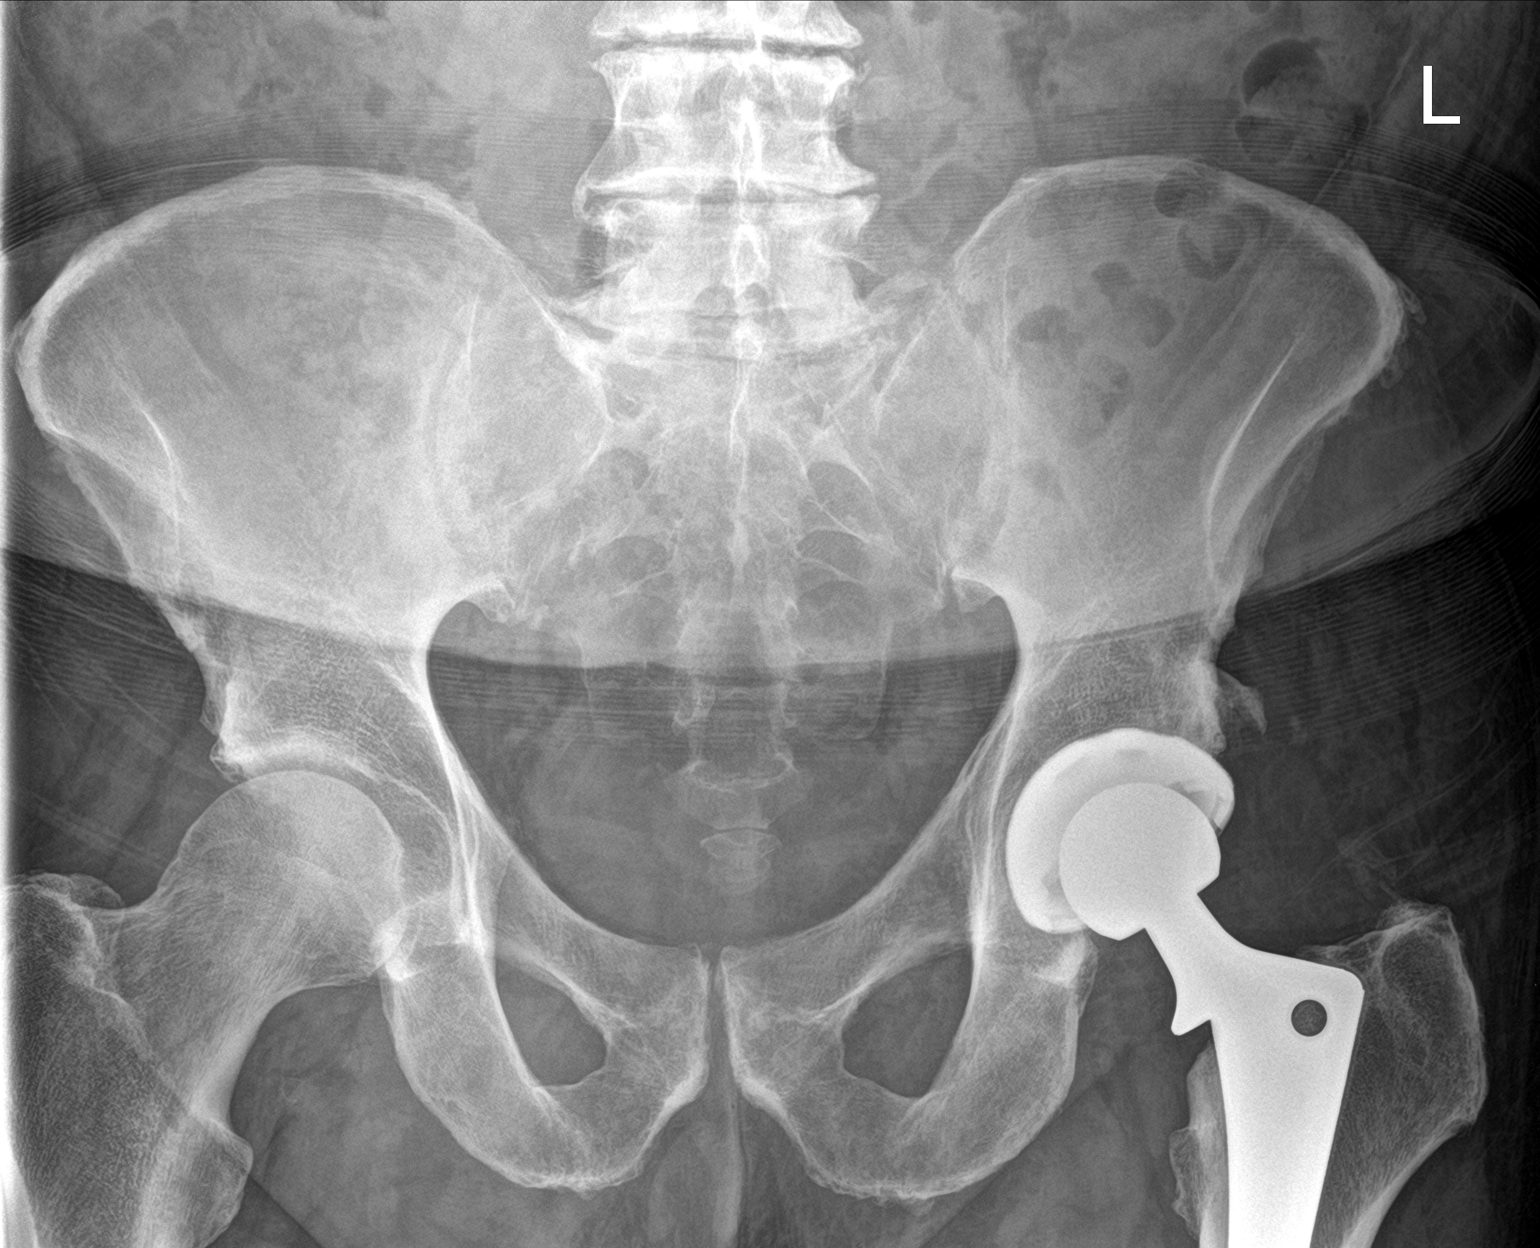

[hip lat]
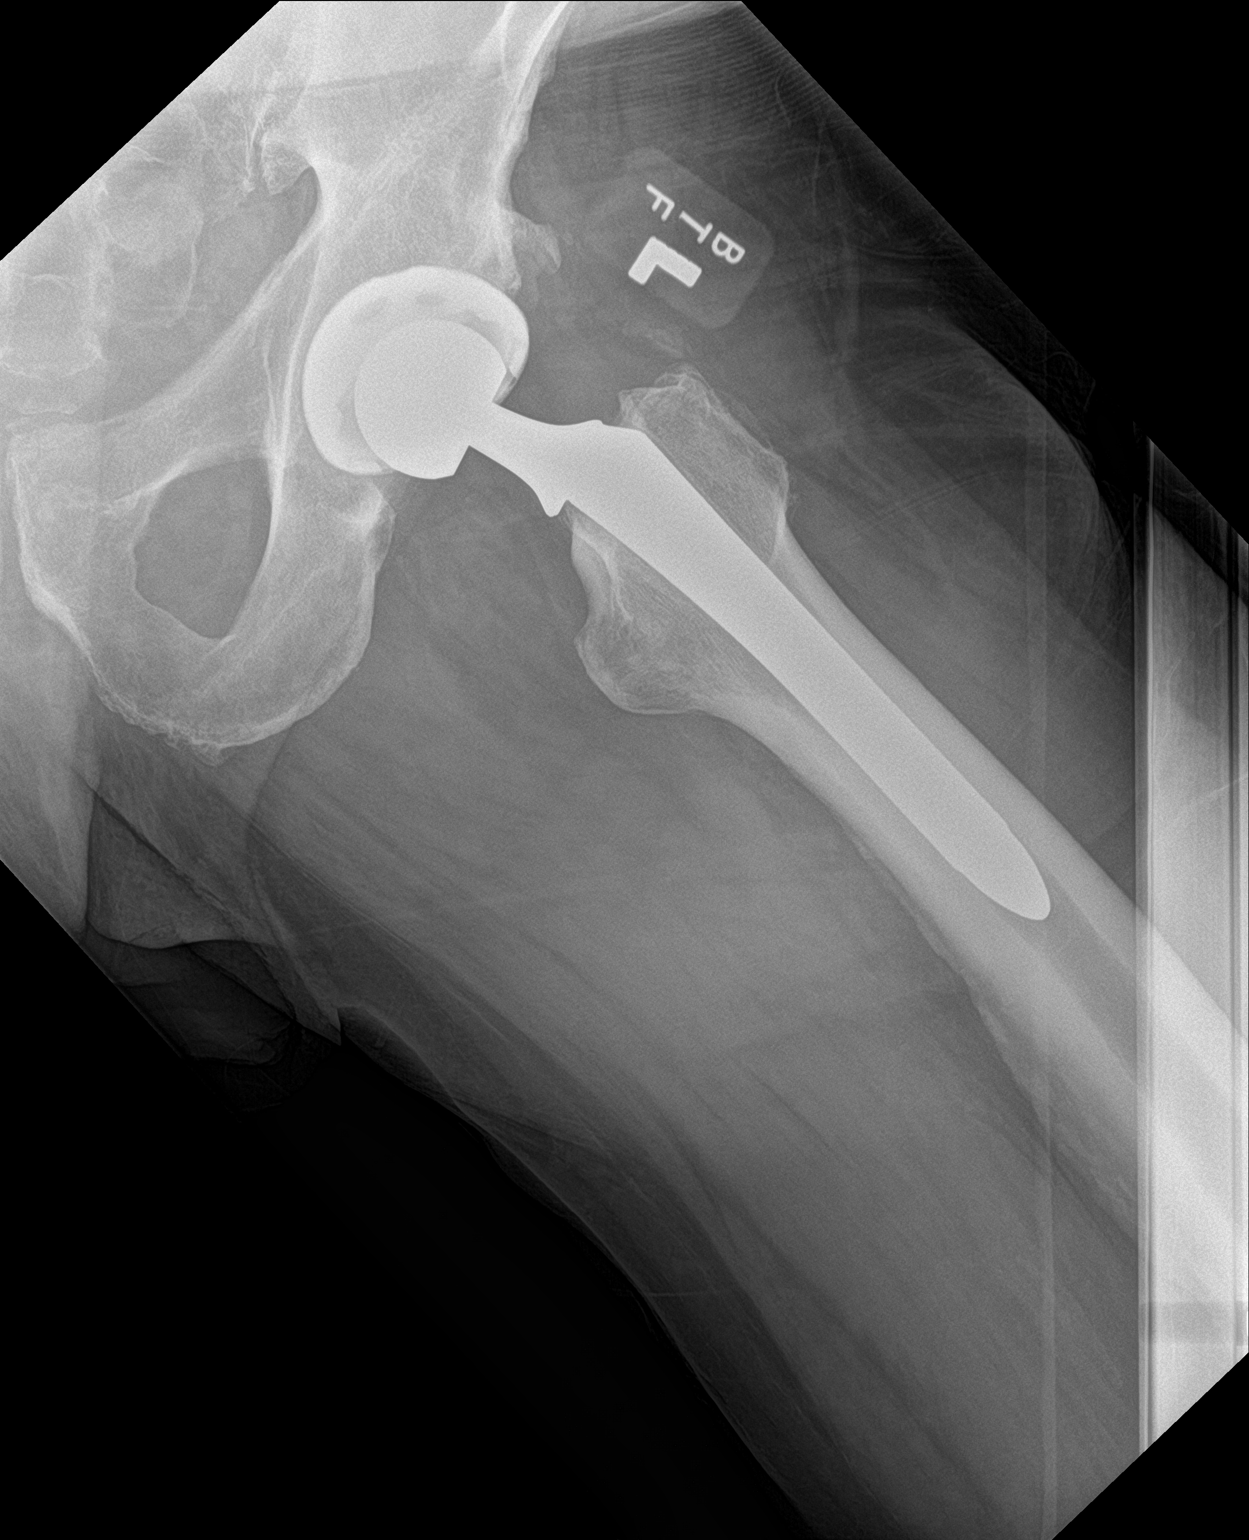

[3 of 3 positions shown; findings below may reference images not displayed]

FINDINGS: No acute fracture or dislocation. No aggressive osseous lesion.
Normal alignment. Left total hip arthroplasty without failure or
complication. Generalized osteopenia. Degenerative disease with disc
height loss at L3-4, L4-5 and L5-S1.

Soft tissue are unremarkable. No radiopaque foreign body or soft
tissue emphysema.
IMPRESSION: 1. No acute osseous injury of the left hip.

## 2022-04-28 ENCOUNTER — Other Ambulatory Visit: Payer: Self-pay | Admitting: Family Medicine

## 2022-04-28 DIAGNOSIS — E1165 Type 2 diabetes mellitus with hyperglycemia: Secondary | ICD-10-CM

## 2022-04-29 ENCOUNTER — Telehealth: Payer: Self-pay

## 2022-04-29 ENCOUNTER — Encounter: Payer: Self-pay | Admitting: Family Medicine

## 2022-04-29 DIAGNOSIS — Z596 Low income: Secondary | ICD-10-CM

## 2022-04-29 DIAGNOSIS — E1165 Type 2 diabetes mellitus with hyperglycemia: Secondary | ICD-10-CM

## 2022-04-29 NOTE — Chronic Care Management (AMB) (Signed)
?  Chronic Care Management  ? ?Outreach Note ? ?04/29/2022 ?Name: FERNANDEZ WEITZEL MRN: [id number] DOB: 05-20-1951 ? ?Jeffrey Shaw is a 71 y.o. year old male who is a primary care patient of Loman Brooklyn, FNP. I reached out to [name] by phone today in response to a referral sent by Mr. Irah Bonenfant [id number] primary care provider. ? ?An unsuccessful telephone outreach was attempted today. The patient was referred to the case management team for assistance with care management and care coordination.  ? ?Follow Up Plan: A HIPAA compliant phone message was left for the patient providing contact information and requesting a return call.  ?The care management team will reach out to the patient again over the next 7 days.  ?If patient returns call to provider office, please advise to call [facility] * at [phone]* ? ?Noreene Larsson, RMA ?Care Guide, Embedded Care Coordination ?[facility]  ?[place], [place] [address] ?Direct Dial: [phone] ?[email or web address] ?Website: [email or web address]  ? ?

## 2022-05-06 NOTE — Chronic Care Management (AMB) (Signed)
?  Chronic Care Management  ? ?Note ? ?05/06/2022 ?Name: LUCCIANO VITALI MRN: [id number] DOB: 1951/09/01 ? ?Jeffrey Shaw is a 71 y.o. year old male who is a primary care patient of Loman Brooklyn, FNP. I reached out to [name] by phone today in response to a referral sent by Mr. RINGO SHEROD PCP. ? ?Mr. Boeke was given information about Chronic Care Management services today including:  ?CCM service includes personalized support from designated clinical staff supervised by his physician, including individualized plan of care and coordination with other care providers ?24/7 contact phone numbers for assistance for urgent and routine care needs. ?Service will only be billed when office clinical staff spend 20 minutes or more in a month to coordinate care. ?Only one practitioner may furnish and bill the service in a calendar month. ?The patient may stop CCM services at any time (effective at the end of the month) by phone call to the office staff. ?The patient is responsible for co-pay (up to 20% after annual deductible is met) if co-pay is required by the individual health plan.  ? ?Patient agreed to services and verbal consent obtained.  ? ?Follow up plan: ?Telephone appointment with care management team member scheduled for:06/11/2022 ? ?Noreene Larsson, RMA ?Care Guide, Embedded Care Coordination ?[facility]  ?[place], [place] [address] ?Direct Dial: [phone] ?[email or web address] ?Website: [email or web address]  ? ?

## 2022-05-23 DIAGNOSIS — H348322 Tributary (branch) retinal vein occlusion, left eye, stable: Secondary | ICD-10-CM | POA: Diagnosis not present

## 2022-05-23 DIAGNOSIS — H25812 Combined forms of age-related cataract, left eye: Secondary | ICD-10-CM | POA: Diagnosis not present

## 2022-05-31 ENCOUNTER — Encounter: Payer: Self-pay | Admitting: Family Medicine

## 2022-05-31 DIAGNOSIS — K5900 Constipation, unspecified: Secondary | ICD-10-CM

## 2022-06-04 ENCOUNTER — Ambulatory Visit (INDEPENDENT_AMBULATORY_CARE_PROVIDER_SITE_OTHER): Payer: Medicare Other

## 2022-06-04 VITALS — Wt 230.0 lb

## 2022-06-04 DIAGNOSIS — Z0000 Encounter for general adult medical examination without abnormal findings: Secondary | ICD-10-CM

## 2022-06-04 NOTE — Progress Notes (Signed)
Subjective:   Jeffrey Shaw is a 71 y.o. male who presents for Medicare Annual/Subsequent preventive examination.  Virtual Visit via Telephone Note  I connected with  Jeffrey Shaw on 06/04/22 at  2:45 PM EDT by telephone and verified that I am speaking with the correct person using two identifiers.  Location: Patient: Home Provider: [facility] Persons participating in the virtual visit: patient/Nurse Health Advisor   I discussed the limitations, risks, security and privacy concerns of performing an evaluation and management service by telephone and the availability of in person appointments. The patient expressed understanding and agreed to proceed.  Interactive audio and video telecommunications were attempted between this nurse and patient, however failed, due to patient having technical difficulties OR patient did not have access to video capability.  We continued and completed visit with audio only.  Some vital signs may be absent or patient reported.   Jeffrey Grill E Macel Yearsley, LPN   Review of Systems     Cardiac Risk Factors include: advanced age (>22men, >68 women);diabetes mellitus;dyslipidemia;hypertension;male gender;Other (see comment), Risk factor comments: CAD, atherosclerosis     Objective:    Today's Vitals   06/04/22 1442  Weight: 230 lb (104.3 kg)   Body mass index is 28 kg/m.     06/04/2022    2:54 PM 04/08/2022   11:07 AM 11/11/2021    7:47 AM 09/13/2021    7:11 AM 09/10/2021    1:46 PM 05/28/2021   10:32 AM 04/04/2020    9:45 AM  Advanced Directives  Does Patient Have a Medical Advance Directive? Yes No Yes Yes Yes Yes No  Type of Estate agent of [place];Living will  Healthcare Power of [place];Living will Healthcare Power of [place];Living will Healthcare Power of [place];Living will Healthcare Power of [place];Living will   Does patient want to make changes to medical advance directive?   No - Patient declined  No - Patient declined No -  Patient declined   Copy of Healthcare Power of Attorney in Chart? Yes - validated most recent copy scanned in chart (See row information)  No - copy requested No - copy requested No - copy requested    Would patient like information on creating a medical advance directive?  No - Patient declined     No - Patient declined    Current Medications (verified) Outpatient Encounter Medications as of 06/04/2022  Medication Sig   atorvastatin (LIPITOR) 40 MG tablet Take 1 tablet (40 mg total) by mouth daily.   Continuous Blood Gluc Receiver (DEXCOM G6 RECEIVER) DEVI 1 Device by Does not apply route daily.   Continuous Blood Gluc Sensor (DEXCOM G6 SENSOR) MISC APPLY 1 SENSOR TOPICALLY EVERY 10 DAYS   Continuous Blood Gluc Transmit (DEXCOM G6 TRANSMITTER) MISC USE EVERY 3 MONTHS   famotidine (PEPCID) 20 MG tablet Take 20 mg by mouth 2 (two) times daily.   insulin aspart (NOVOLOG) 100 UNIT/ML FlexPen Inject 8-24 Units into the skin 3 (three) times daily before meals.   Insulin Detemir (LEVEMIR FLEXTOUCH) 100 UNIT/ML Pen Inject 10-25 Units into the skin See admin instructions. 10 units qam, 25 units qhs   levothyroxine (SYNTHROID) 175 MCG tablet Take 1 tablet (175 mcg total) by mouth daily before breakfast.   lisinopril (ZESTRIL) 10 MG tablet TAKE 1 TABLET BY MOUTH DAILY. (GENERIC FOR ZESTRIL)   metFORMIN (GLUCOPHAGE-XR) 500 MG 24 hr tablet Take 1 tablet (500 mg total) by mouth daily with breakfast.   OVER THE COUNTER MEDICATION Take 1 capsule  by mouth 2 (two) times daily. 3-6-9 fish oil, flax seed combo   OZEMPIC, 1 MG/DOSE, 4 MG/3ML SOPN Inject 1 mg as directed every Sunday.   sildenafil (VIAGRA) 100 MG tablet TAKE 1 TABLET BY MOUTH ONCE DAILY AS NEEDED FOR  ERECTILE  DYSFUNCTION   traMADol (ULTRAM) 50 MG tablet Take 1 tablet (50 mg total) by mouth every 6 (six) hours as needed. (Patient not taking: Reported on 06/04/2022)   [DISCONTINUED] Ascorbic Acid (VITAMIN C WITH ROSE HIPS) 500 MG tablet Take 500 mg  by mouth daily.   [DISCONTINUED] zinc gluconate 50 MG tablet Take 50 mg by mouth daily.   No facility-administered encounter medications on file as of 06/04/2022.    Allergies (verified) Morphine and related   History: Past Medical History:  Diagnosis Date   Allergy morphine   Arthritis    CAD (coronary artery disease)    CABG 01/2008 /  Catheterization, November, 2012, LIMA to the LAD patent,  70% distal left main stenosis involving the proximal circumflex, DES to the left main continuing into the circumflex   CORONARY ARTERY BYPASS GRAFT, HX OF 01/17/2010   Qualifier: Diagnosis of  By: Myrtis Ser, MD, [facility], [place]    Diabetes mellitus    Dr. Nonie Hoyer, [facility]   GERD (gastroesophageal reflux disease)    HTN (hypertension)    Hx of CABG    2009, LIMA to LAD, right radial to PDA, SVG to diagonal, SVG to OM1   Hyperlipemia    Hypothyroid    Primary osteoarthritis of left hip 04/18/2016   Past Surgical History:  Procedure Laterality Date   BACK SURGERY     CARPAL TUNNEL RELEASE     left hand   CORONARY ANGIOPLASTY     CORONARY ARTERY BYPASS GRAFT  01/2008   X 6   INGUINAL HERNIA REPAIR     right   INGUINAL HERNIA REPAIR Left 09/13/2021   Procedure: HERNIA REPAIR INGUINAL ADULT W/MESH;  Surgeon: [name], MD;  Location: [facility];  Service: General;  Laterality: Left;   JOINT REPLACEMENT  hip   LEFT HEART CATHETERIZATION WITH CORONARY/GRAFT ANGIOGRAM N/A 10/30/2011   Procedure: LEFT HEART CATHETERIZATION WITH Isabel Caprice;  Surgeon: Herby Abraham, MD;  Location: [facility] CATH LAB;  Service: Cardiovascular;  Laterality: N/A;   LUMBAR DISC SURGERY     removed   PERCUTANEOUS CORONARY STENT INTERVENTION (PCI-S) N/A 11/03/2011   Procedure: PERCUTANEOUS CORONARY STENT INTERVENTION (PCI-S);  Surgeon: Kathleene Hazel, MD;  [facility] C[facility] LAB;  Service: Cardiovascular;  Laterality: N/A;   TONSILLECTOMY  ~ 1955   TOTAL HIP ARTHROPLASTY Left 04/18/2016   Procedure:  TOTAL HIP ARTHROPLASTY;  Surgeon: Frederico Hamman, MD;  Location: [facility] OR;  Service: Orthopedics;  Laterality: Left;   UMBILICAL HERNIA REPAIR N/A 09/13/2021   Procedure: HERNIA REPAIR UMBILICAL ADULT W/MESH;  Surgeon: [name], MD;  Location: [facility];  Service: General;  Laterality: N/A;   URETEROLITHOTOMY Left    with stone basket removal   Family History  Problem Relation Age of Onset   Dementia Mother    Liver disease Mother    Suicidality Father    Emphysema Father    Cancer Father    Drug abuse Brother        Clean now   Rheum arthritis Sister    Heart attack Maternal Grandmother    Emphysema Maternal Grandfather    Stroke Maternal Grandfather    Stomach cancer Paternal Grandmother    Diabetes Paternal Aunt  Diabetes Paternal Aunt    Social History   Socioeconomic History   Marital status: Married    Spouse name: SANDRA   Number of children: 0   Years of education: Not on file   Highest education level: 12th grade  Occupational History   Occupation: SELF EMPLOYED    Comment: retired   Occupation: Marine scientistclerk    Employer: [employer]    Comment: 5 hours/ 5 days per week  Tobacco Use   Smoking status: Former    Packs/day: 1.00    Years: 4.00    Total pack years: 4.00    Types: Cigarettes    Quit date: 12/22/1968    Years since quitting: 53.4   Smokeless tobacco: Never  Vaping Use   Vaping Use: Never used  Substance and Sexual Activity   Alcohol use: Yes    Comment: maybe one beer a month   Drug use: No   Sexual activity: Yes    Birth control/protection: None  Other Topics Concern   Not on file  Social History Narrative   Lives in one level home with his wife.   Very active - works at [employer] - lifts weights, plays basketball, walks daily   Social Determinants of Health   Financial Resource Strain: Low Risk  (06/04/2022)   Overall Financial Resource Strain (CARDIA)    Difficulty of Paying Living Expenses: Not hard at all  Food Insecurity: No Food Insecurity  (06/04/2022)   Hunger Vital Sign    Worried About Running Out of Food in the Last Year: Never true    Ran Out of Food in the Last Year: Never true  Transportation Needs: No Transportation Needs (06/04/2022)   PRAPARE - Administrator, Civil ServiceTransportation    Lack of Transportation (Medical): No    Lack of Transportation (Non-Medical): No  Physical Activity: Sufficiently Active (06/04/2022)   Exercise Vital Sign    Days of Exercise per Week: 5 days    Minutes of Exercise per Session: 60 min  Stress: No Stress Concern Present (06/04/2022)   [employer] - Occupational Stress Questionnaire    Feeling of Stress : Not at all  Social Connections: Moderately Integrated (06/04/2022)   Social Connection and Isolation Panel [NHANES]    Frequency of Communication with Friends and Family: Three times a week    Frequency of Social Gatherings with Friends and Family: Twice a week    Attends Religious Services: Never    Database administratorActive Member of Clubs or Organizations: Yes    Attends Engineer, structuralClub or Organization Meetings: More than 4 times per year    Marital Status: Married    Tobacco Counseling Counseling given: Not Answered   Clinical Intake:  Pre-visit preparation completed: Yes  Pain : No/denies pain     BMI - recorded: 28 Nutritional Status: BMI 25 -29 Overweight Nutritional Risks: None Diabetes: Yes CBG done?: No Did pt. bring in CBG monitor from home?: No  How often do you need to have someone help you when you read instructions, pamphlets, or other written materials from your doctor or pharmacy?: 1 - Never  Diabetic? Nutrition Risk Assessment:  Has the patient had any N/V/D within the last 2 months?  No  Does the patient have any non-healing wounds?  No  Has the patient had any unintentional weight loss or weight gain?  No   Diabetes:  Is the patient diabetic?  Yes  If diabetic, was a CBG obtained today?  No  Did the patient bring in their glucometer  from home?  No  How often do  you monitor your CBG's? Frequently, has a DEXCOM - CGM - 129 this am fasting per patient.   Financial Strains and Diabetes Management:  Are you having any financial strains with the device, your supplies or your medication? No  Jeffrey Shaw, [facility] pharmacist has helped get assistance Does the patient want to be seen by Chronic Care Management for management of their diabetes?  No  Would the patient like to be referred to a Nutritionist or for Diabetic Management?  No   Diabetic Exams:  Diabetic Eye Exam: Completed 12/2021 - qill request records.  Diabetic Foot Exam: Completed 07/19/2021. Pt has been advised about the importance in completing this exam. Pt is scheduled for diabetic foot exam on next appt with PCP.    Interpreter Needed?: No  Information entered by :: Jeffrey Heilman, LPN   Activities of Daily Living    06/04/2022    2:39 PM 05/31/2022    9:32 AM  In your present state of health, do you have any difficulty performing the following activities:  Hearing? 0 0  Vision? 0 0  Difficulty concentrating or making decisions? 0 0  Walking or climbing stairs? 0 0  Dressing or bathing? 0 0  Doing errands, shopping? 0 0  Preparing Food and eating ? N N  Using the Toilet? N N  In the past six months, have you accidently leaked urine? N N  Do you have problems with loss of bowel control? N N  Managing your Medications? N N  Managing your Finances? N N  Housekeeping or managing your Housekeeping? N N    Patient Care Team: Gwenlyn Fudge, FNP as PCP - General (Family Medicine) Gwenith Daily, RN as Case Manager Michaelle Copas, MD as Referring Physician (Optometry) Cresenciano Genre Lilla Shook, [facility] as [facility] Care Management (Pharmacist) Dorisann Frames, MD as Referring Physician (Endocrinology) Ronne Binning Mardene Celeste, MD as Consulting Physician (Urology)  Indicate any recent Medical Services you may have received from other than [facility] providers in the past year (date may be  approximate).     Assessment:   This is a routine wellness examination for Jeffrey Shaw.  Hearing/Vision screen Hearing Screening - Comments:: C/o mild hearing difficulties   Vision Screening - Comments:: Wears rx glasses - up to date with routine eye exams with [facility] in [place]  Dietary issues and exercise activities discussed: Current Exercise Habits: Home exercise routine, Type of exercise: strength training/weights;walking;Other - see comments (basketball), Time (Minutes): 60, Frequency (Times/Week): 5, Weekly Exercise (Minutes/Week): 300, Intensity: Moderate, Exercise limited by: cardiac condition(s)   Goals Addressed             This Visit's Progress    AWV   On track    06/04/2022 AWV Goal: Diabetes Management  Patient will maintain an A1C level below 8.0 Patient will not develop any diabetic foot complications Patient will not experience any hypoglycemic episodes over the next 3 months Patient will notify our office of any CBG readings outside of the provider recommended range by calling [phone] Patient will adhere to provider recommendations for diabetes management  Patient Self Management Activities take all medications as prescribed and report any negative side effects monitor and record blood sugar readings as directed adhere to a low carbohydrate diet that incorporates lean proteins, vegetables, whole grains, low glycemic fruits check feet daily noting any sores, cracks, injuries, or callous formations see PCP or podiatrist if he notices  any changes in his legs, feet, or toenails Patient will visit PCP and have an A1C level checked every 3 to 6 months as directed  have a yearly eye exam to monitor for vascular changes associated with diabetes and will request that the report be sent to his pcp.  consult with his PCP regarding any changes in his health or new or worsening symptoms        Depression Screen    06/04/2022    2:55 PM 01/24/2022   10:24 AM  12/10/2021   10:54 AM 07/19/2021   11:01 AM 05/28/2021   10:32 AM 05/27/2021   10:50 AM 04/16/2021    1:33 PM  PHQ 2/9 Scores  PHQ - 2 Score 0 0 0 0 0 0 0  PHQ- 9 Score  0 0 0  0     Fall Risk    06/04/2022    2:43 PM 05/31/2022    9:32 AM 01/24/2022   10:23 AM 12/16/2021    9:52 AM 12/10/2021   10:54 AM  Fall Risk   Falls in the past year? 1 0 0 0   0 0  Number falls in past yr: 0 0  0   0   Injury with Fall? 0      Risk for fall due to : History of fall(s);Orthopedic patient      Follow up Falls prevention discussed        FALL RISK PREVENTION PERTAINING TO THE HOME:  Any stairs in or around the home? Yes  If so, are there any without handrails? Yes  outdoor stairs to basement against a wall to support Home free of loose throw rugs in walkways, pet beds, electrical cords, etc? Yes  Adequate lighting in your home to reduce risk of falls? Yes   ASSISTIVE DEVICES UTILIZED TO PREVENT FALLS:  Life alert? No  Use of a cane, walker or w/c? No  Grab bars in the bathroom? No  Shower chair or bench in shower? No  Elevated toilet seat or a handicapped toilet? No   TIMED UP AND GO:  Was the test performed? No . Telephonic visit  Cognitive Function:        06/04/2022    2:40 PM 05/28/2021   10:34 AM 04/04/2020    9:48 AM  6CIT Screen  What Year? 0 points 0 points 0 points  What month? 0 points 0 points 0 points  What time? 0 points 0 points 0 points  Count back from 20 0 points 0 points 0 points  Months in reverse 0 points 0 points 0 points  Repeat phrase 0 points 0 points 0 points  Total Score 0 points 0 points 0 points    Immunizations Immunization History  Administered Date(s) Administered   Influenza Split 10/07/2017   Influenza,inj,quad, With Preservative 09/24/2018, 10/10/2019   Influenza-Unspecified 10/13/2008, 10/06/2014, 10/05/2015, 09/24/2016, 09/24/2018, 10/05/2020   Pneumococcal Conjugate-13 10/06/2014   Pneumococcal Polysaccharide-23 10/09/1997, 01/07/2019    Td 09/02/2004   Tdap 03/14/2013   Zoster Recombinat (Shingrix) 09/28/2020, 03/07/2021   Zoster, Live 07/29/2013    TDAP status: Up to date  Flu Vaccine status: Up to date  Pneumococcal vaccine status: Up to date  Covid-19 vaccine status: Declined, Education has been provided regarding the importance of this vaccine but patient still declined. Advised may receive this vaccine at local pharmacy or Health Dept.or vaccine clinic. Aware to provide a copy of the vaccination record if obtained from local pharmacy or Health Dept. Verbalized  acceptance and understanding.  Qualifies for Shingles Vaccine? Yes   Zostavax completed Yes   Shingrix Completed?: Yes  Screening Tests Health Maintenance  Topic Date Due   OPHTHALMOLOGY EXAM  03/19/2022   FOOT EXAM  07/19/2022   INFLUENZA VACCINE  07/22/2022   HEMOGLOBIN A1C  07/24/2022   Fecal DNA (Cologuard)  08/14/2022   TETANUS/TDAP  03/15/2023   Pneumonia Vaccine 28+ Years old  Completed   Hepatitis C Screening  Completed   Zoster Vaccines- Shingrix  Completed   HPV VACCINES  Aged Out   COVID-19 Vaccine  Discontinued    Health Maintenance  Health Maintenance Due  Topic Date Due   OPHTHALMOLOGY EXAM  03/19/2022    Colorectal cancer screening: Type of screening: Cologuard. Completed 08/15/2019. Repeat every 3 years  Lung Cancer Screening: (Low Dose CT Chest recommended if Age 83-80 years, 30 pack-year currently smoking OR have quit w/in 15years.) does not qualify.    Additional Screening:  Hepatitis C Screening: does qualify; Completed 04/19/2018  Vision Screening: Recommended annual ophthalmology exams for early detection of glaucoma and other disorders of the eye. Is the patient up to date with their annual eye exam?  Yes  Who is the provider or what is the name of the office in which the patient attends annual eye exams? [facility] - [place] If pt is not established with a provider, would they like to be referred to a  provider to establish care? No .   Dental Screening: Recommended annual dental exams for proper oral hygiene  Community Resource Referral / Chronic Care Management: CRR required this visit?  No   CCM required this visit?  No      Plan:     I have personally reviewed and noted the following in the patient's chart:   Medical and social history Use of alcohol, tobacco or illicit drugs  Current medications and supplements including opioid prescriptions. Patient is not currently taking opioid prescriptions. Functional ability and status Nutritional status Physical activity Advanced directives List of other physicians Hospitalizations, surgeries, and ER visits in previous 12 months Vitals Screenings to include cognitive, depression, and falls Referrals and appointments  In addition, I have reviewed and discussed with patient certain preventive protocols, quality metrics, and best practice recommendations. A written personalized care plan for preventive services as well as general preventive health recommendations were provided to patient.   Due to this being a virtual visit, the after visit summary with patients personalized plan was offered to patient via mail or my-chart. Patient would like to access on my-chart.   [place] Constable, LPN   04/23/5464   Nurse Notes: None

## 2022-06-04 NOTE — Patient Instructions (Signed)
Mr. Jeffrey Shaw , Thank you for taking time to come for your Medicare Wellness Visit. I appreciate your ongoing commitment to your health goals. Please review the following plan we discussed and let me know if I can assist you in the future.   Screening recommendations/referrals: Colonoscopy: Cologuard done 08/15/2019 - repeat in 3 years - coming up in a couple of months Recommended yearly ophthalmology/optometry visit for glaucoma screening and checkup Recommended yearly dental visit for hygiene and checkup  Vaccinations: Influenza vaccine: Done at [facility] 2022 - Repeat annually  Pneumococcal vaccine: Done  10/06/2014 & 01/07/2019 Tdap vaccine: Done 03/14/2013 - Repeat in 10 years Shingles vaccine: Done  09/28/2020 & 03/07/2021 Covid-19: Declined  Advanced directives: in chart  Conditions/risks identified: Keep up the great work!   Next appointment: Follow up in one year for your annual wellness visit.   Preventive Care 39 Years and Older, Male  Preventive care refers to lifestyle choices and visits with your health care provider that can promote health and wellness. What does preventive care include? A yearly physical exam. This is also called an annual well check. Dental exams once or twice a year. Routine eye exams. Ask your health care provider how often you should have your eyes checked. Personal lifestyle choices, including: Daily care of your teeth and gums. Regular physical activity. Eating a healthy diet. Avoiding tobacco and drug use. Limiting alcohol use. Practicing safe sex. Taking low doses of aspirin every day. Taking vitamin and mineral supplements as recommended by your health care provider. What happens during an annual well check? The services and screenings done by your health care provider during your annual well check will depend on your age, overall health, lifestyle risk factors, and family history of disease. Counseling  Your health care provider may ask  you questions about your: Alcohol use. Tobacco use. Drug use. Emotional well-being. Home and relationship well-being. Sexual activity. Eating habits. History of falls. Memory and ability to understand (cognition). Work and work Astronomer. Screening  You may have the following tests or measurements: Height, weight, and BMI. Blood pressure. Lipid and cholesterol levels. These may be checked every 5 years, or more frequently if you are over 71 years old. Skin check. Lung cancer screening. You may have this screening every year starting at age 72 if you have a 30-pack-year history of smoking and currently smoke or have quit within the past 15 years. Fecal occult blood test (FOBT) of the stool. You may have this test every year starting at age 72. Flexible sigmoidoscopy or colonoscopy. You may have a sigmoidoscopy every 5 years or a colonoscopy every 10 years starting at age 72. Prostate cancer screening. Recommendations will vary depending on your family history and other risks. Hepatitis C blood test. Hepatitis B blood test. Sexually transmitted disease (STD) testing. Diabetes screening. This is done by checking your blood sugar (glucose) after you have not eaten for a while (fasting). You may have this done every 1-3 years. Abdominal aortic aneurysm (AAA) screening. You may need this if you are a current or former smoker. Osteoporosis. You may be screened starting at age 72 if you are at high risk. Talk with your health care provider about your test results, treatment options, and if necessary, the need for more tests. Vaccines  Your health care provider may recommend certain vaccines, such as: Influenza vaccine. This is recommended every year. Tetanus, diphtheria, and acellular pertussis (Tdap, Td) vaccine. You may need a Td booster every 10 years. Zoster vaccine.  You may need this after age 72. Pneumococcal 13-valent conjugate (PCV13) vaccine. One dose is recommended after age  72. Pneumococcal polysaccharide (PPSV23) vaccine. One dose is recommended after age 72. Talk to your health care provider about which screenings and vaccines you need and how often you need them. This information is not intended to replace advice given to you by your health care provider. Make sure you discuss any questions you have with your health care provider. Document Released: 01/04/2016 Document Revised: 08/27/2016 Document Reviewed: 10/09/2015 Elsevier Interactive Patient Education  2017 [place] Prevention in the Home Falls can cause injuries. They can happen to people of all ages. There are many things you can do to make your home safe and to help prevent falls. What can I do on the outside of my home? Regularly fix the edges of walkways and driveways and fix any cracks. Remove anything that might make you trip as you walk through a door, such as a raised step or threshold. Trim any bushes or trees on the path to your home. Use bright outdoor lighting. Clear any walking paths of anything that might make someone trip, such as rocks or tools. Regularly check to see if handrails are loose or broken. Make sure that both sides of any steps have handrails. Any raised decks and porches should have guardrails on the edges. Have any leaves, snow, or ice cleared regularly. Use sand or salt on walking paths during winter. Clean up any spills in your garage right away. This includes oil or grease spills. What can I do in the bathroom? Use night lights. Install grab bars by the toilet and in the tub and shower. Do not use towel bars as grab bars. Use non-skid mats or decals in the tub or shower. If you need to sit down in the shower, use a plastic, non-slip stool. Keep the floor dry. Clean up any water that spills on the floor as soon as it happens. Remove soap buildup in the tub or shower regularly. Attach bath mats securely with double-sided non-slip rug tape. Do not have throw  rugs and other things on the floor that can make you trip. What can I do in the bedroom? Use night lights. Make sure that you have a light by your bed that is easy to reach. Do not use any sheets or blankets that are too big for your bed. They should not hang down onto the floor. Have a firm chair that has side arms. You can use this for support while you get dressed. Do not have throw rugs and other things on the floor that can make you trip. What can I do in the kitchen? Clean up any spills right away. Avoid walking on wet floors. Keep items that you use a lot in easy-to-reach places. If you need to reach something above you, use a strong step stool that has a grab bar. Keep electrical cords out of the way. Do not use floor polish or wax that makes floors slippery. If you must use wax, use non-skid floor wax. Do not have throw rugs and other things on the floor that can make you trip. What can I do with my stairs? Do not leave any items on the stairs. Make sure that there are handrails on both sides of the stairs and use them. Fix handrails that are broken or loose. Make sure that handrails are as long as the stairways. Check any carpeting to make sure that it is  firmly attached to the stairs. Fix any carpet that is loose or worn. Avoid having throw rugs at the top or bottom of the stairs. If you do have throw rugs, attach them to the floor with carpet tape. Make sure that you have a light switch at the top of the stairs and the bottom of the stairs. If you do not have them, ask someone to add them for you. What else can I do to help prevent falls? Wear shoes that: Do not have high heels. Have rubber bottoms. Are comfortable and fit you well. Are closed at the toe. Do not wear sandals. If you use a stepladder: Make sure that it is fully opened. Do not climb a closed stepladder. Make sure that both sides of the stepladder are locked into place. Ask someone to hold it for you, if  possible. Clearly mark and make sure that you can see: Any grab bars or handrails. First and last steps. Where the edge of each step is. Use tools that help you move around (mobility aids) if they are needed. These include: Canes. Walkers. Scooters. Crutches. Turn on the lights when you go into a dark area. Replace any light bulbs as soon as they burn out. Set up your furniture so you have a clear path. Avoid moving your furniture around. If any of your floors are uneven, fix them. If there are any pets around you, be aware of where they are. Review your medicines with your doctor. Some medicines can make you feel dizzy. This can increase your chance of falling. Ask your doctor what other things that you can do to help prevent falls. This information is not intended to replace advice given to you by your health care provider. Make sure you discuss any questions you have with your health care provider. Document Released: 10/04/2009 Document Revised: 05/15/2016 Document Reviewed: 01/12/2015 Elsevier Interactive Patient Education  2017 [employer].

## 2022-06-05 MED ORDER — LINACLOTIDE 72 MCG PO CAPS: 72.0000 ug | ORAL_CAPSULE | Freq: Every day | ORAL | 1 refills | Status: DC

## 2022-06-11 ENCOUNTER — Ambulatory Visit: Payer: Medicare Other

## 2022-06-19 ENCOUNTER — Ambulatory Visit (INDEPENDENT_AMBULATORY_CARE_PROVIDER_SITE_OTHER): Payer: Medicare Other | Admitting: Pharmacist

## 2022-06-19 DIAGNOSIS — E1165 Type 2 diabetes mellitus with hyperglycemia: Secondary | ICD-10-CM

## 2022-06-19 DIAGNOSIS — I700 Atherosclerosis of aorta: Secondary | ICD-10-CM

## 2022-06-19 DIAGNOSIS — G720 Drug-induced myopathy: Secondary | ICD-10-CM

## 2022-06-19 NOTE — Progress Notes (Signed)
Chronic Care Management Pharmacy Note  06/19/2022 Name:  Jeffrey Shaw MRN:  [id number] DOB:  07/04/1951  Summary  Diabetes: New goal. Uncontrolled--patient is currently seeing endocrine for diabetes care; suggested omnipod? Better basal insulin also available  current treatment:levemir, novolog, ozempic;  Will send in/add medications from novo Levemir 10 un AM, 22 units PM Novolog 6-20 units 3 times daily with meals GFR 75-CKD2 reports hypoglycemic/hyperglycemic symptoms Current exercise: works at [employer] patient finances. Submitted for new medications via novo nordisk PAP; patient already enrolled for ozempic   Patient Goals/Self-Care Activities patient will:  - collaborate with provider on medication access solutions   Subjective: Jeffrey Shaw is an 71 y.o. year old male who is a primary patient of Loman Brooklyn, FNP.  The CCM team was consulted for assistance with disease management and care coordination needs.    Engaged with patient by telephone for initial visit in response to provider referral for pharmacy case management and/or care coordination services.   Consent to Services:  The patient was given information about Chronic Care Management services, agreed to services, and gave verbal consent prior to initiation of services.  Please see initial visit note for detailed documentation.   Patient Care Team: Loman Brooklyn, FNP as PCP - General (Family Medicine) Ilean China, RN as Case Manager Harlen Labs, MD as Referring Physician (Optometry) Lavera Guise, [facility] as [facility] (Pharmacist) Jacelyn Pi, MD as Referring Physician (Endocrinology) Cleon Gustin, MD as Consulting Physician (Urology)  Objective:  Lab Results  Component Value Date   CREATININE 1.06 01/24/2022   CREATININE 0.99 11/11/2021   CREATININE 0.94 09/10/2021    Lab Results  Component Value Date   HGBA1C 6.1 (H) 01/24/2022    Last diabetic Eye exam:  Lab Results  Component Value Date/Time   HMDIABEYEEXA No Retinopathy 03/19/2021 12:00 AM    Last diabetic Foot exam: No results found for: "HMDIABFOOTEX"      Component Value Date/Time   CHOL 139 01/24/2022 1015   TRIG 53 01/24/2022 1015   HDL 59 01/24/2022 1015   CHOLHDL 2.4 01/24/2022 1015   CHOLHDL 2.5 10/30/2011 0520   VLDL 10 10/30/2011 0520   LDLCALC 68 01/24/2022 1015       Latest Ref Rng & Units 01/24/2022   10:15 AM 11/11/2021    8:21 AM 07/19/2021   11:04 AM  Hepatic Function  Total Protein 6.0 - 8.5 g/dL 6.8  7.0  6.9   Albumin 3.7 - 4.7 g/dL 4.5  3.6  4.3   AST 0 - 40 IU/L 34  19  27   ALT 0 - 44 IU/L 32  20  27   Alk Phosphatase 44 - 121 IU/L 91  97  65   Total Bilirubin 0.0 - 1.2 mg/dL 1.7  1.6  1.3     Lab Results  Component Value Date/Time   TSH 2.200 01/24/2022 10:15 AM   TSH 0.801 04/16/2021 01:30 PM   FREET4 1.73 01/24/2022 10:15 AM   FREET4 1.66 04/16/2021 01:30 PM       Latest Ref Rng & Units 01/24/2022   10:15 AM 11/11/2021    8:21 AM 09/10/2021    1:48 PM  CBC  WBC 3.4 - 10.8 x10E3/uL 6.4  10.3  7.4   Hemoglobin 13.0 - 17.7 g/dL 13.9  13.9  13.8   Hematocrit 37.5 - 51.0 % 41.3  41.9  40.6   Platelets 150 -  450 x10E3/uL 257  225  223     No results found for: "VD25OH"  Clinical ASCVD: No  The 10-year ASCVD risk score (Arnett DK, et al., 2019) is: 46.5%*   Values used to calculate the score:     Age: 64 years     Sex: Male     Is Non-Hispanic African American: No     Diabetic: Yes     Tobacco smoker: No     Systolic Blood Pressure: 160 mmHg     Is BP treated: Yes     HDL Cholesterol: 46 mg/dL*     Total Cholesterol: 130 mg/dL*     * - Cholesterol units were assumed for this score calculation    Other: (CHADS2VASc if Afib, PHQ9 if depression, MMRC or CAT for COPD, ACT, DEXA)  Social History   Tobacco Use  Smoking Status Former   Packs/day: 1.00   Years: 4.00   Total pack years: 4.00   Types:  Cigarettes   Quit date: 12/22/1968   Years since quitting: 53.5  Smokeless Tobacco Never   BP Readings from Last 3 Encounters:  04/08/22 (!) 155/78  03/03/22 (!) 157/76  01/24/22 (!) 149/69   Pulse Readings from Last 3 Encounters:  04/08/22 64  03/03/22 76  01/24/22 64   Wt Readings from Last 3 Encounters:  06/04/22 230 lb (104.3 kg)  04/08/22 232 lb (105.2 kg)  01/24/22 230 lb 9.6 oz (104.6 kg)    Assessment: Review of patient past medical history, allergies, medications, health status, including review of consultants reports, laboratory and other test data, was performed as part of comprehensive evaluation and provision of chronic care management services.   SDOH:  (Social Determinants of Health) assessments and interventions performed:    CCM Care Plan  Allergies  Allergen Reactions   Morphine And Related Nausea Only    Medications Reviewed Today     Reviewed by Lavera Guise, [facility] (Pharmacist) on 06/19/22 at [phone]  Med List Status: <None>   Medication Order Taking? Sig Documenting Provider Last Dose Status Informant  atorvastatin (LIPITOR) 40 MG tablet [id number] No Take 1 tablet (40 mg total) by mouth daily. Loman Brooklyn, FNP Taking Active   Continuous Blood Gluc Receiver (DEXCOM G6 RECEIVER) DEVI [id number] No 1 Device by Does not apply route daily. Chevis Pretty, FNP Taking Active Self  Continuous Blood Gluc Sensor (DEXCOM G6 SENSOR) MISC [id number] No APPLY 1 SENSOR TOPICALLY EVERY 10 DAYS Loman Brooklyn, FNP Taking Active   Continuous Blood Gluc Transmit (DEXCOM G6 TRANSMITTER) MISC [id number] No USE EVERY 3 MONTHS Loman Brooklyn, FNP Taking Active   famotidine (PEPCID) 20 MG tablet [id number] No Take 20 mg by mouth 2 (two) times daily. [provider] Taking Active Self  insulin aspart (NOVOLOG) 100 UNIT/ML FlexPen [id number] No Inject 8-24 Units into the skin 3 (three) times daily before meals. [provider] Taking Active Self            Med Note Ardith Dark, REBECCA S   Fri Sep 13, 2021  7:10 AM) Injected total of novolog insulin 3 units this AM (09/13/21)   Insulin Detemir (LEVEMIR FLEXTOUCH) 100 UNIT/ML Pen [id number] No Inject 10-25 Units into the skin See admin instructions. 10 units qam, 25 units qhs [provider] Taking Active Self  levothyroxine (SYNTHROID) 175 MCG tablet [id number] No Take 1 tablet (175 mcg total) by mouth daily before breakfast. Loman Brooklyn, FNP Taking Active   linaclotide [facility]) 72 MCG  capsule [id number]  Take 1 capsule (72 mcg total) by mouth daily before breakfast. Loman Brooklyn, FNP  Active   lisinopril (ZESTRIL) 10 MG tablet [id number] No TAKE 1 TABLET BY MOUTH DAILY. [facility] FOR ZESTRIL) Loman Brooklyn, FNP Taking Active   metFORMIN (GLUCOPHAGE-XR) 500 MG 24 hr tablet [id number] No Take 1 tablet (500 mg total) by mouth daily with breakfast. Loman Brooklyn, FNP Taking Active   OVER THE COUNTER MEDICATION [id number] No Take 1 capsule by mouth 2 (two) times daily. 3-6-9 fish oil, flax seed combo [provider] Taking Active Self  OZEMPIC, 1 MG/DOSE, 4 MG/3ML SOPN [id number] No Inject 1 mg as directed every Sunday. [provider] Taking Active Self  sildenafil (VIAGRA) 100 MG tablet [id number] No TAKE 1 TABLET BY MOUTH ONCE DAILY AS NEEDED FOR  ERECTILE  DYSFUNCTION Loman Brooklyn, FNP Taking Active Self  traMADol (ULTRAM) 50 MG tablet [id number] No Take 1 tablet (50 mg total) by mouth every 6 (six) hours as needed.  Patient not taking: Reported on 06/04/2022   Kem Parkinson, PA-C Not Taking Active   Med List Note Loman Brooklyn, [place] 01/27/22 6153): Endo for diabetes medications.             Patient Active Problem List   Diagnosis Date Noted   Left inguinal hernia    Aortic atherosclerosis ([place]) 07/19/2021   Controlled type 2 diabetes mellitus with hyperglycemia, with long-term current use of insulin ([place]) 01/08/2021   Gastroesophageal reflux disease  01/08/2021   Onychomycosis 07/05/2020   Erectile dysfunction due to arterial insufficiency 07/07/2019   Coronary artery disease involving native coronary artery of native heart without angina pectoris    Hypothyroidism 08/02/2009   Mixed hyperlipidemia 08/02/2009   Essential hypertension 08/02/2009    Immunization History  Administered Date(s) Administered   Influenza Split 10/07/2017   Influenza,inj,quad, With Preservative 09/24/2018, 10/10/2019   Influenza-Unspecified 10/13/2008, 10/06/2014, 10/05/2015, 09/24/2016, 09/24/2018, 10/05/2020   Pneumococcal Conjugate-13 10/06/2014   Pneumococcal Polysaccharide-23 10/09/1997, 01/07/2019   Td 09/02/2004   Tdap 03/14/2013   Zoster Recombinat (Shingrix) 09/28/2020, 03/07/2021   Zoster, Live 07/29/2013    Conditions to be addressed/monitored: DMII and CKD Stage 2  Care Plan : PHARMD MEDICATION MANAGEMENT  Updates made by Lavera Guise, RPH since 06/25/2022 12:00 AM     Problem: DISEASE PROGRESSION PREVENTION      Long-Range Goal: T2DM   This Visit's Progress: Not on track  Note:   Current Barriers:  Unable to independently afford treatment regimen  Pharmacist Clinical Goal(s):  patient will verbalize ability to afford treatment regimen through collaboration with PharmD and provider.    Interventions: 1:1 collaboration with Loman Brooklyn, FNP regarding development and update of comprehensive plan of care as evidenced by provider attestation and co-signature Inter-disciplinary care team collaboration (see longitudinal plan of care) Comprehensive medication review performed; medication list updated in electronic medical record  Diabetes: New goal. Uncontrolled--patient is currently seeing endocrine for diabetes care; suggested omnipod? Better basal insulin also available  current treatment:levemir, novolog, ozempic;  Will send in add medications from novo Levemir 10 un AM, 22 units PM Novolog 6-20 units 3 times daily with  meals GFR 75-CKD2 reports hypoglycemic/hyperglycemic symptoms Current exercise: works at [employer] patient finances. Submitted for new medications via novo nordisk PAP; patient already enrolled for ozempic   Patient Goals/Self-Care Activities patient will:  - collaborate with provider on medication access solutions      Medication Assistance: Application for NOVON[employer]ISK  medication assistance program. in process.  Anticipated assistance start date TBD.  See plan of care for additional detail.  Patient's preferred pharmacy is:  [facility] [address], [place] [address] Phone: [phone] Fax: [phone]  [facility] [place], [place] [phone] [place] [address] Phone: [phone] Fax: [phone]  Follow Up:  Patient agrees to Care Plan and Follow-up.  Plan: Telephone follow up appointment with care management team member scheduled for:  3 MONTHS   Regina Eck, PharmD, BCPS Clinical Pharmacist, [place]  II Phone [phone]

## 2022-06-20 DIAGNOSIS — E1165 Type 2 diabetes mellitus with hyperglycemia: Secondary | ICD-10-CM

## 2022-06-20 DIAGNOSIS — Z794 Long term (current) use of insulin: Secondary | ICD-10-CM

## 2022-06-25 NOTE — Patient Instructions (Addendum)
Visit Information  Following are the goals we discussed today:  Current Barriers:  Unable to independently afford treatment regimen  Pharmacist Clinical Goal(s):  patient will verbalize ability to afford treatment regimen through collaboration with PharmD and provider.    Interventions: 1:1 collaboration with Gwenlyn Fudge, FNP regarding development and update of comprehensive plan of care as evidenced by provider attestation and co-signature Inter-disciplinary care team collaboration (see longitudinal plan of care) Comprehensive medication review performed; medication list updated in electronic medical record  Diabetes: New goal. Uncontrolled--patient is currently seeing endocrine for diabetes care; suggested omnipod? 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 Submitted for new medications via novo nordisk PAP; patient already enrolled for ozempic   Patient Goals/Self-Care Activities patient will:  - collaborate with provider on medication access solutions   Plan: Telephone follow up appointment with care management team member scheduled for:  3 MONTHS  Signature Kieth Brightly, PharmD, BCPS Clinical Pharmacist, [facility]  II Phone [phone]   Please call the care guide team at [phone] if you need to cancel or reschedule your appointment.   The patient verbalized understanding of instructions, educational materials, and care plan provided today and DECLINED offer to receive copy of patient instructions, educational materials, and care plan.

## 2022-07-11 ENCOUNTER — Ambulatory Visit (INDEPENDENT_AMBULATORY_CARE_PROVIDER_SITE_OTHER): Payer: Medicare Other | Admitting: Pharmacist

## 2022-07-11 DIAGNOSIS — E782 Mixed hyperlipidemia: Secondary | ICD-10-CM

## 2022-07-11 DIAGNOSIS — E1165 Type 2 diabetes mellitus with hyperglycemia: Secondary | ICD-10-CM

## 2022-07-11 NOTE — Patient Instructions (Incomplete)
Visit Information  Following are the goals we discussed today:  Current Barriers:  Unable to independently afford treatment regimen  Pharmacist Clinical Goal(s):  patient will verbalize ability to afford treatment regimen through collaboration with PharmD and provider.   Interventions: 1:1 collaboration with Gwenlyn Fudge, FNP regarding development and update of comprehensive plan of care as evidenced by provider attestation and co-signature Inter-disciplinary care team collaboration (see longitudinal plan of care) Comprehensive medication review performed; medication list updated in electronic medical record  Diabetes: Goal on Track (progressing): YES. Uncontrolled--patient is currently seeing endocrine for diabetes care; suggested omnipod? Better basal insulin also available  Current treatment: levemir, novolog, ozempic;  OZEMPIC 1MG  WEEKLY Denies personal and family history of Medullary thyroid cancer (MTC) Levemir 10 un AM, 22 units PM Novolog 6-20 units 3 times daily with meals Message sent to CPhT to track shipment GFR 75-CKD2 Reports hypoglycemic/hyperglycemic symptoms Current exercise: works at ; yardwork Assessed patient finances. Submitted for new medications via novo nordisk PAP; patient already enrolled for ozempic LAST SHIPMENT OF OZEMPIC SENT TO HEALTH DEPT 05/23/22 ALL 3 MEDS SHIPMENT PROCESSING AS OF 07/07/22 TO [facility] PT ID 07/09/22 ENROLLED UNTIL 10/04/22 & MAY BEGIN RENEWAL A MONTH BEFORE EXPIRATION.  HLD: -Patient states he has an overstock of statin at home and has been taking it as prescribed -Atorvastatin 40mg  daily  Lipid Panel     Component Value Date/Time   CHOL 139 01/24/2022 1015   TRIG 53 01/24/2022 1015   HDL 59 01/24/2022 1015   CHOLHDL 2.4 01/24/2022 1015   CHOLHDL 2.5 10/30/2011 0520   VLDL 10 10/30/2011 0520   LDLCALC 68 01/24/2022 1015   LABVLDL 12 01/24/2022 1015     Patient Goals/Self-Care Activities patient will:  -  collaborate with provider on medication access solutions   Plan: Telephone follow up appointment with care management team member scheduled for:  3 months  Signature 03/24/2022, PharmD, BCPS Clinical Pharmacist, [facility]  II Phone [phone]   Please call the care guide team at [phone] if you need to cancel or reschedule your appointment.   The patient verbalized understanding of instructions, educational materials, and care plan provided today and DECLINED offer to receive copy of patient instructions, educational materials, and care plan.

## 2022-07-11 NOTE — Progress Notes (Signed)
Chronic Care Management Pharmacy Note  07/11/2022 Name:  Jeffrey Shaw MRN:  [id number] DOB:  [birth year]  Summary:  Diabetes: Goal on Track (progressing): YES. Uncontrolled--patient is currently seeing endocrine for diabetes care; suggested omnipod? 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 Submitted for new medications via novo nordisk PAP; patient already enrolled for ozempic LAST [place] DEPT 05/23/22 ALL 3 MEDS SHIPMENT PROCESSING AS OF 07/07/22 TO [facility] PT ID [id number] ENROLLED UNTIL 10/04/22 & MAY BEGIN RENEWAL A MONTH BEFORE EXPIRATION.  HLD: -Patient states he has an overstock of statin at home and has been taking it as prescribed -Atorvastatin 70m daily  Lipid Panel     Component Value Date/Time   CHOL 139 01/24/2022 1015   TRIG 53 01/24/2022 1015   HDL 59 01/24/2022 1015   CHOLHDL 2.4 01/24/2022 1015   CHOLHDL 2.5 10/30/2011 0520   VLDL 10 10/30/2011 0520   LDLCALC 68 01/24/2022 1015   LABVLDL 12 01/24/2022 1015     Patient Goals/Self-Care Activities patient will:  - collaborate with provider on medication access solutions   Subjective: Jeffrey ELZEYis an 71y.o. year old male who is a primary patient of JLoman Brooklyn FNP.  The CCM team was consulted for assistance with disease management and care coordination needs.    Engaged with patient by telephone for follow up visit in response to provider referral for pharmacy case management and/or care coordination services.   Consent to Services:  The patient was given information about Chronic Care Management  services, agreed to services, and gave verbal consent prior to initiation of services.  Please see initial visit note for detailed documentation.   Patient Care Team: JLoman Brooklyn FNP as PCP - General (Family Medicine) HIlean China RN as Case Manager LHarlen Labs MD as Referring Physician (Optometry) PLavera Guise R[facility] LLCas Pharmacist (Family Medicine) BJacelyn Pi MD as Referring Physician (Endocrinology) MCleon Gustin MD as Consulting Physician (Urology)  Objective:  Lab Results  Component Value Date   CREATININE 1.06 01/24/2022   CREATININE 0.99 11/11/2021   CREATININE 0.94 09/10/2021    Lab Results  Component Value Date   HGBA1C 6.1 (H) 01/24/2022   Last diabetic Eye exam:  Lab Results  Component Value Date/Time   HMDIABEYEEXA No Retinopathy 03/19/2021 12:00 AM    Last diabetic Foot exam: No results found for: "HMDIABFOOTEX"      Component Value Date/Time   CHOL 139 01/24/2022 1015   TRIG 53 01/24/2022 1015   HDL 59 01/24/2022 1015   CHOLHDL 2.4 01/24/2022 1015   CHOLHDL 2.5 10/30/2011 0520   VLDL 10 10/30/2011 0520   LDLCALC 68 01/24/2022 1015       Latest Ref Rng & Units 01/24/2022   10:15 AM 11/11/2021    8:21 AM 07/19/2021   11:04 AM  Hepatic Function  Total Protein 6.0 - 8.5 g/dL 6.8  7.0  6.9   Albumin 3.7 - 4.7 g/dL 4.5  3.6  4.3   AST 0 - 40 IU/L 34  19  27   ALT 0 - 44 IU/L 32  20  27  Alk Phosphatase 44 - 121 IU/L 91  97  65   Total Bilirubin 0.0 - 1.2 mg/dL 1.7  1.6  1.3     Lab Results  Component Value Date/Time   TSH 2.200 01/24/2022 10:15 AM   TSH 0.801 04/16/2021 01:30 PM   FREET4 1.73 01/24/2022 10:15 AM   FREET4 1.66 04/16/2021 01:30 PM       Latest Ref Rng & Units 01/24/2022   10:15 AM 11/11/2021    8:21 AM 09/10/2021    1:48 PM  CBC  WBC 3.4 - 10.8 x10E3/uL 6.4  10.3  7.4   Hemoglobin 13.0 - 17.7 g/dL 13.9  13.9  13.8   Hematocrit 37.5 - 51.0 % 41.3  41.9  40.6   Platelets 150 - 450 x10E3/uL 257  225   223     No results found for: "VD25OH"  Clinical ASCVD: No  The 10-year ASCVD risk score (Arnett DK, et al., 2019) is: 46.5%*   Values used to calculate the score:     Age: 42 years     Sex: Male     Is Non-Hispanic African American: No     Diabetic: Yes     Tobacco smoker: No     Systolic Blood Pressure: 335 mmHg     Is BP treated: Yes     HDL Cholesterol: 46 mg/dL*     Total Cholesterol: 130 mg/dL*     * - Cholesterol units were assumed for this score calculation    Other: (CHADS2VASc if Afib, PHQ9 if depression, MMRC or CAT for COPD, ACT, DEXA)  Social History   Tobacco Use  Smoking Status Former   Packs/day: 1.00   Years: 4.00   Total pack years: 4.00   Types: Cigarettes   Quit date: 12/22/1968   Years since quitting: 53.5  Smokeless Tobacco Never   BP Readings from Last 3 Encounters:  04/08/22 (!) 155/78  03/03/22 (!) 157/76  01/24/22 (!) 149/69   Pulse Readings from Last 3 Encounters:  04/08/22 64  03/03/22 76  01/24/22 64   Wt Readings from Last 3 Encounters:  06/04/22 230 lb (104.3 kg)  04/08/22 232 lb (105.2 kg)  01/24/22 230 lb 9.6 oz (104.6 kg)    Assessment: Review of patient past medical history, allergies, medications, health status, including review of consultants reports, laboratory and other test data, was performed as part of comprehensive evaluation and provision of chronic care management services.   SDOH:  (Social Determinants of Health) assessments and interventions performed:    CCM Care Plan  Allergies  Allergen Reactions   Morphine And Related Nausea Only    Medications Reviewed Today     Reviewed by Lavera Guise, [facility] (Pharmacist) on 07/15/22 at 1343  Med List Status: <None>   Medication Order Taking? Sig Documenting Provider Last Dose Status Informant  atorvastatin (LIPITOR) 40 MG tablet [id number] No Take 1 tablet (40 mg total) by mouth daily. Loman Brooklyn, FNP Taking Active   Continuous Blood Gluc Receiver (DEXCOM G6  RECEIVER) DEVI [id number] No 1 Device by Does not apply route daily. Chevis Pretty, FNP Taking Active Self  Continuous Blood Gluc Sensor (DEXCOM G6 SENSOR) MISC [id number] No APPLY 1 SENSOR TOPICALLY EVERY 10 DAYS Loman Brooklyn, FNP Taking Active   Continuous Blood Gluc Transmit (DEXCOM G6 TRANSMITTER) MISC [id number] No USE EVERY 3 MONTHS Loman Brooklyn, FNP Taking Active   famotidine (PEPCID) 20 MG tablet [id number] No Take 20 mg by mouth 2 (two)  times daily. [provider] Taking Active Self  insulin aspart (NOVOLOG) 100 UNIT/ML FlexPen [id number] No Inject 8-24 Units into the skin 3 (three) times daily before meals. [provider] Taking Active Self           Med Note Blanca Friend, Royce Macadamia   Tue Jul 15, 2022  1:43 PM) Via novo nordisk patient assistance program    Insulin Detemir (LEVEMIR FLEXTOUCH) 100 UNIT/ML Pen [id number] No Inject 10-25 Units into the skin See admin instructions. 10 units qam, 25 units qhs [provider] Taking Active Self           Med Note Blanca Friend, Dadrian Ballantine D   Tue Jul 15, 2022  1:43 PM) Via novo nordisk patient assistance program    levothyroxine (SYNTHROID) 175 MCG tablet [id number] No Take 1 tablet (175 mcg total) by mouth daily before breakfast. Loman Brooklyn, FNP Taking Active   linaclotide [facility]) 72 MCG capsule [id number]  Take 1 capsule (72 mcg total) by mouth daily before breakfast. Loman Brooklyn, FNP  Active   lisinopril (ZESTRIL) 10 MG tablet [id number] No TAKE 1 TABLET BY MOUTH DAILY. [facility] FOR ZESTRIL) Loman Brooklyn, FNP Taking Active   metFORMIN (GLUCOPHAGE-XR) 500 MG 24 hr tablet [id number] No Take 1 tablet (500 mg total) by mouth daily with breakfast. Loman Brooklyn, FNP Taking Active   OVER THE COUNTER MEDICATION [id number] No Take 1 capsule by mouth 2 (two) times daily. 3-6-9 fish oil, flax seed combo [provider] Taking Active Self  OZEMPIC, 1 MG/DOSE, 4 MG/3ML SOPN [id number] No Inject 1 mg as  directed every Sunday. [provider] Taking Active Self           Med Note Blanca Friend, Iven Finn Jul 15, 2022  1:42 PM) Via novo nordisk patient assistance program    sildenafil (VIAGRA) 100 MG tablet [id number] No TAKE 1 TABLET BY MOUTH ONCE DAILY AS NEEDED FOR  ERECTILE  DYSFUNCTION Loman Brooklyn, FNP Taking Active Self  Patient not taking:  Discontinued 07/15/22 1342 (Completed Course)   Med List Note Loman Brooklyn, [place] 01/27/22 2800): Endo for diabetes medications.             Patient Active Problem List   Diagnosis Date Noted   Left inguinal hernia    Aortic atherosclerosis ([place]) 07/19/2021   Controlled type 2 diabetes mellitus with hyperglycemia, with long-term current use of insulin ([place]) 01/08/2021   Gastroesophageal reflux disease 01/08/2021   Onychomycosis 07/05/2020   Erectile dysfunction due to arterial insufficiency 07/07/2019   Coronary artery disease involving native coronary artery of native heart without angina pectoris    Hypothyroidism 08/02/2009   Mixed hyperlipidemia 08/02/2009   Essential hypertension 08/02/2009    Immunization History  Administered Date(s) Administered   Influenza Split 10/07/2017   Influenza,inj,quad, With Preservative 09/24/2018, 10/10/2019   Influenza-Unspecified 10/13/2008, 10/06/2014, 10/05/2015, 09/24/2016, 09/24/2018, 10/05/2020   Pneumococcal Conjugate-13 10/06/2014   Pneumococcal Polysaccharide-23 10/09/1997, 01/07/2019   Td 09/02/2004   Tdap 03/14/2013   Zoster Recombinat (Shingrix) 09/28/2020, 03/07/2021   Zoster, Live 07/29/2013    Conditions to be addressed/monitored: HLD and DMII  Care Plan : PHARMD MEDICATION MANAGEMENT  Updates made by Lavera Guise, [place] since 07/15/2022 12:00 AM     Problem: DISEASE PROGRESSION PREVENTION      Long-Range Goal: T2DM   Recent Progress: Not on track  Note:   Current Barriers:  Unable to independently afford treatment regimen  Pharmacist Clinical  Goal(s):  patient will verbalize ability to afford treatment regimen through collaboration with PharmD and provider.   Interventions: 1:1 collaboration with Loman Brooklyn, FNP regarding development and update of comprehensive plan of care as evidenced by provider attestation and co-signature Inter-disciplinary care team collaboration (see longitudinal plan of care) Comprehensive medication review performed; medication list updated in electronic medical record  Diabetes: Goal on Track (progressing): YES. Uncontrolled--patient is currently seeing endocrine for diabetes care; suggested omnipod? 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 Submitted for new medications via novo nordisk PAP; patient already enrolled for ozempic LAST [place] DEPT 05/23/22 ALL 3 MEDS SHIPMENT PROCESSING AS OF 07/07/22 TO [facility] PT ID [id number] ENROLLED UNTIL 10/04/22 & MAY BEGIN RENEWAL A MONTH BEFORE EXPIRATION.  HLD: -Patient states he has an overstock of statin at home and has been taking it as prescribed -Atorvastatin 64m daily  Lipid Panel     Component Value Date/Time   CHOL 139 01/24/2022 1015   TRIG 53 01/24/2022 1015   HDL 59 01/24/2022 1015   CHOLHDL 2.4 01/24/2022 1015   CHOLHDL 2.5 10/30/2011 0520   VLDL 10 10/30/2011 0520   LDLCALC 68 01/24/2022 1015   LABVLDL 12 01/24/2022 1015    Patient Goals/Self-Care Activities patient will:  - collaborate with provider on medication access solutions      Medication Assistance: Application for novo nordisk x3 meds  medication assistance program. in  process.  Anticipated assistance start date tbd.  See plan of care for additional detail.  Follow Up:  Patient agrees to Care Plan and Follow-up.  Plan: Telephone follow up appointment with care management team member scheduled for:  08/2022    JRegina Eck PharmD, BCPS Clinical Pharmacist, [place] Phone [phone]

## 2022-07-21 DIAGNOSIS — E1165 Type 2 diabetes mellitus with hyperglycemia: Secondary | ICD-10-CM

## 2022-07-21 DIAGNOSIS — Z794 Long term (current) use of insulin: Secondary | ICD-10-CM | POA: Diagnosis not present

## 2022-07-21 DIAGNOSIS — E782 Mixed hyperlipidemia: Secondary | ICD-10-CM

## 2022-07-28 ENCOUNTER — Ambulatory Visit: Payer: Self-pay | Admitting: *Deleted

## 2022-07-28 NOTE — Chronic Care Management (AMB) (Signed)
  Chronic Care Management   Note  07/28/2022 Name: Jeffrey Shaw MRN: [id number] DOB: Aug 05, 1951   Due to changes in the Chronic Care Management program, I am removing myself as the RN Care Manager from the Care Team. There are no open RN Care Plans. Patient was not scheduled to be followed by the RN Care Coordination nurse for [facility].   Patient has an open Care Plan with another CCM team member, Vanice Sarah, PharmD.  I will forward this case closure encounter to the other team member(s). Patient does have a current CCM referral.  Demetrios Loll, BSN, RN-BC RN Care Coordinator Direct Dial: [phone]

## 2022-07-28 NOTE — Patient Instructions (Signed)
[name]  At some point during the past 4 years, I was listed on your Care Team for Chronic Care Management Program at [facility].  Due to program changes and no active RN Care plans, I am removing myself from your Care Team. You will continue to work with Vanice Sarah, PharmD.  If you have any questions, please let me know.  Demetrios Loll, BSN, RN-BC Engineer, materials Dial: [phone]

## 2022-08-06 ENCOUNTER — Encounter: Payer: Self-pay | Admitting: Family Medicine

## 2022-08-06 ENCOUNTER — Ambulatory Visit (INDEPENDENT_AMBULATORY_CARE_PROVIDER_SITE_OTHER): Payer: Medicare Other | Admitting: Family Medicine

## 2022-08-06 VITALS — BP 105/61 | HR 72 | Temp 97.6°F | Ht 76.0 in | Wt 230.6 lb

## 2022-08-06 DIAGNOSIS — E1165 Type 2 diabetes mellitus with hyperglycemia: Secondary | ICD-10-CM

## 2022-08-06 DIAGNOSIS — E782 Mixed hyperlipidemia: Secondary | ICD-10-CM | POA: Diagnosis not present

## 2022-08-06 DIAGNOSIS — I10 Essential (primary) hypertension: Secondary | ICD-10-CM | POA: Diagnosis not present

## 2022-08-06 DIAGNOSIS — Z794 Long term (current) use of insulin: Secondary | ICD-10-CM | POA: Diagnosis not present

## 2022-08-06 DIAGNOSIS — I2510 Atherosclerotic heart disease of native coronary artery without angina pectoris: Secondary | ICD-10-CM

## 2022-08-06 DIAGNOSIS — E039 Hypothyroidism, unspecified: Secondary | ICD-10-CM | POA: Diagnosis not present

## 2022-08-06 DIAGNOSIS — I700 Atherosclerosis of aorta: Secondary | ICD-10-CM

## 2022-08-06 DIAGNOSIS — K219 Gastro-esophageal reflux disease without esophagitis: Secondary | ICD-10-CM

## 2022-08-06 LAB — BAYER DCA HB A1C WAIVED: HB A1C (BAYER DCA - WAIVED): 6.5 % — ABNORMAL HIGH (ref 4.8–5.6)

## 2022-08-06 NOTE — Progress Notes (Signed)
Assessment & Plan:  1. Controlled type 2 diabetes mellitus with hyperglycemia, with long-term current use of insulin (HCC) Lab Results  Component Value Date   HGBA1C 6.5 (H) 08/06/2022   HGBA1C 6.1 (H) 01/24/2022   HGBA1C 5.8 (H) 09/10/2021    - Diabetes is at goal of A1c < 7. - Medications: continue current medications - Home glucose monitoring: continue using DEXCOM - Patient is currently taking a statin. Patient is taking an ACE-inhibitor/ARB.   Diabetes Health Maintenance Due  Topic Date Due   OPHTHALMOLOGY EXAM  03/19/2022   FOOT EXAM  07/19/2022   HEMOGLOBIN A1C  07/24/2022    Lab Results  Component Value Date   LABMICR See below: 03/03/2022   LABMICR Comment 08/14/2021   - CBC with Differential/Platelet - CMP14+EGFR - Lipid panel - Bayer DCA Hb A1c Waived  2. Essential hypertension Well controlled on current regimen.  - CBC with Differential/Platelet - CMP14+EGFR - Lipid panel  3. Mixed hyperlipidemia Well controlled on current regimen.  - CBC with Differential/Platelet - CMP14+EGFR - Lipid panel  4. Aortic atherosclerosis (HCC) Continue atorvastatin. Start aspirin.  - aspirin EC 81 MG tablet; Take 81 mg by mouth daily. Swallow whole. - Lipid panel  5. Coronary artery disease involving native coronary artery of native heart without angina pectoris Continue atorvastatin. Start aspirin.  - aspirin EC 81 MG tablet; Take 81 mg by mouth daily. Swallow whole. - Lipid panel  6. Acquired hypothyroidism Well controlled on current regimen.  - TSH - T4, free  7. Gastroesophageal reflux disease, unspecified whether esophagitis present Well controlled on current regimen.  - CMP14+EGFR   Return in about 6 months (around 02/06/2023) for follow-up of chronic medication conditions.  [name], MSN, APRN, FNP-C [facility]  Subjective:    Patient ID: Jeffrey Shaw, male    DOB: 16-Feb-1951, 71 y.o.   MRN: [id number]  Patient Care  Team: Gwenlyn Fudge, FNP as PCP - General (Family Medicine) Michaelle Copas, MD as Referring Physician (Optometry) Cresenciano Genre, Lilla Shook, [facility] as Pharmacist (Family Medicine) Dorisann Frames, MD as Referring Physician (Endocrinology) Ronne Binning Mardene Celeste, MD as Consulting Physician (Urology)   Chief Complaint:  Chief Complaint  Patient presents with   Medical Management of Chronic Issues    HPI: Jeffrey Shaw is a 71 y.o. male presenting on 08/06/2022 for Medical Management of Chronic Issues  Diabetes: Current symptoms include: hyperglycemia and hypoglycemia . Known diabetic complications: cardiovascular disease. Medication compliance: yes. Current diet: in general, a "healthy" diet  . Current exercise: aerobics, bicycling, cardiovascular workout on exercise equipment, swimming, walking and weightlifting. He lifes weights 3x/week and walks for 45 minutes five days/week. Home blood sugar records:  has the [facility].  Is he  on ACE inhibitor or angiotensin II receptor blocker? Yes (Lisinopril). Is he on a statin? Yes (Atorvastatin). He is now being managed by endocrinology.  Hypothyroidism: currently taking 175 mcg. Last dose adjustment in January 2022.   GERD: Patient takes Protonix and/or famotidine as needed, which works well for him.   Hyperlipidemia/CAD/Aortic atherosclerosis: taking atorvastatin and a fish oil/flaxseed combination pill daily. He is not taking an aspirin.  Constipation: controlled with Linzess and Miralax as needed.  New complaints: None   Social history:  Relevant past medical, surgical, family and social history reviewed and updated as indicated. Interim medical history since our last visit reviewed.  Allergies and medications reviewed and updated.  DATA REVIEWED: CHART IN EPIC  ROS: Negative  unless specifically indicated above in HPI.    Current Outpatient Medications:    atorvastatin (LIPITOR) 40 MG tablet, Take 1 tablet (40 mg total) by mouth daily.,  Disp: 90 tablet, Rfl: 2   Continuous Blood Gluc Receiver (DEXCOM G6 RECEIVER) DEVI, 1 Device by Does not apply route daily., Disp: 1 each, Rfl: 0   Continuous Blood Gluc Sensor (DEXCOM G6 SENSOR) MISC, APPLY 1 SENSOR TOPICALLY EVERY 10 DAYS, Disp: 3 each, Rfl: 3   Continuous Blood Gluc Transmit (DEXCOM G6 TRANSMITTER) MISC, USE EVERY 3 MONTHS, Disp: 2 each, Rfl: 1   famotidine (PEPCID) 20 MG tablet, Take 20 mg by mouth 2 (two) times daily., Disp: , Rfl:    insulin aspart (NOVOLOG) 100 UNIT/ML FlexPen, Inject 8-24 Units into the skin 3 (three) times daily before meals., Disp: , Rfl:    Insulin Detemir (LEVEMIR FLEXTOUCH) 100 UNIT/ML Pen, Inject 10-25 Units into the skin See admin instructions. 10 units qam, 25 units qhs, Disp: , Rfl:    levothyroxine (SYNTHROID) 175 MCG tablet, Take 1 tablet (175 mcg total) by mouth daily before breakfast., Disp: 90 tablet, Rfl: 2   linaclotide (LINZESS) 72 MCG capsule, Take 1 capsule (72 mcg total) by mouth daily before breakfast., Disp: 90 capsule, Rfl: 1   lisinopril (ZESTRIL) 10 MG tablet, TAKE 1 TABLET BY MOUTH DAILY. (GENERIC FOR ZESTRIL), Disp: 90 tablet, Rfl: 2   metFORMIN (GLUCOPHAGE-XR) 500 MG 24 hr tablet, Take 1 tablet (500 mg total) by mouth daily with breakfast., Disp: 90 tablet, Rfl: 1   OVER THE COUNTER MEDICATION, Take 1 capsule by mouth 2 (two) times daily. 3-6-9 fish oil, flax seed combo, Disp: , Rfl:    OZEMPIC, 1 MG/DOSE, 4 MG/3ML SOPN, Inject 1 mg as directed every Sunday., Disp: , Rfl:    sildenafil (VIAGRA) 100 MG tablet, TAKE 1 TABLET BY MOUTH ONCE DAILY AS NEEDED FOR  ERECTILE  DYSFUNCTION, Disp: 30 tablet, Rfl: 2  Allergies  Allergen Reactions   Morphine And Related Nausea Only   Past Medical History:  Diagnosis Date   Allergy morphine   Arthritis    CAD (coronary artery disease)    CABG 01/2008 /  Catheterization, November, 2012, LIMA to the LAD patent,  70% distal left main stenosis involving the proximal circumflex, DES to the  left main continuing into the circumflex   CORONARY ARTERY BYPASS GRAFT, HX OF 01/17/2010   Qualifier: Diagnosis of  By: Ron Parker, MD, [facility], [facility]    Diabetes mellitus    Dr. Tod Persia, [facility]   GERD (gastroesophageal reflux disease)    HTN (hypertension)    Hx of CABG    2009, LIMA to LAD, right radial to PDA, SVG to diagonal, SVG to OM1   Hyperlipemia    Hypothyroid    Primary osteoarthritis of left hip 04/18/2016    Past Surgical History:  Procedure Laterality Date   BACK SURGERY     CARPAL TUNNEL RELEASE     left hand   CORONARY ANGIOPLASTY     CORONARY ARTERY BYPASS GRAFT  01/2008   X 6   INGUINAL HERNIA REPAIR     right   INGUINAL HERNIA REPAIR Left 09/13/2021   Procedure: HERNIA REPAIR INGUINAL ADULT W/MESH;  Surgeon: Aviva Signs, MD;  Location: [facility];  Service: General;  Laterality: Left;   JOINT REPLACEMENT  hip   LEFT HEART CATHETERIZATION WITH CORONARY/GRAFT ANGIOGRAM N/A 10/30/2011   Procedure: LEFT HEART CATHETERIZATION WITH Beatrix Fetters;  Surgeon: Hillary Bow, MD;  Location: [facility] CATH LAB;  Service: Cardiovascular;  Laterality: N/A;   LUMBAR DISC SURGERY     removed   PERCUTANEOUS CORONARY STENT INTERVENTION (PCI-S) N/A 11/03/2011   Procedure: PERCUTANEOUS CORONARY STENT INTERVENTION (PCI-S);  Surgeon: Burnell Blanks, MD;  Location: [facility] CATH LAB;  Service: Cardiovascular;  Laterality: N/A;   TONSILLECTOMY  ~ [place] Left 04/18/2016   Procedure: TOTAL HIP ARTHROPLASTY;  Surgeon: Earlie Server, MD;  Location: [facility];  Service: Orthopedics;  Laterality: Left;   UMBILICAL HERNIA REPAIR N/A 09/13/2021   Procedure: HERNIA REPAIR UMBILICAL ADULT W/MESH;  Surgeon: Aviva Signs, MD;  Location: [facility];  Service: General;  Laterality: N/A;   URETEROLITHOTOMY Left    with stone basket removal    Social History   Socioeconomic History   Marital status: Married    Spouse name: SANDRA   Number of children: 0   Years of  education: Not on file   Highest education level: 12th grade  Occupational History   Occupation: SELF EMPLOYED    Comment: retired   Occupation: Armed forces operational officer: [employer]    Comment: 5 hours/ 5 days per week  Tobacco Use   Smoking status: Former    Packs/day: 1.00    Years: 4.00    Total pack years: 4.00    Types: Cigarettes    Quit date: 12/22/1968    Years since quitting: 53.6   Smokeless tobacco: Never  Vaping Use   Vaping Use: Never used  Substance and Sexual Activity   Alcohol use: Yes    Comment: maybe one beer a month   Drug use: No   Sexual activity: Yes    Birth control/protection: None  Other Topics Concern   Not on file  Social History Narrative   Lives in one level home with his wife.   Very active - works at [employer], plays basketball, walks daily   Social Determinants of Health   Financial Resource Strain: Low Risk  (06/04/2022)   Overall Financial Resource Strain (CARDIA)    Difficulty of Paying Living Expenses: Not hard at all  Food Insecurity: No Food Insecurity (06/04/2022)   Hunger Vital Sign    Worried About Running Out of Food in the Last Year: Never true    [place] in the Last Year: Never true  Transportation Needs: No Transportation Needs (06/04/2022)   PRAPARE - Hydrologist (Medical): No    Lack of Transportation (Non-Medical): No  Physical Activity: Sufficiently Active (06/04/2022)   Exercise Vital Sign    Days of Exercise per Week: 5 days    Minutes of Exercise per Session: 60 min  Stress: No Stress Concern Present (06/04/2022)   [place]    Feeling of Stress : Not at all  Social Connections: Moderately Integrated (06/04/2022)   Social Connection and Isolation Panel [NHANES]    Frequency of Communication with Friends and Family: Three times a week    Frequency of Social Gatherings with Friends and Family: Twice a week    Attends  Religious Services: Never    Marine scientist or Organizations: Yes    Attends Music therapist: More than 4 times per year    Marital Status: Married  Human resources officer Violence: Not At Risk (06/04/2022)   Humiliation, Afraid, Rape, and Kick questionnaire    Fear of Current or Ex-Partner: No  Emotionally Abused: No    Physically Abused: No    Sexually Abused: No        Objective:    BP 105/61   Pulse 72   Temp 97.6 F (36.4 C) (Temporal)   Ht $R'6\' 4"'bb$  (1.93 m)   Wt 230 lb 9.6 oz (104.6 kg)   SpO2 97%   BMI 28.07 kg/m   Wt Readings from Last 3 Encounters:  08/06/22 230 lb 9.6 oz (104.6 kg)  06/04/22 230 lb (104.3 kg)  04/08/22 232 lb (105.2 kg)    Physical Exam Vitals reviewed.  Constitutional:      General: He is not in acute distress.    Appearance: Normal appearance. He is overweight. He is not ill-appearing, toxic-appearing or diaphoretic.  HENT:     Head: Normocephalic and atraumatic.  Eyes:     General: No scleral icterus.       Right eye: No discharge.        Left eye: No discharge.     Conjunctiva/sclera: Conjunctivae normal.  Cardiovascular:     Rate and Rhythm: Normal rate and regular rhythm.     Heart sounds: Normal heart sounds. No murmur heard.    No friction rub. No gallop.  Pulmonary:     Effort: Pulmonary effort is normal. No respiratory distress.     Breath sounds: Normal breath sounds. No stridor. No wheezing, rhonchi or rales.  Musculoskeletal:        General: Normal range of motion.     Cervical back: Normal range of motion.  Skin:    General: Skin is warm and dry.  Neurological:     Mental Status: He is alert and oriented to person, place, and time. Mental status is at baseline.  Psychiatric:        Mood and Affect: Mood normal.        Behavior: Behavior normal.        Thought Content: Thought content normal.        Judgment: Judgment normal.    Lab Results  Component Value Date   TSH 2.200 01/24/2022   Lab  Results  Component Value Date   WBC 6.4 01/24/2022   HGB 13.9 01/24/2022   HCT 41.3 01/24/2022   MCV 91 01/24/2022   PLT 257 01/24/2022   Lab Results  Component Value Date   NA 136 01/24/2022   K 5.5 (H) 01/24/2022   CO2 20 01/24/2022   GLUCOSE 152 (H) 01/24/2022   BUN 20 01/24/2022   CREATININE 1.06 01/24/2022   BILITOT 1.7 (H) 01/24/2022   ALKPHOS 91 01/24/2022   AST 34 01/24/2022   ALT 32 01/24/2022   PROT 6.8 01/24/2022   ALBUMIN 4.5 01/24/2022   CALCIUM 10.0 01/24/2022   ANIONGAP 7 11/11/2021   EGFR 75 01/24/2022   Lab Results  Component Value Date   CHOL 139 01/24/2022   Lab Results  Component Value Date   HDL 59 01/24/2022   Lab Results  Component Value Date   LDLCALC 68 01/24/2022   Lab Results  Component Value Date   TRIG 53 01/24/2022   Lab Results  Component Value Date   CHOLHDL 2.4 01/24/2022   Lab Results  Component Value Date   HGBA1C 6.1 (H) 01/24/2022

## 2022-08-07 LAB — CMP14+EGFR
ALT: 25 IU/L (ref 0–44)
AST: 26 IU/L (ref 0–40)
Albumin/Globulin Ratio: 1.8 (ref 1.2–2.2)
Albumin: 4.5 g/dL (ref 3.8–4.8)
Alkaline Phosphatase: 84 IU/L (ref 44–121)
BUN/Creatinine Ratio: 15 (ref 10–24)
BUN: 18 mg/dL (ref 8–27)
Bilirubin Total: 1.8 mg/dL — ABNORMAL HIGH (ref 0.0–1.2)
CO2: 23 mmol/L (ref 20–29)
Calcium: 10 mg/dL (ref 8.6–10.2)
Chloride: 102 mmol/L (ref 96–106)
Creatinine, Ser: 1.23 mg/dL (ref 0.76–1.27)
Globulin, Total: 2.5 g/dL (ref 1.5–4.5)
Glucose: 123 mg/dL — ABNORMAL HIGH (ref 70–99)
Potassium: 5.4 mmol/L — ABNORMAL HIGH (ref 3.5–5.2)
Sodium: 139 mmol/L (ref 134–144)
Total Protein: 7 g/dL (ref 6.0–8.5)
eGFR: 62 mL/min/{1.73_m2} (ref >59)

## 2022-08-07 LAB — CBC WITH DIFFERENTIAL/PLATELET
Basophils Absolute: 0.1 10*3/uL (ref 0.0–0.2)
Basos: 1 %
EOS (ABSOLUTE): 0.2 10*3/uL (ref 0.0–0.4)
Eos: 3 %
Hematocrit: 44.2 % (ref 37.5–51.0)
Hemoglobin: 15 g/dL (ref 13.0–17.7)
Immature Grans (Abs): 0 10*3/uL (ref 0.0–0.1)
Immature Granulocytes: 0 %
Lymphocytes Absolute: 2.4 10*3/uL (ref 0.7–3.1)
Lymphs: 31 %
MCH: 31.5 pg (ref 26.6–33.0)
MCHC: 33.9 g/dL (ref 31.5–35.7)
MCV: 93 fL (ref 79–97)
Monocytes Absolute: 0.5 10*3/uL (ref 0.1–0.9)
Monocytes: 7 %
Neutrophils Absolute: 4.5 10*3/uL (ref 1.4–7.0)
Neutrophils: 58 %
Platelets: 245 10*3/uL (ref 150–450)
RBC: 4.76 x10E6/uL (ref 4.14–5.80)
RDW: 12.1 % (ref 11.6–15.4)
WBC: 7.7 10*3/uL (ref 3.4–10.8)

## 2022-08-07 LAB — LIPID PANEL
Chol/HDL Ratio: 2.4 ratio (ref 0.0–5.0)
Cholesterol, Total: 133 mg/dL (ref 100–199)
HDL: 56 mg/dL (ref >39)
LDL Chol Calc (NIH): 61 mg/dL (ref 0–99)
Triglycerides: 82 mg/dL (ref 0–149)
VLDL Cholesterol Cal: 16 mg/dL (ref 5–40)

## 2022-08-07 LAB — TSH: TSH: 1.63 u[IU]/mL (ref 0.450–4.500)

## 2022-08-07 LAB — T4, FREE: Free T4: 1.74 ng/dL (ref 0.82–1.77)

## 2022-08-08 DIAGNOSIS — H269 Unspecified cataract: Secondary | ICD-10-CM | POA: Diagnosis not present

## 2022-08-08 DIAGNOSIS — I2510 Atherosclerotic heart disease of native coronary artery without angina pectoris: Secondary | ICD-10-CM | POA: Diagnosis not present

## 2022-08-08 DIAGNOSIS — H25812 Combined forms of age-related cataract, left eye: Secondary | ICD-10-CM | POA: Diagnosis not present

## 2022-08-18 ENCOUNTER — Other Ambulatory Visit: Payer: Self-pay | Admitting: Family Medicine

## 2022-08-18 DIAGNOSIS — E1165 Type 2 diabetes mellitus with hyperglycemia: Secondary | ICD-10-CM

## 2022-08-26 DIAGNOSIS — E139 Other specified diabetes mellitus without complications: Secondary | ICD-10-CM | POA: Diagnosis not present

## 2022-08-26 DIAGNOSIS — E7800 Pure hypercholesterolemia, unspecified: Secondary | ICD-10-CM | POA: Diagnosis not present

## 2022-08-26 DIAGNOSIS — E039 Hypothyroidism, unspecified: Secondary | ICD-10-CM | POA: Diagnosis not present

## 2022-08-26 DIAGNOSIS — I10 Essential (primary) hypertension: Secondary | ICD-10-CM | POA: Diagnosis not present

## 2022-08-29 DIAGNOSIS — H269 Unspecified cataract: Secondary | ICD-10-CM | POA: Diagnosis not present

## 2022-08-29 DIAGNOSIS — H25811 Combined forms of age-related cataract, right eye: Secondary | ICD-10-CM | POA: Diagnosis not present

## 2022-09-03 ENCOUNTER — Other Ambulatory Visit: Payer: Medicare Other

## 2022-09-03 DIAGNOSIS — R972 Elevated prostate specific antigen [PSA]: Secondary | ICD-10-CM | POA: Diagnosis not present

## 2022-09-04 ENCOUNTER — Ambulatory Visit: Payer: Medicare Other | Admitting: Pharmacist

## 2022-09-04 LAB — PSA: Prostate Specific Ag, Serum: 11.3 ng/mL — ABNORMAL HIGH (ref 0.0–4.0)

## 2022-09-05 ENCOUNTER — Encounter: Payer: Self-pay | Admitting: Urology

## 2022-09-05 ENCOUNTER — Telehealth: Payer: Self-pay | Admitting: *Deleted

## 2022-09-05 ENCOUNTER — Ambulatory Visit: Payer: Medicare Other | Admitting: Urology

## 2022-09-05 VITALS — BP 132/72 | HR 67

## 2022-09-05 DIAGNOSIS — R972 Elevated prostate specific antigen [PSA]: Secondary | ICD-10-CM

## 2022-09-05 LAB — URINALYSIS, ROUTINE W REFLEX MICROSCOPIC
Bilirubin, UA: NEGATIVE
Glucose, UA: NEGATIVE
Ketones, UA: NEGATIVE
Leukocytes,UA: NEGATIVE
Nitrite, UA: NEGATIVE
Protein,UA: NEGATIVE
RBC, UA: NEGATIVE
Specific Gravity, UA: 1.02 (ref 1.005–1.030)
Urobilinogen, Ur: 0.2 mg/dL (ref 0.2–1.0)
pH, UA: 6 (ref 5.0–7.5)

## 2022-09-05 NOTE — Patient Instructions (Signed)

## 2022-09-05 NOTE — Progress Notes (Signed)
09/05/2022 11:24 AM   Jeffrey Shaw 12/23/1950 [id number]  Referring provider: Gwenlyn Fudge, FNP [address] [place],  [place] [address]  Followup elevated PSA   HPI: Jeffrey Shaw is a 71yo here for followup for elevated PSA. PSA increased to 11.3 from 11.0. He had a negative biopsy 1 year ago. IPSS 3 QOL 0.    PMH: Past Medical History:  Diagnosis Date   Allergy morphine   Arthritis    CAD (coronary artery disease)    CABG 01/2008 /  Catheterization, November, 2012, LIMA to the LAD patent,  70% distal left main stenosis involving the proximal circumflex, DES to the left main continuing into the circumflex   CORONARY ARTERY BYPASS GRAFT, HX OF 01/17/2010   Qualifier: Diagnosis of  By: Myrtis Ser, MD, [facility], [place] [place]    Diabetes mellitus    Dr. Nonie Hoyer, [facility]   GERD (gastroesophageal reflux disease)    HTN (hypertension)    Hx of CABG    2009, LIMA to LAD, right radial to PDA, SVG to diagonal, SVG to OM1   Hyperlipemia    Hypothyroid    Primary osteoarthritis of left hip 04/18/2016    Surgical History: Past Surgical History:  Procedure Laterality Date   BACK SURGERY     CARPAL TUNNEL RELEASE     left hand   CORONARY ANGIOPLASTY     CORONARY ARTERY BYPASS GRAFT  01/2008   X 6   INGUINAL HERNIA REPAIR     right   INGUINAL HERNIA REPAIR Left 09/13/2021   Procedure: HERNIA REPAIR INGUINAL ADULT W/MESH;  Surgeon: [name], MD;  Location: [facility];  Service: General;  Laterality: Left;   JOINT REPLACEMENT  hip   LEFT HEART CATHETERIZATION WITH CORONARY/GRAFT ANGIOGRAM N/A 10/30/2011   Procedure: LEFT HEART CATHETERIZATION WITH Isabel Caprice;  Surgeon: Herby Abraham, MD;  Location: [facility] CATH LAB;  Service: Cardiovascular;  Laterality: N/A;   LUMBAR DISC SURGERY     removed   PERCUTANEOUS CORONARY STENT INTERVENTION (PCI-S) N/A 11/03/2011   Procedure: PERCUTANEOUS CORONARY STENT INTERVENTION (PCI-S);  Surgeon: Kathleene Hazel, MD;  Location: [facility] CATH  LAB;  Service: Cardiovascular;  Laterality: N/A;   TONSILLECTOMY  ~ 1955   TOTAL HIP ARTHROPLASTY Left 04/18/2016   Procedure: TOTAL HIP ARTHROPLASTY;  Surgeon: Frederico Hamman, MD;  Location: [facility];  Service: Orthopedics;  Laterality: Left;   UMBILICAL HERNIA REPAIR N/A 09/13/2021   Procedure: HERNIA REPAIR UMBILICAL ADULT W/MESH;  Surgeon: [name], MD;  Location: [facility];  Service: General;  Laterality: N/A;   URETEROLITHOTOMY Left    with stone basket removal    Home Medications:  Allergies as of 09/05/2022       Reactions   Morphine And Related Nausea Only        Medication List        Accurate as of September 05, 2022 11:24 AM. If you have any questions, ask your nurse or doctor.          aspirin EC 81 MG tablet Take 81 mg by mouth daily. Swallow whole.   atorvastatin 40 MG tablet Commonly known as: LIPITOR Take 1 tablet (40 mg total) by mouth daily.   Dexcom G6 Receiver Devi 1 Device by Does not apply route daily.   Dexcom G6 Sensor Misc APPLY 1 SENSOR AS DIRECTED EVERY 10 DAYS   Dexcom G6 Transmitter Misc USE EVERY 3 MONTHS   famotidine 20 MG tablet Commonly known as: PEPCID Take 20  mg by mouth 2 (two) times daily.   insulin aspart 100 UNIT/ML FlexPen Commonly known as: NOVOLOG Inject 8-24 Units into the skin 3 (three) times daily before meals.   Levemir FlexTouch 100 UNIT/ML FlexPen Generic drug: insulin detemir Inject 10-25 Units into the skin See admin instructions. 10 units qam, 25 units qhs   levothyroxine 175 MCG tablet Commonly known as: SYNTHROID Take 1 tablet (175 mcg total) by mouth daily before breakfast.   linaclotide 72 MCG capsule Commonly known as: Linzess Take 1 capsule (72 mcg total) by mouth daily before breakfast.   lisinopril 10 MG tablet Commonly known as: ZESTRIL TAKE 1 TABLET BY MOUTH DAILY. (GENERIC FOR ZESTRIL)   metFORMIN 500 MG 24 hr tablet Commonly known as: GLUCOPHAGE-XR Take 1 tablet (500 mg total) by mouth  daily with breakfast.   OVER THE COUNTER MEDICATION Take 1 capsule by mouth 2 (two) times daily. 3-6-9 fish oil, flax seed combo   Ozempic (1 MG/DOSE) 4 MG/3ML Sopn Generic drug: Semaglutide (1 MG/DOSE) Inject 1 mg as directed every Sunday.   sildenafil 100 MG tablet Commonly known as: VIAGRA TAKE 1 TABLET BY MOUTH ONCE DAILY AS NEEDED FOR  ERECTILE  DYSFUNCTION        Allergies:  Allergies  Allergen Reactions   Morphine And Related Nausea Only    Family History: Family History  Problem Relation Age of Onset   Dementia Mother    Liver disease Mother    Suicidality Father    Emphysema Father    Cancer Father    Drug abuse Brother        Clean now   Rheum arthritis Sister    Heart attack Maternal Grandmother    Emphysema Maternal Grandfather    Stroke Maternal Grandfather    Stomach cancer Paternal Grandmother    Diabetes Paternal Aunt    Diabetes Paternal Aunt     Social History:  reports that he quit smoking about 53 years ago. His smoking use included cigarettes. He has a 4.00 pack-year smoking history. He has never used smokeless tobacco. He reports current alcohol use. He reports that he does not use drugs.  ROS: All other review of systems were reviewed and are negative except what is noted above in HPI  Physical Exam: BP 132/72   Pulse 67   Constitutional:  Alert and oriented, No acute distress. HEENT: [place] AT, moist mucus membranes.  Trachea midline, no masses. Cardiovascular: No clubbing, cyanosis, or edema. Respiratory: Normal respiratory effort, no increased work of breathing. GI: Abdomen is soft, nontender, nondistended, no abdominal masses GU: No CVA tenderness.  Lymph: No cervical or inguinal lymphadenopathy. Skin: No rashes, bruises or suspicious lesions. Neurologic: Grossly intact, no focal deficits, moving all 4 extremities. Psychiatric: Normal mood and affect.  Laboratory Data: Lab Results  Component Value Date   WBC 7.7 08/06/2022   HGB  15.0 08/06/2022   HCT 44.2 08/06/2022   MCV 93 08/06/2022   PLT 245 08/06/2022    Lab Results  Component Value Date   CREATININE 1.23 08/06/2022    No results found for: "PSA"  No results found for: "TESTOSTERONE"  Lab Results  Component Value Date   HGBA1C 6.5 (H) 08/06/2022    Urinalysis    Component Value Date/Time   COLORURINE YELLOW 04/07/2016 1008   APPEARANCEUR Clear 03/03/2022 1113   LABSPEC 1.012 04/07/2016 1008   PHURINE 5.5 04/07/2016 1008   GLUCOSEU Negative 03/03/2022 1113   HGBUR NEGATIVE 04/07/2016 1008   BILIRUBINUR  Negative 03/03/2022 1113   KETONESUR NEGATIVE 04/07/2016 1008   PROTEINUR Negative 03/03/2022 1113   PROTEINUR NEGATIVE 04/07/2016 1008   UROBILINOGEN 1.0 01/31/2008 0840   NITRITE Negative 03/03/2022 1113   NITRITE NEGATIVE 04/07/2016 1008   LEUKOCYTESUR Negative 03/03/2022 1113    Lab Results  Component Value Date   LABMICR See below: 03/03/2022   WBCUA None seen 03/03/2022   LABEPIT None seen 03/03/2022   BACTERIA None seen 03/03/2022    Pertinent Imaging:  Results for orders placed during the hospital encounter of 05/27/21  DG Abd 1 View  Narrative CLINICAL DATA:  LEFT lower quadrant abdominal pain, on and off constipation and diarrhea  EXAM: ABDOMEN - 1 VIEW  COMPARISON:  None  FINDINGS: Normal bowel gas pattern.  No bowel dilatation or bowel wall thickening.  Degenerative disc disease changes thoracolumbar spine with minimal levoconvex scoliosis.  LEFT hip prosthesis.  No urinary tract calcification.  Lung bases clear.  IMPRESSION: Normal bowel gas pattern.   Electronically Signed By: Ulyses Southward M.D. On: 05/28/2021 09:26  No results found for this or any previous visit.  No results found for this or any previous visit.  No results found for this or any previous visit.  No results found for this or any previous visit.  No results found for this or any previous visit.  No results found for  this or any previous visit.  No results found for this or any previous visit.   Assessment & Plan:    1. Elevated PSA -Prostate MRI.  RTC 4 weeks - Urinalysis, Routine w reflex microscopic   No follow-ups on file.  Wilkie Aye, MD  [facility]

## 2022-09-05 NOTE — Telephone Encounter (Signed)
Surgical Date: 09/13/2021 Procedure: Inguinal Hernia Repair w/ Mesh, Umbilical Hernia Repair w/ Mesh  Patient came to office to schedule appointment. States that he noted increased bulging and pain in L groin. States that once he lies flat or prone, the bulge reduces and discomfort is improved. Patient reports that he is concerned he has either injured prior surgery site or has developed new hernia.   Appointment scheduled.

## 2022-09-06 DIAGNOSIS — H04123 Dry eye syndrome of bilateral lacrimal glands: Secondary | ICD-10-CM | POA: Diagnosis not present

## 2022-09-11 ENCOUNTER — Ambulatory Visit: Payer: Medicare Other | Admitting: Pharmacist

## 2022-09-11 DIAGNOSIS — E1165 Type 2 diabetes mellitus with hyperglycemia: Secondary | ICD-10-CM

## 2022-09-16 ENCOUNTER — Other Ambulatory Visit: Payer: Self-pay | Admitting: Family Medicine

## 2022-09-16 DIAGNOSIS — E1165 Type 2 diabetes mellitus with hyperglycemia: Secondary | ICD-10-CM

## 2022-09-16 NOTE — Progress Notes (Signed)
  PharmD  Follow Up Note   09/11/2022 Name: KACI FREEL MRN: [id number] DOB: December 18, 1951   Referred by: Loman Brooklyn, FNP Reason for referral : Diabetes   Will follow up in November with patient to re-enroll in patient assistance.  Patient has supply of diabetes medications and home and is stable.  Patient is currently enroll in the [employer] patient assistance program for his insulins and Ozempic.       Regina Eck, PharmD, BCPS Clinical Pharmacist, [place]  II Phone [phone]

## 2022-09-17 ENCOUNTER — Ambulatory Visit: Payer: Medicare Other | Admitting: General Surgery

## 2022-09-17 ENCOUNTER — Encounter: Payer: Self-pay | Admitting: General Surgery

## 2022-09-17 VITALS — BP 172/82 | HR 69 | Temp 98.1°F | Resp 14 | Ht 76.0 in | Wt 231.0 lb

## 2022-09-17 DIAGNOSIS — K4091 Unilateral inguinal hernia, without obstruction or gangrene, recurrent: Secondary | ICD-10-CM | POA: Diagnosis not present

## 2022-09-17 NOTE — Patient Instructions (Signed)
Stop Ozempic one week prior to surgery.

## 2022-09-18 NOTE — H&P (Addendum)
Jeffrey Shaw; [id number]; 04-21-1951   HPI Patient is a 71 year old white male who returns to my care with recurrent swelling and pain in the left groin.  He is status post left inguinal herniorrhaphy with mesh in September 2022.  He states that approximately 1 month ago he was lifting something heavy and felt something pop in the left groin.  Since that time, he has had recurrent pain and swelling in that area.  It is made worse with straining. Past Medical History:  Diagnosis Date   Allergy morphine   Arthritis    CAD (coronary artery disease)    CABG 01/2008 /  Catheterization, November, 2012, LIMA to the LAD patent,  70% distal left main stenosis involving the proximal circumflex, DES to the left main continuing into the circumflex   CORONARY ARTERY BYPASS GRAFT, HX OF 01/17/2010   Qualifier: Diagnosis of  By: Myrtis Ser, MD, [facility], [facility]    Diabetes mellitus    Dr. Nonie Hoyer, [facility]   GERD (gastroesophageal reflux disease)    HTN (hypertension)    Hx of CABG    2009, LIMA to LAD, right radial to PDA, SVG to diagonal, SVG to OM1   Hyperlipemia    Hypothyroid    Primary osteoarthritis of left hip 04/18/2016    Past Surgical History:  Procedure Laterality Date   BACK SURGERY     CARPAL TUNNEL RELEASE     left hand   CORONARY ANGIOPLASTY     CORONARY ARTERY BYPASS GRAFT  01/2008   X 6   INGUINAL HERNIA REPAIR     right   INGUINAL HERNIA REPAIR Left 09/13/2021   Procedure: HERNIA REPAIR INGUINAL ADULT W/MESH;  Surgeon: [name], MD;  Location: [facility];  Service: General;  Laterality: Left;   JOINT REPLACEMENT  hip   LEFT HEART CATHETERIZATION WITH CORONARY/GRAFT ANGIOGRAM N/A 10/30/2011   Procedure: LEFT HEART CATHETERIZATION WITH Isabel Caprice;  Surgeon: Herby Abraham, MD;  Location: [facility] CATH LAB;  Service: Cardiovascular;  Laterality: N/A;   LUMBAR DISC SURGERY     removed   PERCUTANEOUS CORONARY STENT INTERVENTION (PCI-S) N/A 11/03/2011   Procedure:  PERCUTANEOUS CORONARY STENT INTERVENTION (PCI-S);  Surgeon: Kathleene Hazel, MD;  Location: [facility] CATH LAB;  Service: Cardiovascular;  Laterality: N/A;   TONSILLECTOMY  ~ 1955   TOTAL HIP ARTHROPLASTY Left 04/18/2016   Procedure: TOTAL HIP ARTHROPLASTY;  Surgeon: Frederico Hamman, MD;  Location: [facility];  Service: Orthopedics;  Laterality: Left;   UMBILICAL HERNIA REPAIR N/A 09/13/2021   Procedure: HERNIA REPAIR UMBILICAL ADULT W/MESH;  Surgeon: [name], MD;  Location: [facility];  Service: General;  Laterality: N/A;   URETEROLITHOTOMY Left    with stone basket removal    Family History  Problem Relation Age of Onset   Dementia Mother    Liver disease Mother    Suicidality Father    Emphysema Father    Cancer Father    Drug abuse Brother        Clean now   Rheum arthritis Sister    Heart attack Maternal Grandmother    Emphysema Maternal Grandfather    Stroke Maternal Grandfather    Stomach cancer Paternal Grandmother    Diabetes Paternal Aunt    Diabetes Paternal Aunt     Current Outpatient Medications on File Prior to Visit  Medication Sig Dispense Refill   aspirin EC 81 MG tablet Take 81 mg by mouth daily. Swallow whole.     atorvastatin (LIPITOR) 40 MG  tablet Take 1 tablet (40 mg total) by mouth daily. 90 tablet 2   Continuous Blood Gluc Receiver (DEXCOM G6 RECEIVER) DEVI 1 Device by Does not apply route daily. 1 each 0   Continuous Blood Gluc Sensor (DEXCOM G6 SENSOR) MISC APPLY 1 SENSOR AS DIRECTED EVERY 10 DAYS 3 each 5   Continuous Blood Gluc Transmit (DEXCOM G6 TRANSMITTER) MISC USE EVERY 3 MONTHS 2 each 1   famotidine (PEPCID) 20 MG tablet Take 20 mg by mouth 2 (two) times daily.     insulin aspart (NOVOLOG) 100 UNIT/ML FlexPen Inject 8-24 Units into the skin 3 (three) times daily before meals.     Insulin Detemir (LEVEMIR FLEXTOUCH) 100 UNIT/ML Pen Inject 10-25 Units into the skin See admin instructions. 10 units qam, 25 units qhs     levothyroxine (SYNTHROID) 175  MCG tablet Take 1 tablet (175 mcg total) by mouth daily before breakfast. 90 tablet 2   linaclotide (LINZESS) 72 MCG capsule Take 1 capsule (72 mcg total) by mouth daily before breakfast. 90 capsule 1   lisinopril (ZESTRIL) 10 MG tablet TAKE 1 TABLET BY MOUTH DAILY. (GENERIC FOR ZESTRIL) 90 tablet 2   metFORMIN (GLUCOPHAGE-XR) 500 MG 24 hr tablet Take 1 tablet by mouth once daily with breakfast 90 tablet 0   OZEMPIC, 1 MG/DOSE, 4 MG/3ML SOPN Inject 1 mg as directed every Sunday.     sildenafil (VIAGRA) 100 MG tablet TAKE 1 TABLET BY MOUTH ONCE DAILY AS NEEDED FOR  ERECTILE  DYSFUNCTION 30 tablet 2   No current facility-administered medications on file prior to visit.    Allergies  Allergen Reactions   Morphine And Related Nausea Only    Social History   Substance and Sexual Activity  Alcohol Use Yes   Comment: maybe one beer a month    Social History   Tobacco Use  Smoking Status Former   Packs/day: 1.00   Years: 4.00   Total pack years: 4.00   Types: Cigarettes   Quit date: 12/22/1968   Years since quitting: 53.7  Smokeless Tobacco Never    Review of Systems  Constitutional: Negative.   HENT: Negative.    Eyes: Negative.   Respiratory: Negative.    Cardiovascular: Negative.   Gastrointestinal:  Positive for abdominal pain.  Genitourinary: Negative.   Musculoskeletal: Negative.   Skin: Negative.   Neurological: Negative.   Endo/Heme/Allergies: Negative.   Psychiatric/Behavioral: Negative.      Objective   Vitals:   09/17/22 1426  BP: (!) 172/82  Pulse: 69  Resp: 14  Temp: 98.1 F (36.7 C)  SpO2: 96%    Physical Exam Vitals reviewed.  Constitutional:      Appearance: Normal appearance. He is normal weight. He is not ill-appearing.  HENT:     Head: Normocephalic.  Cardiovascular:     Rate and Rhythm: Normal rate and regular rhythm.     Heart sounds: Normal heart sounds. No murmur heard.    No friction rub. No gallop.  Pulmonary:     Effort:  Pulmonary effort is normal. No respiratory distress.     Breath sounds: Normal breath sounds. No stridor. No wheezing, rhonchi or rales.  Abdominal:     General: Abdomen is flat. Bowel sounds are normal.     Palpations: Abdomen is soft. There is no mass.     Tenderness: There is abdominal tenderness. There is no guarding or rebound.     Hernia: A hernia is present.     Comments: A  reducible left inguinal hernia is noted.  Genitourinary:    Testes: Normal.  Skin:    General: Skin is warm and dry.  Neurological:     Mental Status: He is alert and oriented to person, place, and time.   Previous notes reviewed  Assessment  Recurrent left inguinal hernia.  This occurred during heavy lifting. Plan  Patient is scheduled for recurrent left inguinal herniorrhaphy with mesh on 10/09/2022.  The risks and benefits of the procedure including bleeding, infection, mesh use, neuropathy, and the possibility of recurrence of the hernia were fully explained to the patient, who gave informed consent.  Patient has been told to hold his Ozempic 1 week prior to surgery.

## 2022-09-18 NOTE — Progress Notes (Addendum)
Jeffrey Shaw; [id number]; [birth year]   HPI Patient is a 71 year old white male who returns to my care with recurrent swelling and pain in the left groin.  He is status post left inguinal herniorrhaphy with mesh in September 2022.  He states that approximately 1 month ago he was lifting something heavy and felt something pop in the left groin.  Since that time, he has had recurrent pain and swelling in that area.  It is made worse with straining. Past Medical History:  Diagnosis Date   Allergy morphine   Arthritis    CAD (coronary artery disease)    CABG 01/2008 /  Catheterization, November, 2012, LIMA to the LAD patent,  70% distal left main stenosis involving the proximal circumflex, DES to the left main continuing into the circumflex   CORONARY ARTERY BYPASS GRAFT, HX OF 01/17/2010   Qualifier: Diagnosis of  By: Myrtis Ser, MD, [facility], [facility]    Diabetes mellitus    Dr. Nonie Hoyer, [facility]   GERD (gastroesophageal reflux disease)    HTN (hypertension)    Hx of CABG    2009, LIMA to LAD, right radial to PDA, SVG to diagonal, SVG to OM1   Hyperlipemia    Hypothyroid    Primary osteoarthritis of left hip 04/18/2016    Past Surgical History:  Procedure Laterality Date   BACK SURGERY     CARPAL TUNNEL RELEASE     left hand   CORONARY ANGIOPLASTY     CORONARY ARTERY BYPASS GRAFT  01/2008   X 6   INGUINAL HERNIA REPAIR     right   INGUINAL HERNIA REPAIR Left 09/13/2021   Procedure: HERNIA REPAIR INGUINAL ADULT W/MESH;  Surgeon: [name], MD;  Location: [facility];  Service: General;  Laterality: Left;   JOINT REPLACEMENT  hip   LEFT HEART CATHETERIZATION WITH CORONARY/GRAFT ANGIOGRAM N/A 10/30/2011   Procedure: LEFT HEART CATHETERIZATION WITH Isabel Caprice;  Surgeon: Herby Abraham, MD;  Location: [facility] CATH LAB;  Service: Cardiovascular;  Laterality: N/A;   LUMBAR DISC SURGERY     removed   PERCUTANEOUS CORONARY STENT INTERVENTION (PCI-S) N/A 11/03/2011   Procedure:  PERCUTANEOUS CORONARY STENT INTERVENTION (PCI-S);  Surgeon: Kathleene Hazel, MD;  Location: [facility] CATH LAB;  Service: Cardiovascular;  Laterality: N/A;   TONSILLECTOMY  ~ 1955   TOTAL HIP ARTHROPLASTY Left 04/18/2016   Procedure: TOTAL HIP ARTHROPLASTY;  Surgeon: Frederico Hamman, MD;  Location: [facility];  Service: Orthopedics;  Laterality: Left;   UMBILICAL HERNIA REPAIR N/A 09/13/2021   Procedure: HERNIA REPAIR UMBILICAL ADULT W/MESH;  Surgeon: [name], MD;  Location: [facility];  Service: General;  Laterality: N/A;   URETEROLITHOTOMY Left    with stone basket removal    Family History  Problem Relation Age of Onset   Dementia Mother    Liver disease Mother    Suicidality Father    Emphysema Father    Cancer Father    Drug abuse Brother        Clean now   Rheum arthritis Sister    Heart attack Maternal Grandmother    Emphysema Maternal Grandfather    Stroke Maternal Grandfather    Stomach cancer Paternal Grandmother    Diabetes Paternal Aunt    Diabetes Paternal Aunt     Current Outpatient Medications on File Prior to Visit  Medication Sig Dispense Refill   aspirin EC 81 MG tablet Take 81 mg by mouth daily. Swallow whole.     atorvastatin (LIPITOR) 40 MG  tablet Take 1 tablet (40 mg total) by mouth daily. 90 tablet 2   Continuous Blood Gluc Receiver (DEXCOM G6 RECEIVER) DEVI 1 Device by Does not apply route daily. 1 each 0   Continuous Blood Gluc Sensor (DEXCOM G6 SENSOR) MISC APPLY 1 SENSOR AS DIRECTED EVERY 10 DAYS 3 each 5   Continuous Blood Gluc Transmit (DEXCOM G6 TRANSMITTER) MISC USE EVERY 3 MONTHS 2 each 1   famotidine (PEPCID) 20 MG tablet Take 20 mg by mouth 2 (two) times daily.     insulin aspart (NOVOLOG) 100 UNIT/ML FlexPen Inject 8-24 Units into the skin 3 (three) times daily before meals.     Insulin Detemir (LEVEMIR FLEXTOUCH) 100 UNIT/ML Pen Inject 10-25 Units into the skin See admin instructions. 10 units qam, 25 units qhs     levothyroxine (SYNTHROID) 175  MCG tablet Take 1 tablet (175 mcg total) by mouth daily before breakfast. 90 tablet 2   linaclotide (LINZESS) 72 MCG capsule Take 1 capsule (72 mcg total) by mouth daily before breakfast. 90 capsule 1   lisinopril (ZESTRIL) 10 MG tablet TAKE 1 TABLET BY MOUTH DAILY. (GENERIC FOR ZESTRIL) 90 tablet 2   metFORMIN (GLUCOPHAGE-XR) 500 MG 24 hr tablet Take 1 tablet by mouth once daily with breakfast 90 tablet 0   OZEMPIC, 1 MG/DOSE, 4 MG/3ML SOPN Inject 1 mg as directed every Sunday.     sildenafil (VIAGRA) 100 MG tablet TAKE 1 TABLET BY MOUTH ONCE DAILY AS NEEDED FOR  ERECTILE  DYSFUNCTION 30 tablet 2   No current facility-administered medications on file prior to visit.    Allergies  Allergen Reactions   Morphine And Related Nausea Only    Social History   Substance and Sexual Activity  Alcohol Use Yes   Comment: maybe one beer a month    Social History   Tobacco Use  Smoking Status Former   Packs/day: 1.00   Years: 4.00   Total pack years: 4.00   Types: Cigarettes   Quit date: 12/22/1968   Years since quitting: 53.7  Smokeless Tobacco Never    Review of Systems  Constitutional: Negative.   HENT: Negative.    Eyes: Negative.   Respiratory: Negative.    Cardiovascular: Negative.   Gastrointestinal:  Positive for abdominal pain.  Genitourinary: Negative.   Musculoskeletal: Negative.   Skin: Negative.   Neurological: Negative.   Endo/Heme/Allergies: Negative.   Psychiatric/Behavioral: Negative.      Objective   Vitals:   09/17/22 1426  BP: (!) 172/82  Pulse: 69  Resp: 14  Temp: 98.1 F (36.7 C)  SpO2: 96%    Physical Exam Vitals reviewed.  Constitutional:      Appearance: Normal appearance. He is normal weight. He is not ill-appearing.  HENT:     Head: Normocephalic.  Cardiovascular:     Rate and Rhythm: Normal rate and regular rhythm.     Heart sounds: Normal heart sounds. No murmur heard.    No friction rub. No gallop.  Pulmonary:     Effort:  Pulmonary effort is normal. No respiratory distress.     Breath sounds: Normal breath sounds. No stridor. No wheezing, rhonchi or rales.  Abdominal:     General: Abdomen is flat. Bowel sounds are normal.     Palpations: Abdomen is soft. There is no mass.     Tenderness: There is abdominal tenderness. There is no guarding or rebound.     Hernia: A hernia is present.     Comments: A  reducible left inguinal hernia is noted.  Genitourinary:    Testes: Normal.  Skin:    General: Skin is warm and dry.  Neurological:     Mental Status: He is alert and oriented to person, place, and time.   Previous notes reviewed  Assessment  Recurrent left inguinal hernia.  This occurred during heavy lifting. Plan  Patient is scheduled for recurrent left inguinal herniorrhaphy with mesh on 10/09/2022.  The risks and benefits of the procedure including bleeding, infection, mesh use, neuropathy, and the possibility of recurrence of the hernia were fully explained to the patient, who gave informed consent.  Patient will hold his Ozempic 1 week prior to surgery.

## 2022-09-20 LAB — HM DIABETES EYE EXAM

## 2022-09-22 ENCOUNTER — Ambulatory Visit (HOSPITAL_COMMUNITY)
Admission: RE | Admit: 2022-09-22 | Discharge: 2022-09-22 | Disposition: A | Discharge status: Home / Self Care | Payer: Medicare Other | Source: Ambulatory Visit | Attending: Urology | Admitting: Urology

## 2022-09-22 DIAGNOSIS — R590 Localized enlarged lymph nodes: Secondary | ICD-10-CM | POA: Diagnosis not present

## 2022-09-22 DIAGNOSIS — R972 Elevated prostate specific antigen [PSA]: Secondary | ICD-10-CM | POA: Insufficient documentation

## 2022-09-22 DIAGNOSIS — N400 Enlarged prostate without lower urinary tract symptoms: Secondary | ICD-10-CM | POA: Diagnosis not present

## 2022-09-22 MED ORDER — GADOPICLENOL 0.5 MMOL/ML IV SOLN
10.0000 mL | Freq: Once | INTRAVENOUS | Status: AC | PRN
  Administered 2022-09-22: 10 mL via INTRAVENOUS

## 2022-09-24 ENCOUNTER — Telehealth: Payer: Self-pay | Admitting: Pharmacist

## 2022-09-24 DIAGNOSIS — E1165 Type 2 diabetes mellitus with hyperglycemia: Secondary | ICD-10-CM

## 2022-09-24 MED ORDER — INSULIN PEN NEEDLE 29G X 8MM MISC: 11 refills | Status: AC

## 2022-09-24 NOTE — Telephone Encounter (Signed)
Patient requesting 68mm bd pen needles May need longer needle to inject beyond potential scar tissue

## 2022-09-25 ENCOUNTER — Ambulatory Visit: Payer: Medicare Other | Admitting: Pharmacist

## 2022-10-03 ENCOUNTER — Encounter: Payer: Self-pay | Admitting: Urology

## 2022-10-03 ENCOUNTER — Ambulatory Visit (INDEPENDENT_AMBULATORY_CARE_PROVIDER_SITE_OTHER): Payer: Medicare Other | Admitting: Urology

## 2022-10-03 VITALS — BP 119/67 | HR 75

## 2022-10-03 DIAGNOSIS — R972 Elevated prostate specific antigen [PSA]: Secondary | ICD-10-CM

## 2022-10-03 LAB — POCT URINALYSIS DIPSTICK
Bilirubin, UA: NEGATIVE
Blood, UA: NEGATIVE
Glucose, UA: NEGATIVE
Ketones, UA: NEGATIVE
Leukocytes, UA: NEGATIVE
Nitrite, UA: NEGATIVE
Protein, UA: NEGATIVE
Spec Grav, UA: 1.02 (ref 1.010–1.025)
Urobilinogen, UA: 0.2 E.U./dL
pH, UA: 5 (ref 5.0–8.0)

## 2022-10-03 MED ORDER — FINASTERIDE 5 MG PO TABS: 5.0000 mg | ORAL_TABLET | Freq: Every day | ORAL | 3 refills | Status: DC

## 2022-10-03 NOTE — Progress Notes (Unsigned)
10/03/2022 11:25 AM   Jeffrey Shaw October 21, 1951 [id number]  Referring provider: Gwenlyn Fudge, FNP [address] [place],  [place] [address]  No chief complaint on file.   HPI: Jeffrey Shaw is a 71yo here for followup for elevated PSA. 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 PSA 11.3 Prostate volume 104cc. No significant LUTS.    PMH: Past Medical History:  Diagnosis Date   Allergy morphine   Arthritis    CAD (coronary artery disease)    CABG 01/2008 /  Catheterization, November, 2012, LIMA to the LAD patent,  70% distal left main stenosis involving the proximal circumflex, DES to the left main continuing into the circumflex   CORONARY ARTERY BYPASS GRAFT, HX OF 01/17/2010   Qualifier: Diagnosis of  By: Myrtis Ser, MD, [facility], [facility]    Diabetes mellitus    Dr. Nonie Hoyer, [facility]   GERD (gastroesophageal reflux disease)    HTN (hypertension)    Hx of CABG    2009, LIMA to LAD, right radial to PDA, SVG to diagonal, SVG to OM1   Hyperlipemia    Hypothyroid    Primary osteoarthritis of left hip 04/18/2016    Surgical History: Past Surgical History:  Procedure Laterality Date   BACK SURGERY     CARPAL TUNNEL RELEASE     left hand   CORONARY ANGIOPLASTY     CORONARY ARTERY BYPASS GRAFT  01/2008   X 6   INGUINAL HERNIA REPAIR     right   INGUINAL HERNIA REPAIR Left 09/13/2021   Procedure: HERNIA REPAIR INGUINAL ADULT W/MESH;  Surgeon: [name], MD;  Location: [facility];  Service: General;  Laterality: Left;   JOINT REPLACEMENT  hip   LEFT HEART CATHETERIZATION WITH CORONARY/GRAFT ANGIOGRAM N/A 10/30/2011   Procedure: LEFT HEART CATHETERIZATION WITH Isabel Caprice;  Surgeon: Herby Abraham, MD;  Location: [facility] CATH LAB;  Service: Cardiovascular;  Laterality: N/A;   LUMBAR DISC SURGERY     removed   PERCUTANEOUS CORONARY STENT INTERVENTION (PCI-S) N/A 11/03/2011   Procedure: PERCUTANEOUS CORONARY STENT INTERVENTION (PCI-S);  Surgeon:  Kathleene Hazel, MD;  Location: [facility] CATH LAB;  Service: Cardiovascular;  Laterality: N/A;   TONSILLECTOMY  ~ 1955   TOTAL HIP ARTHROPLASTY Left 04/18/2016   Procedure: TOTAL HIP ARTHROPLASTY;  Surgeon: Frederico Hamman, MD;  Location: [facility];  Service: Orthopedics;  Laterality: Left;   UMBILICAL HERNIA REPAIR N/A 09/13/2021   Procedure: HERNIA REPAIR UMBILICAL ADULT W/MESH;  Surgeon: [name], MD;  Location: [facility];  Service: General;  Laterality: N/A;   URETEROLITHOTOMY Left    with stone basket removal    Home Medications:  Allergies as of 10/03/2022       Reactions   Morphine And Related Nausea Only        Medication List        Accurate as of October 03, 2022 11:25 AM. If you have any questions, ask your nurse or doctor.          aspirin EC 81 MG tablet Take 81 mg by mouth daily. Swallow whole.   atorvastatin 40 MG tablet Commonly known as: LIPITOR Take 1 tablet (40 mg total) by mouth daily.   carboxymethylcellulose 0.5 % Soln Commonly known as: REFRESH PLUS Place 1 drop into both eyes at bedtime.   Dexcom G6 Receiver Devi 1 Device by Does not apply route daily.   Dexcom G6 Sensor Misc APPLY 1 SENSOR AS DIRECTED EVERY 10 DAYS  Dexcom G6 Transmitter Misc USE EVERY 3 MONTHS   famotidine 20 MG tablet Commonly known as: PEPCID Take 20 mg by mouth 2 (two) times daily.   insulin aspart 100 UNIT/ML FlexPen Commonly known as: NOVOLOG Inject 8-24 Units into the skin 3 (three) times daily before meals.   Insulin Pen Needle 29G X Misc Use to inject insulin up to 6 times daily as directed. DX E11.65   Levemir FlexTouch 100 UNIT/ML FlexPen Generic drug: insulin detemir Inject 10-25 Units into the skin See admin instructions. Inject 10 units into the skin in the morning and 22 units at bedtime.   levothyroxine 175 MCG tablet Commonly known as: SYNTHROID Take 1 tablet (175 mcg total) by mouth daily before breakfast.   linaclotide 72 MCG  capsule Commonly known as: Linzess Take 1 capsule (72 mcg total) by mouth daily before breakfast.   lisinopril 10 MG tablet Commonly known as: ZESTRIL TAKE 1 TABLET BY MOUTH DAILY. (GENERIC FOR ZESTRIL)   loratadine 10 MG tablet Commonly known as: CLARITIN Take 10 mg by mouth daily.   metFORMIN 500 MG 24 hr tablet Commonly known as: GLUCOPHAGE-XR Take 1 tablet by mouth once daily with breakfast   Ozempic (1 MG/DOSE) 4 MG/3ML Sopn Generic drug: Semaglutide (1 MG/DOSE) Inject 1 mg into the skin every Sunday.   sildenafil 100 MG tablet Commonly known as: VIAGRA TAKE 1 TABLET BY MOUTH ONCE DAILY AS NEEDED FOR  ERECTILE  DYSFUNCTION        Allergies:  Allergies  Allergen Reactions   Morphine And Related Nausea Only    Family History: Family History  Problem Relation Age of Onset   Dementia Mother    Liver disease Mother    Suicidality Father    Emphysema Father    Cancer Father    Drug abuse Brother        Clean now   Rheum arthritis Sister    Heart attack Maternal Grandmother    Emphysema Maternal Grandfather    Stroke Maternal Grandfather    Stomach cancer Paternal Grandmother    Diabetes Paternal Aunt    Diabetes Paternal Aunt     Social History:  reports that he quit smoking about 53 years ago. His smoking use included cigarettes. He has a 4.00 pack-year smoking history. He has never used smokeless tobacco. He reports current alcohol use. He reports that he does not use drugs.  ROS: All other review of systems were reviewed and are negative except what is noted above in HPI  Physical Exam: BP 119/67   Pulse 75   Constitutional:  Alert and oriented, No acute distress. HEENT: [place] AT, moist mucus membranes.  Trachea midline, no masses. Cardiovascular: No clubbing, cyanosis, or edema. Respiratory: Normal respiratory effort, no increased work of breathing. GI: Abdomen is soft, nontender, nondistended, no abdominal masses GU: No CVA tenderness.  Lymph: No  cervical or inguinal lymphadenopathy. Skin: No rashes, bruises or suspicious lesions. Neurologic: Grossly intact, no focal deficits, moving all 4 extremities. Psychiatric: Normal mood and affect.  Laboratory Data: Lab Results  Component Value Date   WBC 7.7 08/06/2022   HGB 15.0 08/06/2022   HCT 44.2 08/06/2022   MCV 93 08/06/2022   PLT 245 08/06/2022    Lab Results  Component Value Date   CREATININE 1.23 08/06/2022    No results found for: "PSA"  No results found for: "TESTOSTERONE"  Lab Results  Component Value Date   HGBA1C 6.5 (H) 08/06/2022    Urinalysis  Component Value Date/Time   COLORURINE YELLOW 04/07/2016 1008   APPEARANCEUR Clear 09/05/2022 1118   LABSPEC 1.012 04/07/2016 1008   PHURINE 5.5 04/07/2016 1008   GLUCOSEU Negative 09/05/2022 1118   HGBUR NEGATIVE 04/07/2016 1008   BILIRUBINUR Negative 09/05/2022 1118   KETONESUR NEGATIVE 04/07/2016 1008   PROTEINUR Negative 09/05/2022 1118   PROTEINUR NEGATIVE 04/07/2016 1008   UROBILINOGEN 1.0 01/31/2008 0840   NITRITE Negative 09/05/2022 1118   NITRITE NEGATIVE 04/07/2016 1008   LEUKOCYTESUR Negative 09/05/2022 1118    Lab Results  Component Value Date   LABMICR Comment 09/05/2022   WBCUA None seen 03/03/2022   LABEPIT None seen 03/03/2022   BACTERIA None seen 03/03/2022    Pertinent Imaging:  Results for orders placed during the hospital encounter of 05/27/21  DG Abd 1 View  Narrative CLINICAL DATA:  LEFT lower quadrant abdominal pain, on and off constipation and diarrhea  EXAM: ABDOMEN - 1 VIEW  COMPARISON:  None  FINDINGS: Normal bowel gas pattern.  No bowel dilatation or bowel wall thickening.  Degenerative disc disease changes thoracolumbar spine with minimal levoconvex scoliosis.  LEFT hip prosthesis.  No urinary tract calcification.  Lung bases clear.  IMPRESSION: Normal bowel gas pattern.   Electronically Signed By: Lavonia Dana M.D. On: 05/28/2021  09:26  No results found for this or any previous visit.  No results found for this or any previous visit.  No results found for this or any previous visit.  No results found for this or any previous visit.  No valid procedures specified. No results found for this or any previous visit.  No results found for this or any previous visit.   Assessment & Plan:    1. Elevated PSA -RTC 6 months with PSA -We will start finasteride 5mg   - POCT urinalysis dipstick   No follow-ups on file.  Nicolette Bang, MD  [facility] [place]

## 2022-10-03 NOTE — Patient Instructions (Signed)

## 2022-10-06 NOTE — Patient Instructions (Signed)
Jeffrey Shaw  10/06/2022     @PREFPERIOPPHARMACY @   Your procedure is scheduled on  10/09/2022.   Report to [facility] at  0600 A.M.   Call this number if you have problems the morning of surgery:  [phone]  If you experience any cold or flu symptoms such as cough, fever, chills, shortness of breath, etc. between now and your scheduled surgery, please notify [phone] at the above number.   Remember:  Do not eat or drink after midnight.        Take 11 units of levemir the night before your procedure.     DO NOT take any medications for diabetes the morning of your procedure.     Take these medicines the morning of surgery with A SIP OF WATER                      pepcid, proscar, levothyroxine, claritin.     Do not wear jewelry, make-up or nail polish.  Do not wear lotions, powders, or perfumes, or deodorant.  Do not shave 48 hours prior to surgery.  Men may shave face and neck.  Do not bring valuables to the hospital.  [facility] is not responsible for any belongings or valuables.  Contacts, dentures or bridgework may not be worn into surgery.  Leave your suitcase in the car.  After surgery it may be brought to your room.  For patients admitted to the hospital, discharge time will be determined by your treatment team.  Patients discharged the day of surgery will not be allowed to drive home and must have someone with them for 24 hours.     Special instructions:   DO NOT smoke tobacco or vape for 24 hours before your procedure.  Please read over the following fact sheets that you were given. Anesthesia Post-op Instructions and Care and Recovery After Surgery      Open Hernia Repair, Adult, Care After What can I expect after the procedure? After the procedure, it is common to have: Mild discomfort. Slight bruising. Mild swelling. Pain in the belly (abdomen). A small amount of blood from the cut from surgery (incision). Follow these instructions  at home: Your doctor may give you more specific instructions. If you have problems, call your doctor. Medicines Take over-the-counter and prescription medicines only as told by your doctor. If told, take steps to prevent problems with pooping (constipation). You may need to: Drink enough fluid to keep your pee (urine) pale yellow. Take medicines. You will be told what medicines to take. Eat foods that are high in fiber. These include beans, whole grains, and fresh fruits and vegetables. Limit foods that are high in fat and sugar. These include fried or sweet foods. Ask your doctor if you should avoid driving or using machines while you are taking your medicine. Incision care  Follow instructions from your doctor about how to take care of your incision. Make sure you: Wash your hands with soap and water for at least 20 seconds before and after you change your bandage (dressing). If you cannot use soap and water, use hand sanitizer. Change your bandage. Leave stitches or skin glue in place for at least 2 weeks. Leave tape strips alone unless you are told to take them off. You may trim the edges of the tape strips if they curl up. Check your incision every day for signs of infection. Check for: More redness, swelling,  or pain. More fluid or blood. Warmth. Pus or a bad smell. Wear loose, soft clothing while your incision heals. Activity  Rest as told by your doctor. Do not lift anything that is heavier than 10 lb (4.5 kg), or the limit that you are told. Do not play contact sports until your doctor says that this is safe. If you were given a sedative during your procedure, do not drive or use machines until your doctor says that it is safe. A sedative is a medicine that helps you relax. Return to your normal activities when your doctor says that it is safe. General instructions Do not take baths, swim, or use a hot tub. Ask your doctor about taking showers or sponge baths. Hold a pillow  over your belly when you cough or sneeze. This helps with pain. Do not smoke or use any products that contain nicotine or tobacco. If you need help quitting, ask your doctor. Keep all follow-up visits. Contact a doctor if: You have any of these signs of infection in or around your incision: More redness, swelling, or pain. More fluid or blood. Warmth. Pus. A bad smell. You have a fever or chills. You have blood in your poop (stool). You have not pooped (had a bowel movement) in 2-3 days. Medicine does not help your pain. Get help right away if: You have chest pain, or you are short of breath. You feel faint or light-headed. You have very bad pain. You vomit and your pain is worse. You have pain, swelling, or redness in a leg. These symptoms may be an emergency. Get help right away. Call your local emergency services (911 in the [place] Do not wait to see if the symptoms will go away. Do not drive yourself to the hospital. Summary After this procedure, it is common to have mild discomfort, slight bruising, and mild swelling. Follow instructions from your doctor about how to take care of your cut from surgery (incision). Check every day for signs of infection. Do not lift heavy objects or play contact sports until your doctor says it is safe. Return to your normal activities as told by your doctor. This information is not intended to replace advice given to you by your health care provider. Make sure you discuss any questions you have with your health care provider. Document Revised: 07/23/2020 Document Reviewed: 07/23/2020 Elsevier Patient Education  [place] Anesthesia, Adult, Care After The following information offers guidance on how to care for yourself after your procedure. Your health care provider may also give you more specific instructions. If you have problems or questions, contact your health care provider. What can I expect after the procedure? After the  procedure, it is common for people to: Have pain or discomfort at the IV site. Have nausea or vomiting. Have a sore throat or hoarseness. Have trouble concentrating. Feel cold or chills. Feel weak, sleepy, or tired (fatigue). Have soreness and body aches. These can affect parts of the body that were not involved in surgery. Follow these instructions at home: For the time period you were told by your health care provider:  Rest. Do not participate in activities where you could fall or become injured. Do not drive or use machinery. Do not drink alcohol. Do not take sleeping pills or medicines that cause drowsiness. Do not make important decisions or sign legal documents. Do not take care of children on your own. General instructions Drink enough fluid to keep your urine pale yellow. If  you have sleep apnea, surgery and certain medicines can increase your risk for breathing problems. Follow instructions from your health care provider about wearing your sleep device: Anytime you are sleeping, including during daytime naps. While taking prescription pain medicines, sleeping medicines, or medicines that make you drowsy. Return to your normal activities as told by your health care provider. Ask your health care provider what activities are safe for you. Take over-the-counter and prescription medicines only as told by your health care provider. Do not use any products that contain nicotine or tobacco. These products include cigarettes, chewing tobacco, and vaping devices, such as e-cigarettes. These can delay incision healing after surgery. If you need help quitting, ask your health care provider. Contact a health care provider if: You have nausea or vomiting that does not get better with medicine. You vomit every time you eat or drink. You have pain that does not get better with medicine. You cannot urinate or have bloody urine. You develop a skin rash. You have a fever. Get help right away  if: You have trouble breathing. You have chest pain. You vomit blood. These symptoms may be an emergency. Get help right away. Call 911. Do not wait to see if the symptoms will go away. Do not drive yourself to the hospital. Summary After the procedure, it is common to have a sore throat, hoarseness, nausea, vomiting, or to feel weak, sleepy, or fatigue. For the time period you were told by your health care provider, do not drive or use machinery. Get help right away if you have difficulty breathing, have chest pain, or vomit blood. These symptoms may be an emergency. This information is not intended to replace advice given to you by your health care provider. Make sure you discuss any questions you have with your health care provider. Document Revised: 03/07/2022 Document Reviewed: 03/07/2022 Elsevier Patient Education  2023 Elsevier Inc. How to Use Chlorhexidine Before Surgery Chlorhexidine gluconate (CHG) is a germ-killing (antiseptic) solution that is used to clean the skin. It can get rid of the bacteria that normally live on the skin and can keep them away for about 24 hours. To clean your skin with CHG, you may be given: A CHG solution to use in the shower or as part of a sponge bath. A prepackaged cloth that contains CHG. Cleaning your skin with CHG may help lower the risk for infection: While you are staying in the intensive care unit of the hospital. If you have a vascular access, such as a central line, to provide short-term or long-term access to your veins. If you have a catheter to drain urine from your bladder. If you are on a ventilator. A ventilator is a machine that helps you breathe by moving air in and out of your lungs. After surgery. What are the risks? Risks of using CHG include: A skin reaction. Hearing loss, if CHG gets in your ears and you have a perforated eardrum. Eye injury, if CHG gets in your eyes and is not rinsed out. The CHG product catching fire. Make  sure that you avoid smoking and flames after applying CHG to your skin. Do not use CHG: If you have a chlorhexidine allergy or have previously reacted to chlorhexidine. On babies younger than 46 months of age. How to use CHG solution Use CHG only as told by your health care provider, and follow the instructions on the label. Use the full amount of CHG as directed. Usually, this is one bottle. During a  shower Follow these steps when using CHG solution during a shower (unless your health care provider gives you different instructions): Start the shower. Use your normal soap and shampoo to wash your face and hair. Turn off the shower or move out of the shower stream. Pour the CHG onto a clean washcloth. Do not use any type of brush or rough-edged sponge. Starting at your neck, lather your body down to your toes. Make sure you follow these instructions: If you will be having surgery, pay special attention to the part of your body where you will be having surgery. Scrub this area for at least 1 minute. Do not use CHG on your head or face. If the solution gets into your ears or eyes, rinse them well with water. Avoid your genital area. Avoid any areas of skin that have broken skin, cuts, or scrapes. Scrub your back and under your arms. Make sure to wash skin folds. Let the lather sit on your skin for 1-2 minutes or as long as told by your health care provider. Thoroughly rinse your entire body in the shower. Make sure that all body creases and crevices are rinsed well. Dry off with a clean towel. Do not put any substances on your body afterward--such as powder, lotion, or perfume--unless you are told to do so by your health care provider. Only use lotions that are recommended by the manufacturer. Put on clean clothes or pajamas. If it is the night before your surgery, sleep in clean sheets.  During a sponge bath Follow these steps when using CHG solution during a sponge bath (unless your health  care provider gives you different instructions): Use your normal soap and shampoo to wash your face and hair. Pour the CHG onto a clean washcloth. Starting at your neck, lather your body down to your toes. Make sure you follow these instructions: If you will be having surgery, pay special attention to the part of your body where you will be having surgery. Scrub this area for at least 1 minute. Do not use CHG on your head or face. If the solution gets into your ears or eyes, rinse them well with water. Avoid your genital area. Avoid any areas of skin that have broken skin, cuts, or scrapes. Scrub your back and under your arms. Make sure to wash skin folds. Let the lather sit on your skin for 1-2 minutes or as long as told by your health care provider. Using a different clean, wet washcloth, thoroughly rinse your entire body. Make sure that all body creases and crevices are rinsed well. Dry off with a clean towel. Do not put any substances on your body afterward--such as powder, lotion, or perfume--unless you are told to do so by your health care provider. Only use lotions that are recommended by the manufacturer. Put on clean clothes or pajamas. If it is the night before your surgery, sleep in clean sheets. How to use CHG prepackaged cloths Only use CHG cloths as told by your health care provider, and follow the instructions on the label. Use the CHG cloth on clean, dry skin. Do not use the CHG cloth on your head or face unless your health care provider tells you to. When washing with the CHG cloth: Avoid your genital area. Avoid any areas of skin that have broken skin, cuts, or scrapes. Before surgery Follow these steps when using a CHG cloth to clean before surgery (unless your health care provider gives you different instructions): Using the CHG  cloth, vigorously scrub the part of your body where you will be having surgery. Scrub using a back-and-forth motion for 3 minutes. The area on your  body should be completely wet with CHG when you are done scrubbing. Do not rinse. Discard the cloth and let the area air-dry. Do not put any substances on the area afterward, such as powder, lotion, or perfume. Put on clean clothes or pajamas. If it is the night before your surgery, sleep in clean sheets.  For general bathing Follow these steps when using CHG cloths for general bathing (unless your health care provider gives you different instructions). Use a separate CHG cloth for each area of your body. Make sure you wash between any folds of skin and between your fingers and toes. Wash your body in the following order, switching to a new cloth after each step: The front of your neck, shoulders, and chest. Both of your arms, under your arms, and your hands. Your stomach and groin area, avoiding the genitals. Your right leg and foot. Your left leg and foot. The back of your neck, your back, and your buttocks. Do not rinse. Discard the cloth and let the area air-dry. Do not put any substances on your body afterward--such as powder, lotion, or perfume--unless you are told to do so by your health care provider. Only use lotions that are recommended by the manufacturer. Put on clean clothes or pajamas. Contact a health care provider if: Your skin gets irritated after scrubbing. You have questions about using your solution or cloth. You swallow any chlorhexidine. Call your local poison control center (704 [phone] 394 2734 in the [place] Get help right away if: Your eyes itch badly, or they become very red or swollen. Your skin itches badly and is red or swollen. Your hearing changes. You have trouble seeing. You have swelling or tingling in your mouth or throat. You have trouble breathing. These symptoms may represent a serious problem that is an emergency. Do not wait to see if the symptoms will go away. Get medical help right away. Call your local emergency services (911 in the [place] Do not drive  yourself to the hospital. Summary Chlorhexidine gluconate (CHG) is a germ-killing (antiseptic) solution that is used to clean the skin. Cleaning your skin with CHG may help to lower your risk for infection. You may be given CHG to use for bathing. It may be in a bottle or in a prepackaged cloth to use on your skin. Carefully follow your health care provider's instructions and the instructions on the product label. Do not use CHG if you have a chlorhexidine allergy. Contact your health care provider if your skin gets irritated after scrubbing. This information is not intended to replace advice given to you by your health care provider. Make sure you discuss any questions you have with your health care provider. Document Revised: 04/07/2022 Document Reviewed: 02/18/2021 Elsevier Patient Education  2023 [employer]

## 2022-10-07 ENCOUNTER — Encounter (HOSPITAL_COMMUNITY)
Admission: RE | Admit: 2022-10-07 | Discharge: 2022-10-07 | Disposition: A | Discharge status: Home / Self Care | Payer: Medicare Other | Source: Ambulatory Visit | Attending: General Surgery | Admitting: General Surgery

## 2022-10-07 ENCOUNTER — Encounter (HOSPITAL_COMMUNITY): Payer: Self-pay

## 2022-10-07 VITALS — BP 135/61 | HR 70 | Temp 98.1°F | Resp 18 | Ht 76.0 in | Wt 231.0 lb

## 2022-10-07 DIAGNOSIS — Z01818 Encounter for other preprocedural examination: Secondary | ICD-10-CM | POA: Diagnosis not present

## 2022-10-07 DIAGNOSIS — E1165 Type 2 diabetes mellitus with hyperglycemia: Secondary | ICD-10-CM | POA: Insufficient documentation

## 2022-10-07 DIAGNOSIS — I10 Essential (primary) hypertension: Secondary | ICD-10-CM | POA: Diagnosis not present

## 2022-10-07 DIAGNOSIS — Z794 Long term (current) use of insulin: Secondary | ICD-10-CM | POA: Insufficient documentation

## 2022-10-07 LAB — HEMOGLOBIN A1C
Hgb A1c MFr Bld: 6.4 % — ABNORMAL HIGH (ref 4.8–5.6)
Mean Plasma Glucose: 136.98 mg/dL

## 2022-10-07 LAB — BASIC METABOLIC PANEL
Anion gap: 6 (ref 5–15)
BUN: 21 mg/dL (ref 8–23)
CO2: 25 mmol/L (ref 22–32)
Calcium: 8.9 mg/dL (ref 8.9–10.3)
Chloride: 105 mmol/L (ref 98–111)
Creatinine, Ser: 1.14 mg/dL (ref 0.61–1.24)
GFR, Estimated: >60 mL/min (ref >60)
Glucose, Bld: 196 mg/dL — ABNORMAL HIGH (ref 70–99)
Potassium: 4.3 mmol/L (ref 3.5–5.1)
Sodium: 136 mmol/L (ref 135–145)

## 2022-10-08 ENCOUNTER — Ambulatory Visit (HOSPITAL_COMMUNITY): Payer: Medicare Other | Admitting: Anesthesiology

## 2022-10-09 ENCOUNTER — Ambulatory Visit (HOSPITAL_COMMUNITY)
Admission: RE | Admit: 2022-10-09 | Discharge: 2022-10-09 | Disposition: A | Discharge status: Home / Self Care | Payer: Medicare Other | Attending: General Surgery | Admitting: General Surgery

## 2022-10-09 ENCOUNTER — Encounter (HOSPITAL_COMMUNITY): Payer: Self-pay | Admitting: General Surgery

## 2022-10-09 ENCOUNTER — Encounter (HOSPITAL_COMMUNITY)
Admission: RE | Disposition: A | Discharge status: Home / Self Care | Payer: Self-pay | Source: Home / Self Care | Attending: General Surgery

## 2022-10-09 DIAGNOSIS — K4091 Unilateral inguinal hernia, without obstruction or gangrene, recurrent: Secondary | ICD-10-CM | POA: Diagnosis not present

## 2022-10-09 DIAGNOSIS — Z539 Procedure and treatment not carried out, unspecified reason: Secondary | ICD-10-CM | POA: Diagnosis not present

## 2022-10-09 LAB — GLUCOSE, CAPILLARY: Glucose-Capillary: 121 mg/dL — ABNORMAL HIGH (ref 70–99)

## 2022-10-09 SURGERY — REPAIR, HERNIA, INGUINAL, ADULT
Anesthesia: General | Laterality: Left

## 2022-10-09 MED ORDER — LIDOCAINE HCL (PF) 2 % IJ SOLN
INTRAMUSCULAR | Status: AC
  Filled 2022-10-09: qty 5

## 2022-10-09 MED ORDER — DEXAMETHASONE SODIUM PHOSPHATE 10 MG/ML IJ SOLN
INTRAMUSCULAR | Status: AC
  Filled 2022-10-09: qty 1

## 2022-10-09 MED ORDER — FENTANYL CITRATE (PF) 100 MCG/2ML IJ SOLN
INTRAMUSCULAR | Status: AC
  Filled 2022-10-09: qty 2

## 2022-10-09 MED ORDER — PHENYLEPHRINE 80 MCG/ML (10ML) SYRINGE FOR IV PUSH (FOR BLOOD PRESSURE SUPPORT)
PREFILLED_SYRINGE | INTRAVENOUS | Status: AC
  Filled 2022-10-09: qty 10

## 2022-10-09 MED ORDER — CHLORHEXIDINE GLUCONATE CLOTH 2 % EX PADS: 6.0000 | MEDICATED_PAD | Freq: Once | CUTANEOUS | Status: DC

## 2022-10-09 MED ORDER — CEFAZOLIN SODIUM-DEXTROSE 2-4 GM/100ML-% IV SOLN: 2.0000 g | INTRAVENOUS | Status: DC

## 2022-10-09 MED ORDER — ONDANSETRON HCL 4 MG/2ML IJ SOLN
INTRAMUSCULAR | Status: AC
  Filled 2022-10-09: qty 2

## 2022-10-09 MED ORDER — ROCURONIUM BROMIDE 10 MG/ML (PF) SYRINGE
PREFILLED_SYRINGE | INTRAVENOUS | Status: AC
  Filled 2022-10-09: qty 10

## 2022-10-09 MED ORDER — MIDAZOLAM HCL 2 MG/2ML IJ SOLN
INTRAMUSCULAR | Status: AC
  Filled 2022-10-09: qty 2

## 2022-10-09 MED ORDER — CEFAZOLIN SODIUM-DEXTROSE 2-4 GM/100ML-% IV SOLN
INTRAVENOUS | Status: AC
  Filled 2022-10-09: qty 100

## 2022-10-09 SURGICAL SUPPLY — 29 items
CLOTH BEACON ORANGE TIMEOUT ST (SAFETY) ×1 IMPLANT
COVER LIGHT HANDLE STERIS (MISCELLANEOUS) ×2 IMPLANT
DERMABOND ADVANCED .7 DNX12 (GAUZE/BANDAGES/DRESSINGS) ×1 IMPLANT
DRAIN PENROSE 0.5X18 (DRAIN) ×1 IMPLANT
ELECT REM PT RETURN 9FT ADLT (ELECTROSURGICAL) ×1
ELECTRODE REM PT RTRN 9FT ADLT (ELECTROSURGICAL) ×1 IMPLANT
GAUZE 4X4 16PLY ~~LOC~~+RFID DBL (SPONGE) ×1 IMPLANT
GAUZE SPONGE 4X4 12PLY STRL (GAUZE/BANDAGES/DRESSINGS) ×1 IMPLANT
GLOVE BIOGEL PI IND STRL 7.0 (GLOVE) ×2 IMPLANT
GLOVE SURG SS PI 7.5 STRL IVOR (GLOVE) ×1 IMPLANT
GOWN STRL REUS W/TWL LRG LVL3 (GOWN DISPOSABLE) ×3 IMPLANT
INST SET MINOR GENERAL (KITS) ×1 IMPLANT
KIT TURNOVER KIT A (KITS) ×1 IMPLANT
MANIFOLD NEPTUNE II (INSTRUMENTS) ×1 IMPLANT
NS IRRIG 1000ML POUR BTL (IV SOLUTION) ×1 IMPLANT
PACK MINOR (CUSTOM PROCEDURE TRAY) ×1 IMPLANT
PAD ARMBOARD 7.5X6 YLW CONV (MISCELLANEOUS) ×1 IMPLANT
SET BASIN LINEN APH (SET/KITS/TRAYS/PACK) ×1 IMPLANT
SOL PREP PROV IODINE SCRUB 4OZ (MISCELLANEOUS) ×1 IMPLANT
SUT MNCRL AB 4-0 PS2 18 (SUTURE) ×1 IMPLANT
SUT NOVA NAB GS-22 2 2-0 T-19 (SUTURE) ×2 IMPLANT
SUT SILK 3 0 (SUTURE)
SUT SILK 3-0 18XBRD TIE 12 (SUTURE) IMPLANT
SUT VIC AB 2-0 CT1 27 (SUTURE) ×1
SUT VIC AB 2-0 CT1 TAPERPNT 27 (SUTURE) ×1 IMPLANT
SUT VIC AB 3-0 SH 27 (SUTURE) ×1
SUT VIC AB 3-0 SH 27X BRD (SUTURE) ×1 IMPLANT
SUT VICRYL AB 2 0 TIES (SUTURE) IMPLANT
SUT VICRYL AB 3 0 TIES (SUTURE) IMPLANT

## 2022-10-09 NOTE — Progress Notes (Signed)
  PharmD   Follow Up Note     09/25/2022 Name: Jeffrey Shaw   MRN: [id number]       DOB: 1951-04-11     Referred by: Loman Brooklyn, FNP Reason for referral : Diabetes     Will follow up in November with patient to re-enroll in patient assistance.  Patient has supply of diabetes medications and home and is stable.  Patient is currently enroll in [employer] patient assistance program for his insulins and Ozempic.  RX for 72mm pen needles called in per patient preference.  This may benefit him due to scar tissue.        Regina Eck, PharmD, BCPS Clinical Pharmacist, [place]  [facility] Phone [phone]

## 2022-10-09 NOTE — Progress Notes (Signed)
Pt. Admitted for surgery.  Upon assessment pt stated that he took his Ozempic on Sunday 10-05-22.  Dr. Briant Cedar & Dr. Arnoldo Morale aware & procedure was cancelled.  Pt to reschedule at earliest convenience with office.

## 2022-10-14 ENCOUNTER — Ambulatory Visit: Payer: Medicare Other | Admitting: General Surgery

## 2022-10-17 NOTE — H&P (Signed)
Jeffrey Shaw; [id number]; 1951-06-25   HPI Patient is a 71 year old white male who returns to my care with recurrent swelling and pain in the left groin.  He is status post left inguinal herniorrhaphy with mesh in September 2022.  He states that approximately 1 month ago he was lifting something heavy and felt something pop in the left groin.  Since that time, he has had recurrent pain and swelling in that area.  It is made worse with straining. Past Medical History:  Diagnosis Date   Allergy morphine   Arthritis    CAD (coronary artery disease)    CABG 01/2008 /  Catheterization, November, 2012, LIMA to the LAD patent,  70% distal left main stenosis involving the proximal circumflex, DES to the left main continuing into the circumflex   CORONARY ARTERY BYPASS GRAFT, HX OF 01/17/2010   Qualifier: Diagnosis of  By: Myrtis Ser, MD, [facility], [place]ens    Diabetes mellitus    Dr. Nonie Hoyer, [facility]   GERD (gastroesophageal reflux disease)    HTN (hypertension)    Hx of CABG    2009, LIMA to LAD, right radial to PDA, SVG to diagonal, SVG to OM1   Hyperlipemia    Hypothyroid    Primary osteoarthritis of left hip 04/18/2016    Past Surgical History:  Procedure Laterality Date   BACK SURGERY     CARPAL TUNNEL RELEASE     left hand   CORONARY ANGIOPLASTY     CORONARY ARTERY BYPASS GRAFT  01/2008   X 6   INGUINAL HERNIA REPAIR     right   INGUINAL HERNIA REPAIR Left 09/13/2021   Procedure: HERNIA REPAIR INGUINAL ADULT W/MESH;  Surgeon: [name], MD;  Location: [facility];  Service: General;  Laterality: Left;   JOINT REPLACEMENT  hip   LEFT HEART CATHETERIZATION WITH CORONARY/GRAFT ANGIOGRAM N/A 10/30/2011   Procedure: LEFT HEART CATHETERIZATION WITH Isabel Caprice;  Surgeon: Herby Abraham, MD;  Location: [facility] CATH LAB;  Service: Cardiovascular;  Laterality: N/A;   LUMBAR DISC SURGERY     removed   PERCUTANEOUS CORONARY STENT INTERVENTION (PCI-S) N/A 11/03/2011   Procedure:  PERCUTANEOUS CORONARY STENT INTERVENTION (PCI-S);  Surgeon: Kathleene Hazel, MD;  Location: [facility] C[facility] LAB;  Service: Cardiovascular;  Laterality: N/A;   TONSILLECTOMY  ~ 1955   TOTAL HIP ARTHROPLASTY Left 04/18/2016   Procedure: TOTAL HIP ARTHROPLASTY;  Surgeon: Frederico Hamman, MD;  Location: [facility];  Service: Orthopedics;  Laterality: Left;   UMBILICAL HERNIA REPAIR N/A 09/13/2021   Procedure: HERNIA REPAIR UMBILICAL ADULT W/MESH;  Surgeon: [name], MD;  Location: [facility];  Service: General;  Laterality: N/A;   URETEROLITHOTOMY Left    with stone basket removal    Family History  Problem Relation Age of Onset   Dementia Mother    Liver disease Mother    Suicidality Father    Emphysema Father    Cancer Father    Drug abuse Brother        Clean now   Rheum arthritis Sister    Heart attack Maternal Grandmother    Emphysema Maternal Grandfather    Stroke Maternal Grandfather    Stomach cancer Paternal Grandmother    Diabetes Paternal Aunt    Diabetes Paternal Aunt     Current Outpatient Medications on File Prior to Visit  Medication Sig Dispense Refill   aspirin EC 81 MG tablet Take 81 mg by mouth daily. Swallow whole.     atorvastatin (LIPITOR) 40 MG  tablet Take 1 tablet (40 mg total) by mouth daily. 90 tablet 2   Continuous Blood Gluc Receiver (DEXCOM G6 RECEIVER) DEVI 1 Device by Does not apply route daily. 1 each 0   Continuous Blood Gluc Sensor (DEXCOM G6 SENSOR) MISC APPLY 1 SENSOR AS DIRECTED EVERY 10 DAYS 3 each 5   Continuous Blood Gluc Transmit (DEXCOM G6 TRANSMITTER) MISC USE EVERY 3 MONTHS 2 each 1   famotidine (PEPCID) 20 MG tablet Take 20 mg by mouth 2 (two) times daily.     insulin aspart (NOVOLOG) 100 UNIT/ML FlexPen Inject 8-24 Units into the skin 3 (three) times daily before meals.     Insulin Detemir (LEVEMIR FLEXTOUCH) 100 UNIT/ML Pen Inject 10-25 Units into the skin See admin instructions. 10 units qam, 25 units qhs     levothyroxine (SYNTHROID) 175  MCG tablet Take 1 tablet (175 mcg total) by mouth daily before breakfast. 90 tablet 2   linaclotide (LINZESS) 72 MCG capsule Take 1 capsule (72 mcg total) by mouth daily before breakfast. 90 capsule 1   lisinopril (ZESTRIL) 10 MG tablet TAKE 1 TABLET BY MOUTH DAILY. (GENERIC FOR ZESTRIL) 90 tablet 2   metFORMIN (GLUCOPHAGE-XR) 500 MG 24 hr tablet Take 1 tablet by mouth once daily with breakfast 90 tablet 0   OZEMPIC, 1 MG/DOSE, 4 MG/3ML SOPN Inject 1 mg as directed every Sunday.     sildenafil (VIAGRA) 100 MG tablet TAKE 1 TABLET BY MOUTH ONCE DAILY AS NEEDED FOR  ERECTILE  DYSFUNCTION 30 tablet 2   No current facility-administered medications on file prior to visit.    Allergies  Allergen Reactions   Morphine And Related Nausea Only    Social History   Substance and Sexual Activity  Alcohol Use Yes   Comment: maybe one beer a month    Social History   Tobacco Use  Smoking Status Former   Packs/day: 1.00   Years: 4.00   Total pack years: 4.00   Types: Cigarettes   Quit date: 12/22/1968   Years since quitting: 53.7  Smokeless Tobacco Never    Review of Systems  Constitutional: Negative.   HENT: Negative.    Eyes: Negative.   Respiratory: Negative.    Cardiovascular: Negative.   Gastrointestinal:  Positive for abdominal pain.  Genitourinary: Negative.   Musculoskeletal: Negative.   Skin: Negative.   Neurological: Negative.   Endo/Heme/Allergies: Negative.   Psychiatric/Behavioral: Negative.      Objective   Vitals:   09/17/22 1426  BP: (!) 172/82  Pulse: 69  Resp: 14  Temp: 98.1 F (36.7 C)  SpO2: 96%    Physical Exam Vitals reviewed.  Constitutional:      Appearance: Normal appearance. He is normal weight. He is not ill-appearing.  HENT:     Head: Normocephalic.  Cardiovascular:     Rate and Rhythm: Normal rate and regular rhythm.     Heart sounds: Normal heart sounds. No murmur heard.    No friction rub. No gallop.  Pulmonary:     Effort:  Pulmonary effort is normal. No respiratory distress.     Breath sounds: Normal breath sounds. No stridor. No wheezing, rhonchi or rales.  Abdominal:     General: Abdomen is flat. Bowel sounds are normal.     Palpations: Abdomen is soft. There is no mass.     Tenderness: There is abdominal tenderness. There is no guarding or rebound.     Hernia: A hernia is present.     Comments: A  reducible left inguinal hernia is noted.  Genitourinary:    Testes: Normal.  Skin:    General: Skin is warm and dry.  Neurological:     Mental Status: He is alert and oriented to person, place, and time.   Previous notes reviewed  Assessment  Recurrent left inguinal hernia.  This occurred during heavy lifting. Plan  Patient is scheduled for recurrent left inguinal herniorrhaphy with mesh on 10/09/2022.  The risks and benefits of the procedure including bleeding, infection, mesh use, neuropathy, and the possibility of recurrence of the hernia were fully explained to the patient, who gave informed consent.  Patient will hold his Ozempic 1 week prior to surgery. Addendum: Patient surgery was canceled on 10/09/2022 due to him not holding his Ozempic.  Since that time, we are now performing robotic laparoscopic inguinal hernia repair.  I have told him that I am being proctored and he will be one of my initial cases.  He is fine with that and would like to proceed with a robotic assisted recurrent laparoscopic left inguinal herniorrhaphy with mesh.  The risks and benefits of the procedure including bleeding, infection, mesh use, and the possibility of recurrence of the hernia were fully explained to the patient, who gave informed consent.  He states he will stop his Ozempic greater than 1 week from his surgery date.

## 2022-10-27 NOTE — Patient Instructions (Signed)
Jeffrey Shaw  10/27/2022     @PREFPERIOPPHARMACY @   Your procedure is scheduled on  10/30/2022.   Report to [facility] at  1140  A.M.   Call this number if you have problems the morning of surgery:  [phone]  If you experience any cold or flu symptoms such as cough, fever, chills, shortness of breath, etc. between now and your scheduled surgery, please notify [phone] at the above number.   Remember:  Do not eat or drink after midnight.        Your last dose of ozempic should have been on 11/2 or before.      Take 11 units of your night time insulin the night before your procedure.     DO NOT take any medications for diabetes the morning of your procedure.     Take these medicines the morning of surgery with A SIP OF WATER                  pepcid, proscar, levothyroxine.     Do not wear jewelry, make-up or nail polish.  Do not wear lotions, powders, or perfumes, or deodorant.  Do not shave 48 hours prior to surgery.  Men may shave face and neck.  Do not bring valuables to the hospital.  [facility] is not responsible for any belongings or valuables.  Contacts, dentures or bridgework may not be worn into surgery.  Leave your suitcase in the car.  After surgery it may be brought to your room.  For patients admitted to the hospital, discharge time will be determined by your treatment team.  Patients discharged the day of surgery will not be allowed to drive home and must have someone with them for 24 hours.    Special instructions:   DO NOT smoke tobacco or vape for 24 hours before your procedure.  Please read over the following fact sheets that you were given. Pain Booklet, Coughing and Deep Breathing, Surgical Site Infection Prevention, Anesthesia Post-op Instructions, and Care and Recovery After Surgery      Laparoscopic Inguinal Hernia Repair, Adult, Care After The following information offers guidance on how to care for yourself after your procedure.  Your health care provider may also give you more specific instructions. If you have problems or questions, contact your health care provider. What can I expect after the procedure? After the procedure, it is common to have: Pain. Swelling and bruising around the incision area. Scrotal swelling, in males. Some fluid or blood draining from your incisions. Follow these instructions at home: Medicines Take over-the-counter and prescription medicines only as told by your health care provider. Ask your health care provider if the medicine prescribed to you: Requires you to avoid driving or using machinery. Can cause constipation. You may need to take these actions to prevent or treat constipation: Drink enough fluid to keep your urine pale yellow. Take over-the-counter or prescription medicines. Eat foods that are high in fiber, such as beans, whole grains, and fresh fruits and vegetables. Limit foods that are high in fat and processed sugars, such as fried or sweet foods. Incision care  Follow instructions from your health care provider about how to take care of your incisions. Make sure you: Wash your hands with soap and water for at least 20 seconds before and after you change your bandage (dressing). If soap and water are not available, use hand sanitizer. Change your dressing as told by your  health care provider. Leave stitches (sutures), skin glue, or adhesive strips in place. These skin closures may need to stay in place for 2 weeks or longer. If adhesive strip edges start to loosen and curl up, you may trim the loose edges. Do not remove adhesive strips completely unless your health care provider tells you to do that. Check your incision area every day for signs of infection. Check for: More redness, swelling, or pain. More fluid or blood. Warmth. Pus or a bad smell. Wear loose, soft clothing while your incisions heal. Managing pain and swelling If directed, put ice on the painful or  swollen areas. To do this: Put ice in a plastic bag. Place a towel between your skin and the bag. Leave the ice on for 20 minutes, 2-3 times a day. Remove the ice if your skin turns bright red. This is very important. If you cannot feel pain, heat, or cold, you have a greater risk of damage to the area.  Activity Do not lift anything that is heavier than 10 lb (4.5 kg), or the limit that you are told, until your health care provider says that it is safe. Ask your health care provider what activities are safe for you. A lot of activity during the first week after surgery can increase pain and swelling. For 1 week after your procedure: Avoid activities that take a lot of effort, such as exercise or sports. You may walk and climb stairs as needed for daily activity, but avoid long walks or climbing stairs for exercise. General instructions If you were given a sedative during the procedure, it can affect you for several hours. Do not drive or operate machinery until your health care provider says that it is safe. Do not take baths, swim, or use a hot tub until your health care provider approves. Ask your health care provider if you may take showers. You may only be allowed to take sponge baths. Do not use any products that contain nicotine or tobacco. These products include cigarettes, chewing tobacco, and vaping devices, such as e-cigarettes. If you need help quitting, ask your health care provider. Keep all follow-up visits. This is important. Contact a health care provider if: You have any of these signs of infection: More redness, swelling, or pain around your incisions or your groin area. More fluid or blood coming from an incision. Warmth coming from an incision. Pus or a bad smell coming from an incision. A fever or chills. You have more swelling in your scrotum, if you are male. You have severe pain and medicines do not help. You have abdominal pain or swelling. You cannot urinate or  have a bowel movement. You faint or feel dizzy. You have nausea and vomiting. Get help right away if: You have redness, warmth, or pain in your leg. You have chest pain. You have problems breathing. These symptoms may represent a serious problem that is an emergency. Do not wait to see if the symptoms will go away. Get medical help right away. Call your local emergency services (911 in the [place] Do not drive yourself to the hospital. Summary Pain, swelling, and bruising are common after the procedure. Check your incision area every day for signs of infection, such as more redness, swelling, or pain. Put ice on painful or swollen areas for 20 minutes, 2-3 times a day. This information is not intended to replace advice given to you by your health care provider. Make sure you discuss any questions you have  with your health care provider. Document Revised: 08/07/2020 Document Reviewed: 08/07/2020 Elsevier Patient Education  2023 [employer] General Anesthesia, Adult, Care After The following information offers guidance on how to care for yourself after your procedure. Your health care provider may also give you more specific instructions. If you have problems or questions, contact your health care provider. What can I expect after the procedure? After the procedure, it is common for people to: Have pain or discomfort at the IV site. Have nausea or vomiting. Have a sore throat or hoarseness. Have trouble concentrating. Feel cold or chills. Feel weak, sleepy, or tired (fatigue). Have soreness and body aches. These can affect parts of the body that were not involved in surgery. Follow these instructions at home: For the time period you were told by your health care provider:  Rest. Do not participate in activities where you could fall or become injured. Do not drive or use machinery. Do not drink alcohol. Do not take sleeping pills or medicines that cause drowsiness. Do not make  important decisions or sign legal documents. Do not take care of children on your own. General instructions Drink enough fluid to keep your urine pale yellow. If you have sleep apnea, surgery and certain medicines can increase your risk for breathing problems. Follow instructions from your health care provider about wearing your sleep device: Anytime you are sleeping, including during daytime naps. While taking prescription pain medicines, sleeping medicines, or medicines that make you drowsy. Return to your normal activities as told by your health care provider. Ask your health care provider what activities are safe for you. Take over-the-counter and prescription medicines only as told by your health care provider. Do not use any products that contain nicotine or tobacco. These products include cigarettes, chewing tobacco, and vaping devices, such as e-cigarettes. These can delay incision healing after surgery. If you need help quitting, ask your health care provider. Contact a health care provider if: You have nausea or vomiting that does not get better with medicine. You vomit every time you eat or drink. You have pain that does not get better with medicine. You cannot urinate or have bloody urine. You develop a skin rash. You have a fever. Get help right away if: You have trouble breathing. You have chest pain. You vomit blood. These symptoms may be an emergency. Get help right away. Call 911. Do not wait to see if the symptoms will go away. Do not drive yourself to the hospital. Summary After the procedure, it is common to have a sore throat, hoarseness, nausea, vomiting, or to feel weak, sleepy, or fatigue. For the time period you were told by your health care provider, do not drive or use machinery. Get help right away if you have difficulty breathing, have chest pain, or vomit blood. These symptoms may be an emergency. This information is not intended to replace advice given to  you by your health care provider. Make sure you discuss any questions you have with your health care provider. Document Revised: 03/07/2022 Document Reviewed: 03/07/2022 Elsevier Patient Education  2023 [employer]. How to Use Chlorhexidine Before Surgery Chlorhexidine gluconate (CHG) is a germ-killing (antiseptic) solution that is used to clean the skin. It can get rid of the bacteria that normally live on the skin and can keep them away for about 24 hours. To clean your skin with CHG, you may be given: A CHG solution to use in the shower or as part of a sponge bath. A  prepackaged cloth that contains CHG. Cleaning your skin with CHG may help lower the risk for infection: While you are staying in the intensive care unit of the hospital. If you have a vascular access, such as a central line, to provide short-term or long-term access to your veins. If you have a catheter to drain urine from your bladder. If you are on a ventilator. A ventilator is a machine that helps you breathe by moving air in and out of your lungs. After surgery. What are the risks? Risks of using CHG include: A skin reaction. Hearing loss, if CHG gets in your ears and you have a perforated eardrum. Eye injury, if CHG gets in your eyes and is not rinsed out. The CHG product catching fire. Make sure that you avoid smoking and flames after applying CHG to your skin. Do not use CHG: If you have a chlorhexidine allergy or have previously reacted to chlorhexidine. On babies younger than 62 months of age. How to use CHG solution Use CHG only as told by your health care provider, and follow the instructions on the label. Use the full amount of CHG as directed. Usually, this is one bottle. During a shower Follow these steps when using CHG solution during a shower (unless your health care provider gives you different instructions): Start the shower. Use your normal soap and shampoo to wash your face and hair. Turn off the  shower or move out of the shower stream. Pour the CHG onto a clean washcloth. Do not use any type of brush or rough-edged sponge. Starting at your neck, lather your body down to your toes. Make sure you follow these instructions: If you will be having surgery, pay special attention to the part of your body where you will be having surgery. Scrub this area for at least 1 minute. Do not use CHG on your head or face. If the solution gets into your ears or eyes, rinse them well with water. Avoid your genital area. Avoid any areas of skin that have broken skin, cuts, or scrapes. Scrub your back and under your arms. Make sure to wash skin folds. Let the lather sit on your skin for 1-2 minutes or as long as told by your health care provider. Thoroughly rinse your entire body in the shower. Make sure that all body creases and crevices are rinsed well. Dry off with a clean towel. Do not put any substances on your body afterward--such as powder, lotion, or perfume--unless you are told to do so by your health care provider. Only use lotions that are recommended by the manufacturer. Put on clean clothes or pajamas. If it is the night before your surgery, sleep in clean sheets.  During a sponge bath Follow these steps when using CHG solution during a sponge bath (unless your health care provider gives you different instructions): Use your normal soap and shampoo to wash your face and hair. Pour the CHG onto a clean washcloth. Starting at your neck, lather your body down to your toes. Make sure you follow these instructions: If you will be having surgery, pay special attention to the part of your body where you will be having surgery. Scrub this area for at least 1 minute. Do not use CHG on your head or face. If the solution gets into your ears or eyes, rinse them well with water. Avoid your genital area. Avoid any areas of skin that have broken skin, cuts, or scrapes. Scrub your back and under your arms.  Make sure to wash skin folds. Let the lather sit on your skin for 1-2 minutes or as long as told by your health care provider. Using a different clean, wet washcloth, thoroughly rinse your entire body. Make sure that all body creases and crevices are rinsed well. Dry off with a clean towel. Do not put any substances on your body afterward--such as powder, lotion, or perfume--unless you are told to do so by your health care provider. Only use lotions that are recommended by the manufacturer. Put on clean clothes or pajamas. If it is the night before your surgery, sleep in clean sheets. How to use CHG prepackaged cloths Only use CHG cloths as told by your health care provider, and follow the instructions on the label. Use the CHG cloth on clean, dry skin. Do not use the CHG cloth on your head or face unless your health care provider tells you to. When washing with the CHG cloth: Avoid your genital area. Avoid any areas of skin that have broken skin, cuts, or scrapes. Before surgery Follow these steps when using a CHG cloth to clean before surgery (unless your health care provider gives you different instructions): Using the CHG cloth, vigorously scrub the part of your body where you will be having surgery. Scrub using a back-and-forth motion for 3 minutes. The area on your body should be completely wet with CHG when you are done scrubbing. Do not rinse. Discard the cloth and let the area air-dry. Do not put any substances on the area afterward, such as powder, lotion, or perfume. Put on clean clothes or pajamas. If it is the night before your surgery, sleep in clean sheets.  For general bathing Follow these steps when using CHG cloths for general bathing (unless your health care provider gives you different instructions). Use a separate CHG cloth for each area of your body. Make sure you wash between any folds of skin and between your fingers and toes. Wash your body in the following order,  switching to a new cloth after each step: The front of your neck, shoulders, and chest. Both of your arms, under your arms, and your hands. Your stomach and groin area, avoiding the genitals. Your right leg and foot. Your left leg and foot. The back of your neck, your back, and your buttocks. Do not rinse. Discard the cloth and let the area air-dry. Do not put any substances on your body afterward--such as powder, lotion, or perfume--unless you are told to do so by your health care provider. Only use lotions that are recommended by the manufacturer. Put on clean clothes or pajamas. Contact a health care provider if: Your skin gets irritated after scrubbing. You have questions about using your solution or cloth. You swallow any chlorhexidine. Call your local poison control center [phone] in the [place] Get help right away if: Your eyes itch badly, or they become very red or swollen. Your skin itches badly and is red or swollen. Your hearing changes. You have trouble seeing. You have swelling or tingling in your mouth or throat. You have trouble breathing. These symptoms may represent a serious problem that is an emergency. Do not wait to see if the symptoms will go away. Get medical help right away. Call your local emergency services (911 in the [place] Do not drive yourself to the hospital. Summary Chlorhexidine gluconate (CHG) is a germ-killing (antiseptic) solution that is used to clean the skin. Cleaning your skin with CHG may help to lower your risk  for infection. You may be given CHG to use for bathing. It may be in a bottle or in a prepackaged cloth to use on your skin. Carefully follow your health care provider's instructions and the instructions on the product label. Do not use CHG if you have a chlorhexidine allergy. Contact your health care provider if your skin gets irritated after scrubbing. This information is not intended to replace advice given to you by your health  care provider. Make sure you discuss any questions you have with your health care provider. Document Revised: 04/07/2022 Document Reviewed: 02/18/2021 Elsevier Patient Education  [employer].

## 2022-10-28 ENCOUNTER — Other Ambulatory Visit: Payer: Self-pay

## 2022-10-28 ENCOUNTER — Encounter (HOSPITAL_COMMUNITY)
Admission: RE | Admit: 2022-10-28 | Discharge: 2022-10-28 | Disposition: A | Discharge status: Home / Self Care | Payer: Medicare Other | Source: Ambulatory Visit | Attending: General Surgery | Admitting: General Surgery

## 2022-10-28 ENCOUNTER — Encounter (HOSPITAL_COMMUNITY): Payer: Self-pay

## 2022-10-28 VITALS — BP 121/69 | HR 70 | Temp 98.1°F | Resp 18 | Ht 76.0 in | Wt 231.0 lb

## 2022-10-28 DIAGNOSIS — Z01812 Encounter for preprocedural laboratory examination: Secondary | ICD-10-CM | POA: Diagnosis not present

## 2022-10-28 DIAGNOSIS — Z01818 Encounter for other preprocedural examination: Secondary | ICD-10-CM

## 2022-10-28 LAB — CBC WITH DIFFERENTIAL/PLATELET
Abs Immature Granulocytes: 0.03 10*3/uL (ref 0.00–0.07)
Basophils Absolute: 0.1 10*3/uL (ref 0.0–0.1)
Basophils Relative: 1 %
Eosinophils Absolute: 0.3 10*3/uL (ref 0.0–0.5)
Eosinophils Relative: 4 %
HCT: 41.6 % (ref 39.0–52.0)
Hemoglobin: 14.3 g/dL (ref 13.0–17.0)
Immature Granulocytes: 0 %
Lymphocytes Relative: 33 %
Lymphs Abs: 2.4 10*3/uL (ref 0.7–4.0)
MCH: 31.5 pg (ref 26.0–34.0)
MCHC: 34.4 g/dL (ref 30.0–36.0)
MCV: 91.6 fL (ref 80.0–100.0)
Monocytes Absolute: 0.6 10*3/uL (ref 0.1–1.0)
Monocytes Relative: 8 %
Neutro Abs: 3.8 10*3/uL (ref 1.7–7.7)
Neutrophils Relative %: 54 %
Platelets: 242 10*3/uL (ref 150–400)
RBC: 4.54 MIL/uL (ref 4.22–5.81)
RDW: 12.1 % (ref 11.5–15.5)
WBC: 7.2 10*3/uL (ref 4.0–10.5)
nRBC: 0 % (ref 0.0–0.2)

## 2022-10-28 LAB — TYPE AND SCREEN
ABO/RH(D): O POS
Antibody Screen: NEGATIVE

## 2022-10-30 ENCOUNTER — Encounter (HOSPITAL_COMMUNITY): Payer: Self-pay | Admitting: General Surgery

## 2022-10-30 ENCOUNTER — Ambulatory Visit (HOSPITAL_COMMUNITY)
Admission: RE | Admit: 2022-10-30 | Discharge: 2022-10-30 | Disposition: A | Discharge status: Home / Self Care | Payer: Medicare Other | Attending: General Surgery | Admitting: General Surgery

## 2022-10-30 ENCOUNTER — Encounter (HOSPITAL_COMMUNITY)
Admission: RE | Disposition: A | Discharge status: Home / Self Care | Payer: Self-pay | Source: Home / Self Care | Attending: General Surgery

## 2022-10-30 ENCOUNTER — Ambulatory Visit (HOSPITAL_BASED_OUTPATIENT_CLINIC_OR_DEPARTMENT_OTHER): Payer: Medicare Other | Admitting: Anesthesiology

## 2022-10-30 ENCOUNTER — Ambulatory Visit (HOSPITAL_COMMUNITY): Payer: Medicare Other | Admitting: Anesthesiology

## 2022-10-30 ENCOUNTER — Other Ambulatory Visit: Payer: Self-pay

## 2022-10-30 DIAGNOSIS — K4091 Unilateral inguinal hernia, without obstruction or gangrene, recurrent: Secondary | ICD-10-CM

## 2022-10-30 DIAGNOSIS — K219 Gastro-esophageal reflux disease without esophagitis: Secondary | ICD-10-CM | POA: Diagnosis not present

## 2022-10-30 DIAGNOSIS — Z7984 Long term (current) use of oral hypoglycemic drugs: Secondary | ICD-10-CM | POA: Diagnosis not present

## 2022-10-30 DIAGNOSIS — Z951 Presence of aortocoronary bypass graft: Secondary | ICD-10-CM | POA: Diagnosis not present

## 2022-10-30 DIAGNOSIS — I10 Essential (primary) hypertension: Secondary | ICD-10-CM | POA: Diagnosis not present

## 2022-10-30 DIAGNOSIS — E119 Type 2 diabetes mellitus without complications: Secondary | ICD-10-CM | POA: Diagnosis not present

## 2022-10-30 DIAGNOSIS — M1990 Unspecified osteoarthritis, unspecified site: Secondary | ICD-10-CM | POA: Insufficient documentation

## 2022-10-30 DIAGNOSIS — E039 Hypothyroidism, unspecified: Secondary | ICD-10-CM | POA: Insufficient documentation

## 2022-10-30 DIAGNOSIS — I2510 Atherosclerotic heart disease of native coronary artery without angina pectoris: Secondary | ICD-10-CM | POA: Diagnosis not present

## 2022-10-30 DIAGNOSIS — Z87891 Personal history of nicotine dependence: Secondary | ICD-10-CM | POA: Insufficient documentation

## 2022-10-30 DIAGNOSIS — K4090 Unilateral inguinal hernia, without obstruction or gangrene, not specified as recurrent: Secondary | ICD-10-CM

## 2022-10-30 HISTORY — PX: XI ROBOTIC ASSISTED INGUINAL HERNIA REPAIR WITH MESH: SHX6706

## 2022-10-30 LAB — GLUCOSE, CAPILLARY: Glucose-Capillary: 207 mg/dL — ABNORMAL HIGH (ref 70–99)

## 2022-10-30 SURGERY — REPAIR, HERNIA, INGUINAL, ROBOT-ASSISTED, LAPAROSCOPIC, USING MESH
Anesthesia: General | Site: Inguinal | Laterality: Left

## 2022-10-30 MED ORDER — PHENYLEPHRINE 80 MCG/ML (10ML) SYRINGE FOR IV PUSH (FOR BLOOD PRESSURE SUPPORT)
PREFILLED_SYRINGE | INTRAVENOUS | Status: DC | PRN
  Administered 2022-10-30: 80 ug via INTRAVENOUS

## 2022-10-30 MED ORDER — BUPIVACAINE LIPOSOME 1.3 % IJ SUSP
INTRAMUSCULAR | Status: DC | PRN
  Administered 2022-10-30: 20 mL

## 2022-10-30 MED ORDER — ROCURONIUM BROMIDE 10 MG/ML (PF) SYRINGE
PREFILLED_SYRINGE | INTRAVENOUS | Status: DC | PRN
  Administered 2022-10-30: 70 mg via INTRAVENOUS
  Administered 2022-10-30: 30 mg via INTRAVENOUS
  Administered 2022-10-30: 20 mg via INTRAVENOUS
  Administered 2022-10-30: 40 mg via INTRAVENOUS

## 2022-10-30 MED ORDER — FENTANYL CITRATE (PF) 100 MCG/2ML IJ SOLN
INTRAMUSCULAR | Status: AC
  Filled 2022-10-30: qty 2

## 2022-10-30 MED ORDER — CEFAZOLIN SODIUM-DEXTROSE 2-4 GM/100ML-% IV SOLN
2.0000 g | INTRAVENOUS | Status: AC
  Administered 2022-10-30: 2 g via INTRAVENOUS
  Filled 2022-10-30: qty 100

## 2022-10-30 MED ORDER — ONDANSETRON HCL 4 MG/2ML IJ SOLN: 4.0000 mg | Freq: Once | INTRAMUSCULAR | Status: DC | PRN

## 2022-10-30 MED ORDER — FENTANYL CITRATE (PF) 250 MCG/5ML IJ SOLN
INTRAMUSCULAR | Status: AC
  Filled 2022-10-30: qty 5

## 2022-10-30 MED ORDER — HYDROMORPHONE HCL 1 MG/ML IJ SOLN: 0.2500 mg | INTRAMUSCULAR | Status: DC | PRN

## 2022-10-30 MED ORDER — MEPERIDINE HCL 50 MG/ML IJ SOLN: 6.2500 mg | INTRAMUSCULAR | Status: DC | PRN

## 2022-10-30 MED ORDER — LIDOCAINE HCL (PF) 2 % IJ SOLN
INTRAMUSCULAR | Status: AC
  Filled 2022-10-30: qty 5

## 2022-10-30 MED ORDER — DEXAMETHASONE SODIUM PHOSPHATE 10 MG/ML IJ SOLN
INTRAMUSCULAR | Status: DC | PRN
  Administered 2022-10-30: 10 mg via INTRAVENOUS

## 2022-10-30 MED ORDER — CHLORHEXIDINE GLUCONATE CLOTH 2 % EX PADS: 6.0000 | MEDICATED_PAD | Freq: Once | CUTANEOUS | Status: DC

## 2022-10-30 MED ORDER — PHENYLEPHRINE 80 MCG/ML (10ML) SYRINGE FOR IV PUSH (FOR BLOOD PRESSURE SUPPORT)
PREFILLED_SYRINGE | INTRAVENOUS | Status: AC
  Filled 2022-10-30: qty 10

## 2022-10-30 MED ORDER — LABETALOL HCL 5 MG/ML IV SOLN
INTRAVENOUS | Status: DC | PRN
  Administered 2022-10-30: 5 mg via INTRAVENOUS

## 2022-10-30 MED ORDER — ROCURONIUM BROMIDE 10 MG/ML (PF) SYRINGE
PREFILLED_SYRINGE | INTRAVENOUS | Status: AC
  Filled 2022-10-30: qty 20

## 2022-10-30 MED ORDER — CHLORHEXIDINE GLUCONATE 0.12 % MT SOLN
15.0000 mL | Freq: Once | OROMUCOSAL | Status: AC
  Administered 2022-10-30: 15 mL via OROMUCOSAL

## 2022-10-30 MED ORDER — EPHEDRINE SULFATE (PRESSORS) 50 MG/ML IJ SOLN
INTRAMUSCULAR | Status: DC | PRN
  Administered 2022-10-30: 5 mg via INTRAVENOUS

## 2022-10-30 MED ORDER — ONDANSETRON HCL 4 MG/2ML IJ SOLN
INTRAMUSCULAR | Status: AC
  Filled 2022-10-30: qty 2

## 2022-10-30 MED ORDER — ONDANSETRON HCL 4 MG/2ML IJ SOLN
INTRAMUSCULAR | Status: DC | PRN
  Administered 2022-10-30: 4 mg via INTRAVENOUS

## 2022-10-30 MED ORDER — LACTATED RINGERS IV SOLN
INTRAVENOUS | Status: DC
  Administered 2022-10-30: 1000 mL via INTRAVENOUS

## 2022-10-30 MED ORDER — BUPIVACAINE LIPOSOME 1.3 % IJ SUSP
INTRAMUSCULAR | Status: AC
  Filled 2022-10-30: qty 20

## 2022-10-30 MED ORDER — STERILE WATER FOR IRRIGATION IR SOLN
Status: DC | PRN
  Administered 2022-10-30: 250 mL

## 2022-10-30 MED ORDER — LABETALOL HCL 5 MG/ML IV SOLN
INTRAVENOUS | Status: AC
  Filled 2022-10-30: qty 4

## 2022-10-30 MED ORDER — FENTANYL CITRATE (PF) 100 MCG/2ML IJ SOLN
INTRAMUSCULAR | Status: DC | PRN
  Administered 2022-10-30 (×2): 100 ug via INTRAVENOUS
  Administered 2022-10-30 (×2): 50 ug via INTRAVENOUS
  Administered 2022-10-30: 25 ug via INTRAVENOUS
  Administered 2022-10-30: 50 ug via INTRAVENOUS

## 2022-10-30 MED ORDER — LIDOCAINE HCL (CARDIAC) PF 100 MG/5ML IV SOSY
PREFILLED_SYRINGE | INTRAVENOUS | Status: DC | PRN
  Administered 2022-10-30 (×2): 60 mg via INTRATRACHEAL

## 2022-10-30 MED ORDER — DEXMEDETOMIDINE HCL IN NACL 80 MCG/20ML IV SOLN
INTRAVENOUS | Status: AC
  Filled 2022-10-30: qty 20

## 2022-10-30 MED ORDER — DEXMEDETOMIDINE HCL IN NACL 80 MCG/20ML IV SOLN
INTRAVENOUS | Status: DC | PRN
  Administered 2022-10-30 (×2): 8 ug via BUCCAL

## 2022-10-30 MED ORDER — ORAL CARE MOUTH RINSE: 15.0000 mL | Freq: Once | OROMUCOSAL | Status: AC

## 2022-10-30 MED ORDER — DEXAMETHASONE SODIUM PHOSPHATE 10 MG/ML IJ SOLN
INTRAMUSCULAR | Status: AC
  Filled 2022-10-30: qty 1

## 2022-10-30 MED ORDER — HYDROCODONE-ACETAMINOPHEN 5-325 MG PO TABS: 1.0000 | ORAL_TABLET | Freq: Four times a day (QID) | ORAL | 0 refills | Status: DC | PRN

## 2022-10-30 MED ORDER — EPHEDRINE 5 MG/ML INJ
INTRAVENOUS | Status: AC
  Filled 2022-10-30: qty 5

## 2022-10-30 MED ORDER — PROPOFOL 10 MG/ML IV BOLUS
INTRAVENOUS | Status: DC | PRN
  Administered 2022-10-30: 180 mg via INTRAVENOUS

## 2022-10-30 SURGICAL SUPPLY — 43 items
CANNULA REDUC XI 12-8 STAPL (CANNULA) ×1
CANNULA REDUCER 12-8 DVNC XI (CANNULA) IMPLANT
COVER MAYO STAND XLG (MISCELLANEOUS) IMPLANT
COVER TIP SHEARS 8 DVNC (MISCELLANEOUS) ×1 IMPLANT
COVER TIP SHEARS 8MM DA VINCI (MISCELLANEOUS) ×1
DERMABOND ADVANCED .7 DNX12 (GAUZE/BANDAGES/DRESSINGS) ×1 IMPLANT
DRAPE 3/4 80X56 (DRAPES) ×1 IMPLANT
DRAPE ARM DVNC X/XI (DISPOSABLE) ×3 IMPLANT
DRAPE COLUMN DVNC XI (DISPOSABLE) ×1 IMPLANT
DRAPE DA VINCI XI ARM (DISPOSABLE) ×3
DRAPE DA VINCI XI COLUMN (DISPOSABLE) ×1
DRAPE HALF SHEET 40X57 (DRAPES) IMPLANT
ELECT REM PT RETURN 9FT ADLT (ELECTROSURGICAL) ×1
ELECTRODE REM PT RTRN 9FT ADLT (ELECTROSURGICAL) ×1 IMPLANT
GLOVE BIOGEL PI IND STRL 7.0 (GLOVE) ×1 IMPLANT
GLOVE SURG SS PI 7.5 STRL IVOR (GLOVE) ×2 IMPLANT
GOWN STRL REUS W/TWL LRG LVL3 (GOWN DISPOSABLE) ×2 IMPLANT
IRRIGATION STRYKERFLOW (MISCELLANEOUS) ×1 IMPLANT
IRRIGATOR STRYKERFLOW (MISCELLANEOUS) ×1
IV NS 1000ML (IV SOLUTION)
IV NS 1000ML BAXH (IV SOLUTION) IMPLANT
KIT TURNOVER KIT A (KITS) ×1 IMPLANT
MANIFOLD NEPTUNE II (INSTRUMENTS) ×1 IMPLANT
MESH 3DMAX 4X6 LT LRG (Mesh General) IMPLANT
NEEDLE HYPO 22GX1.5 SAFETY (NEEDLE) ×1 IMPLANT
OBTURATOR OPTICAL STANDARD 8MM (TROCAR) ×1
OBTURATOR OPTICAL STND 8 DVNC (TROCAR) ×1
OBTURATOR OPTICALSTD 8 DVNC (TROCAR) ×1 IMPLANT
PACK LAP CHOLECYSTECTOMY (MISCELLANEOUS) ×1 IMPLANT
PENCIL SMOKE EVACUATOR (MISCELLANEOUS) ×1 IMPLANT
SEAL CANN UNIV 5-8 DVNC XI (MISCELLANEOUS) ×3 IMPLANT
SEAL XI 5MM-8MM UNIVERSAL (MISCELLANEOUS) ×3
SET TUBE SMOKE EVAC HIGH FLOW (TUBING) ×1 IMPLANT
SOLUTION ELECTROLUBE (MISCELLANEOUS) ×1 IMPLANT
STAPLER CANNULA SEAL DVNC XI (STAPLE) ×1 IMPLANT
STAPLER CANNULA SEAL XI (STAPLE) ×2
SUT MNCRL AB 4-0 PS2 18 (SUTURE) ×1 IMPLANT
SUT V-LOC 90 ABS 3-0 VLT  V-20 (SUTURE) ×2
SUT V-LOC 90 ABS 3-0 VLT V-20 (SUTURE) ×2 IMPLANT
SUT VIC AB 3-0 SH 27 (SUTURE) ×3
SUT VIC AB 3-0 SH 27X BRD (SUTURE) IMPLANT
SUT VICRYL 0 UR6 27IN ABS (SUTURE) IMPLANT
WATER STERILE IRR 500ML POUR (IV SOLUTION) ×1 IMPLANT

## 2022-10-30 NOTE — Interval H&P Note (Signed)
History and Physical Interval Note:  10/30/2022 12:47 PM  Jeffrey Shaw  has presented today for surgery, with the diagnosis of RECURRENT LEFT INGUINAL HERNIA.  The various methods of treatment have been discussed with the patient and family. After consideration of risks, benefits and other options for treatment, the patient has consented to  Procedure(s): XI ROBOTIC ASSISTED INGUINAL HERNIA REPAIR WITH MESH (Left) as a surgical intervention.  The patient's history has been reviewed, patient examined, no change in status, stable for surgery.  I have reviewed the patient's chart and labs.  Questions were answered to the patient's satisfaction.     [name]

## 2022-10-30 NOTE — Anesthesia Postprocedure Evaluation (Signed)
Anesthesia Post Note  Patient: Jeffrey Shaw  Procedure(s) Performed: XI ROBOTIC ASSISTED INGUINAL HERNIA REPAIR WITH MESH (Left: Inguinal)  Patient location during evaluation: PACU Anesthesia Type: General Level of consciousness: awake and alert and oriented Pain management: pain level controlled Vital Signs Assessment: post-procedure vital signs reviewed and stable Respiratory status: spontaneous breathing, nonlabored ventilation and respiratory function stable Cardiovascular status: blood pressure returned to baseline and stable Postop Assessment: no apparent nausea or vomiting Anesthetic complications: no  No notable events documented.   Last Vitals:  Vitals:   10/30/22 1800 10/30/22 1810  BP: (!) 159/76   Pulse: 71 71  Resp: 17 18  Temp: 36.8 C   SpO2: 96% 99%    Last Pain:  Vitals:   10/30/22 1810  TempSrc:   PainSc: 0-No pain                 Neira Bentsen C Andreka Stucki

## 2022-10-30 NOTE — Transfer of Care (Addendum)
Immediate Anesthesia Transfer of Care Note  Patient: Jeffrey Shaw  Procedure(s) Performed: XI ROBOTIC ASSISTED INGUINAL HERNIA REPAIR WITH MESH (Left: Inguinal)  Patient Location: PACU  Anesthesia Type:General  Level of Consciousness: awake, sedated, drowsy, and patient cooperative  Airway & Oxygen Therapy: Patient Spontanous Breathing and Patient connected to face mask oxygen  Post-op Assessment: Report given to RN and Post -op Vital signs reviewed and stable  Post vital signs: Reviewed and stable  Last Vitals:  Vitals Value Taken Time  BP 159/76 10/30/22 1800  Temp 36.8 C 10/30/22 1800  Pulse 69 10/30/22 1802  Resp 15 10/30/22 1802  SpO2 99 % 10/30/22 1802  Vitals shown include unvalidated device data.  Last Pain:  Vitals:   10/30/22 1221  TempSrc: Oral  PainSc: 0-No pain      Patients Stated Pain Goal: 8 (10/30/22 1221)  Complications: No notable events documented.

## 2022-10-30 NOTE — Anesthesia Preprocedure Evaluation (Signed)
Anesthesia Evaluation  Patient identified by MRN, date of birth, ID band Patient awake    Reviewed: Allergy & Precautions, H&P , NPO status , Patient's Chart, lab work & pertinent test results  Airway        Dental  (+) Dental Advisory Given   Pulmonary neg pulmonary ROS, former smoker   Pulmonary exam normal breath sounds clear to auscultation       Cardiovascular Exercise Tolerance: Good hypertension, Pt. on medications + CAD and + CABG  Normal cardiovascular exam Rhythm:Regular Rate:Normal     Neuro/Psych negative neurological ROS  negative psych ROS   GI/Hepatic Neg liver ROS,GERD  Medicated,,  Endo/Other  diabetes, Well Controlled, Type 2, Oral Hypoglycemic AgentsHypothyroidism    Renal/GU negative Renal ROS  negative genitourinary   Musculoskeletal  (+) Arthritis , Osteoarthritis,    Abdominal   Peds negative pediatric ROS (+)  Hematology negative hematology ROS (+)   Anesthesia Other Findings   Reproductive/Obstetrics negative OB ROS                             Anesthesia Physical Anesthesia Plan  ASA: 3  Anesthesia Plan: General   Post-op Pain Management: Dilaudid IV   Induction: Intravenous  PONV Risk Score and Plan: 4 or greater and Ondansetron and Dexamethasone  Airway Management Planned: Oral ETT  Additional Equipment:   Intra-op Plan:   Post-operative Plan: Extubation in OR  Informed Consent: I have reviewed the patients History and Physical, chart, labs and discussed the procedure including the risks, benefits and alternatives for the proposed anesthesia with the patient or authorized representative who has indicated his/her understanding and acceptance.     Dental advisory given  Plan Discussed with: CRNA and Surgeon  Anesthesia Plan Comments:         Anesthesia Quick Evaluation

## 2022-10-30 NOTE — Op Note (Addendum)
Patient:  Jeffrey Shaw  DOB:  02-09-1951  MRN:  [id number]   Preop Diagnosis: Recurrent left inguinal hernia  Postop Diagnosis: Same  Procedure: Robotic assisted laparoscopic recurrent left inguinal herniorrhaphy with mesh  Surgeon: [name], MD  Anes: General endotracheal  Indications: Patient is a 71 year old white male status post a left inguinal herniorrhaphy in the past who now presents with a recurrence of the left inguinal hernia.  The risks and benefits of the procedure including bleeding, infection, bowel injury, and the possibility of recurrence of the hernia were fully explained to the patient, who gave informed consent.  Procedure note: The patient was placed in the supine position.  After induction of general endotracheal anesthesia, the abdomen was prepped and draped using the usual sterile technique with ChloraPrep.  Surgical site confirmation was performed.  A Veress needle was introduced into the left upper quadrant.  Confirmation of placement was done using the saline drop test.  The abdomen was then insufflated to 15 mmHg pressure.  A 12 mm trocar was placed in this region under direct visualization without difficulty.  An additional 8 mm trocar was placed in the epigastric region and right upper quadrant regions under direct visualization.  The patient was placed in Trendelenburg position.  The robot was then targeted and docked.  The right inguinal region was without herniation.  A section of the sigmoid colon was tented up into the recurrent hernia region.  This was freed away both sharply and bluntly in order to facilitate exposure.  The peritoneum was mobilized anteriorly from the midline to the anterior superior iliac spine.  The peritoneum was then brought down to an area below the Cooper's ligament.  The previously placed plug was noted and the hernia sac was dissected free from the plug.  Care was taken to avoid the testicular vessels as well as the spermatic cord.   Once an adequate flap had been obtained, a Bard 3D max large heavyweight mesh was then placed into the pocket.  A single 3-0 Vicryl suture was placed and fixated the mesh at Cooper's ligament.  Adequate coverage was noted.  The peritoneal flap was then closed using a 2-0 V-Loc running suture.  An additional rent in the peritoneum where the mesh had been adherent to the previously placed plug was closed using 3-0 Vicryl interrupted sutures.  All fluid and air were then evacuated from the abdominal cavity once the 12 mm port was closed using a 0 Ethibond fascial suture.  All wounds were injected with Exparel.  All incisions were closed using a 4-0 Monocryl subcuticular suture.  Dermabond was applied.  All tape and needle counts were correct at the end of the procedure.  Patient was extubated in the operating room and transferred to PACU in stable condition.  Drs.Florin and [employer] were present for the proctoring of this case.  Complications: None  EBL: Minimal  Specimen: None

## 2022-10-30 NOTE — Anesthesia Procedure Notes (Signed)
Procedure Name: Intubation Date/Time: 10/30/2022 1:38 PM  Performed by: Karna Dupes, CRNAPre-anesthesia Checklist: Patient identified, Emergency Drugs available, Suction available and Patient being monitored Patient Re-evaluated:Patient Re-evaluated prior to induction Oxygen Delivery Method: Circle system utilized Preoxygenation: Pre-oxygenation with 100% oxygen Induction Type: IV induction Ventilation: Mask ventilation without difficulty Laryngoscope Size: Mac and 4 Grade View: Grade II Tube type: Oral Tube size: 7.5 mm Number of attempts: 1 Airway Equipment and Method: Stylet and Oral airway Placement Confirmation: ETT inserted through vocal cords under direct vision, positive ETCO2 and breath sounds checked- equal and bilateral Secured at: 22 cm Tube secured with: Tape Dental Injury: Teeth and Oropharynx as per pre-operative assessment

## 2022-11-05 ENCOUNTER — Other Ambulatory Visit (HOSPITAL_COMMUNITY): Payer: Medicare Other

## 2022-11-05 ENCOUNTER — Encounter (HOSPITAL_COMMUNITY): Payer: Self-pay | Admitting: General Surgery

## 2022-11-07 ENCOUNTER — Encounter: Payer: Self-pay | Admitting: General Surgery

## 2022-11-07 ENCOUNTER — Ambulatory Visit (INDEPENDENT_AMBULATORY_CARE_PROVIDER_SITE_OTHER): Payer: Medicare Other | Admitting: General Surgery

## 2022-11-07 DIAGNOSIS — Z09 Encounter for follow-up examination after completed treatment for conditions other than malignant neoplasm: Secondary | ICD-10-CM

## 2022-11-07 NOTE — Progress Notes (Signed)
Subjective:     Jeffrey Shaw  Virtual telephone visit performed with patient.  Patient states he is doing very well.  He has no groin pain.  He states he voids better now. Objective:    There were no vitals taken for this visit.  General:  Alert       Assessment:    Doing well postoperatively.    Plan:   Patient has already returned back to work.  He was encouraged to increase his activity as able.  Follow-up with me as needed.  As this was a part of the global surgical fee, this was not a billable visit.  Total telephone time was 2-1/2 minutes.

## 2022-11-26 ENCOUNTER — Telehealth: Payer: Medicare Other

## 2022-11-26 ENCOUNTER — Telehealth: Payer: Self-pay | Admitting: Pharmacist

## 2022-11-26 NOTE — Telephone Encounter (Signed)
VM left on patient's cell phone  Please mail applications for patient assistance Ozempic 1mg  Novolog 18 units 3 times daily with meals as needed Levemir 10-22 units daily (they may be d/c'ing--when can sub tresiba u-200 if so)  Thank you!

## 2022-11-27 NOTE — Telephone Encounter (Signed)
MAILING APPLICATION TO PT HOME 

## 2022-12-11 ENCOUNTER — Other Ambulatory Visit: Payer: Self-pay | Admitting: Family Medicine

## 2022-12-11 DIAGNOSIS — E1165 Type 2 diabetes mellitus with hyperglycemia: Secondary | ICD-10-CM

## 2023-01-05 ENCOUNTER — Other Ambulatory Visit: Payer: Self-pay

## 2023-01-05 ENCOUNTER — Encounter: Payer: Self-pay | Admitting: Family Medicine

## 2023-01-05 DIAGNOSIS — E039 Hypothyroidism, unspecified: Secondary | ICD-10-CM

## 2023-01-05 MED ORDER — LEVOTHYROXINE SODIUM 175 MCG PO TABS: 175.0000 ug | ORAL_TABLET | Freq: Every day | ORAL | 0 refills | Status: DC

## 2023-01-05 MED ORDER — LISINOPRIL 10 MG PO TABS: ORAL_TABLET | ORAL | 0 refills | Status: DC

## 2023-01-06 ENCOUNTER — Other Ambulatory Visit: Payer: Self-pay | Admitting: Family Medicine

## 2023-01-06 DIAGNOSIS — E1165 Type 2 diabetes mellitus with hyperglycemia: Secondary | ICD-10-CM

## 2023-01-13 DIAGNOSIS — M25561 Pain in right knee: Secondary | ICD-10-CM | POA: Diagnosis not present

## 2023-01-14 ENCOUNTER — Encounter: Payer: Self-pay | Admitting: Family Medicine

## 2023-02-01 ENCOUNTER — Other Ambulatory Visit: Payer: Self-pay | Admitting: Family Medicine

## 2023-02-01 DIAGNOSIS — E1165 Type 2 diabetes mellitus with hyperglycemia: Secondary | ICD-10-CM

## 2023-02-06 ENCOUNTER — Encounter: Payer: Self-pay | Admitting: Family Medicine

## 2023-02-06 ENCOUNTER — Ambulatory Visit (INDEPENDENT_AMBULATORY_CARE_PROVIDER_SITE_OTHER): Payer: Medicare Other | Admitting: Family Medicine

## 2023-02-06 VITALS — BP 122/70 | HR 74 | Temp 96.8°F | Ht 76.0 in | Wt 231.0 lb

## 2023-02-06 DIAGNOSIS — E039 Hypothyroidism, unspecified: Secondary | ICD-10-CM | POA: Diagnosis not present

## 2023-02-06 DIAGNOSIS — Z1211 Encounter for screening for malignant neoplasm of colon: Secondary | ICD-10-CM

## 2023-02-06 DIAGNOSIS — E782 Mixed hyperlipidemia: Secondary | ICD-10-CM | POA: Diagnosis not present

## 2023-02-06 DIAGNOSIS — E1169 Type 2 diabetes mellitus with other specified complication: Secondary | ICD-10-CM

## 2023-02-06 DIAGNOSIS — Z79899 Other long term (current) drug therapy: Secondary | ICD-10-CM | POA: Insufficient documentation

## 2023-02-06 DIAGNOSIS — I700 Atherosclerosis of aorta: Secondary | ICD-10-CM

## 2023-02-06 DIAGNOSIS — E1159 Type 2 diabetes mellitus with other circulatory complications: Secondary | ICD-10-CM

## 2023-02-06 DIAGNOSIS — Z794 Long term (current) use of insulin: Secondary | ICD-10-CM

## 2023-02-06 DIAGNOSIS — E1165 Type 2 diabetes mellitus with hyperglycemia: Secondary | ICD-10-CM

## 2023-02-06 DIAGNOSIS — I152 Hypertension secondary to endocrine disorders: Secondary | ICD-10-CM

## 2023-02-06 DIAGNOSIS — E785 Hyperlipidemia, unspecified: Secondary | ICD-10-CM

## 2023-02-06 DIAGNOSIS — I2510 Atherosclerotic heart disease of native coronary artery without angina pectoris: Secondary | ICD-10-CM | POA: Diagnosis not present

## 2023-02-06 DIAGNOSIS — Z1212 Encounter for screening for malignant neoplasm of rectum: Secondary | ICD-10-CM

## 2023-02-06 MED ORDER — LEVOTHYROXINE SODIUM 175 MCG PO TABS: 175.0000 ug | ORAL_TABLET | Freq: Every day | ORAL | 1 refills | Status: DC

## 2023-02-06 MED ORDER — ATORVASTATIN CALCIUM 40 MG PO TABS: 40.0000 mg | ORAL_TABLET | Freq: Every day | ORAL | 1 refills | Status: DC

## 2023-02-06 MED ORDER — LISINOPRIL 10 MG PO TABS: ORAL_TABLET | ORAL | 1 refills | Status: DC

## 2023-02-06 NOTE — Progress Notes (Signed)
Subjective:  Patient ID: Jeffrey Shaw, male    DOB: 07/10/1951, 72 y.o.   MRN: [id number]  Patient Care Team: Baruch Gouty, FNP as PCP - General (Family Medicine) Harlen Labs, MD as Referring Physician (Optometry) Blanca Friend, Royce Macadamia, [facility] as Pharmacist (Family Medicine) Jacelyn Pi, MD as Referring Physician (Endocrinology) Alyson Ingles Candee Furbish, MD as Consulting Physician (Urology)   Chief Complaint:  Establish Care Blanch Media patient )   HPI: Jeffrey Shaw is a 72 y.o. male presenting on 02/06/2023 for Establish Care Blanch Media patient )  Pt presents today to establish care with new PCP and for management of chronic medical conditions.   1. Aortic atherosclerosis ([place]) Pt is on ASA and statin therapy. Very active daily and follows a good diet. No anginal symptoms.   2. Coronary artery disease involving native coronary artery of native heart without angina pectoris Followed by cardiology on a regular basis. Compliant with medications. No anginal symptoms. Follows a healthy diet and works out daily.   3. Hypertension associated with type 2 diabetes mellitus ([place]) On ACEi and tolerating well. No chest pain, leg swelling, headaches, visual changes, weakness, confusion, or syncope.   4. Hyperlipidemia associated with type 2 diabetes mellitus ([place]) On statin therapy and tolerating well. Denies myalgias. Does follow a healthy diet and exercise routine.   5. Controlled type 2 diabetes mellitus with hyperglycemia, with long-term current use of insulin ([place]) Followed by endocrinology on a regular basis. Last A1C 6.9. Denies polyuria, polyphagia, or polydipsia.   6. Acquired hypothyroidism On repletion therapy and tolerating well. No hyper- or hypothyroid symptoms.    Relevant past medical, surgical, family, and social history reviewed and updated as indicated.  Allergies and medications reviewed and updated. Data reviewed: Chart in Epic.   Past Medical History:  Diagnosis Date    Allergy morphine   Arthritis    CAD (coronary artery disease)    CABG 01/2008 /  Catheterization, November, 2012, LIMA to the LAD patent,  70% distal left main stenosis involving the proximal circumflex, DES to the left main continuing into the circumflex   CORONARY ARTERY BYPASS GRAFT, HX OF 01/17/2010   Qualifier: Diagnosis of  By: Ron Parker, MD, [facility], [place]    Diabetes mellitus    Dr. Tod Persia, [facility]   GERD (gastroesophageal reflux disease)    HTN (hypertension)    Hx of CABG    2009, LIMA to LAD, right radial to PDA, SVG to diagonal, SVG to OM1   Hyperlipemia    Hypothyroid    Primary osteoarthritis of left hip 04/18/2016    Past Surgical History:  Procedure Laterality Date   BACK SURGERY     CARPAL TUNNEL RELEASE     left hand   CATARACT EXTRACTION Bilateral    CORONARY ANGIOPLASTY     CORONARY ARTERY BYPASS GRAFT  01/2008   X 6   INGUINAL HERNIA REPAIR     right   INGUINAL HERNIA REPAIR Left 09/13/2021   Procedure: HERNIA REPAIR INGUINAL ADULT W/MESH;  Surgeon: Aviva Signs, MD;  Location: [facility];  Service: General;  Laterality: Left;   JOINT REPLACEMENT  hip   LEFT HEART CATHETERIZATION WITH CORONARY/GRAFT ANGIOGRAM N/A 10/30/2011   Procedure: LEFT HEART CATHETERIZATION WITH Beatrix Fetters;  Surgeon: Hillary Bow, MD;  Location: [facility] CATH LAB;  Service: Cardiovascular;  Laterality: N/A;   LUMBAR DISC SURGERY     removed   PERCUTANEOUS CORONARY STENT INTERVENTION (PCI-S) N/A 11/03/2011  Procedure: PERCUTANEOUS CORONARY STENT INTERVENTION (PCI-S);  Surgeon: Burnell Blanks, MD;  Location: [facility] CATH LAB;  Service: Cardiovascular;  Laterality: N/A;   TONSILLECTOMY  ~ [place] Left 04/18/2016   Procedure: TOTAL HIP ARTHROPLASTY;  Surgeon: Earlie Server, MD;  Location: [place];  Service: Orthopedics;  Laterality: Left;   UMBILICAL HERNIA REPAIR N/A 09/13/2021   Procedure: HERNIA REPAIR UMBILICAL ADULT W/MESH;  Surgeon: Aviva Signs,  MD;  Location: [facility];  Service: General;  Laterality: N/A;   URETEROLITHOTOMY Left    with stone basket removal   XI ROBOTIC ASSISTED INGUINAL HERNIA REPAIR WITH MESH Left 10/30/2022   Procedure: XI ROBOTIC [place];  Surgeon: Aviva Signs, MD;  Location: [facility];  Service: General;  Laterality: Left;    Social History   Socioeconomic History   Marital status: Married    Spouse name: SANDRA   Number of children: 0   Years of education: Not on file   Highest education level: 12th grade  Occupational History   Occupation: SELF EMPLOYED    Comment: retired   Occupation: Armed forces operational officer: [employer]    Comment: 5 hours/ 5 days per week  Tobacco Use   Smoking status: Former    Packs/day: 1.00    Years: 4.00    Total pack years: 4.00    Types: Cigarettes    Quit date: 12/22/1968    Years since quitting: 54.1   Smokeless tobacco: Never  Vaping Use   Vaping Use: Never used  Substance and Sexual Activity   Alcohol use: Yes    Comment: maybe one beer a month   Drug use: No   Sexual activity: Yes    Birth control/protection: None  Other Topics Concern   Not on file  Social History Narrative   Lives in one level home with his wife.   Very active - works at [employer], plays basketball, walks daily   Social Determinants of Health   Financial Resource Strain: Low Risk  (06/04/2022)   Overall Financial Resource Strain (CARDIA)    Difficulty of Paying Living Expenses: Not hard at all  Food Insecurity: No Food Insecurity (06/04/2022)   Hunger Vital Sign    Worried About Running Out of Food in the Last Year: Never true    [place] in the Last Year: Never true  Transportation Needs: No Transportation Needs (06/04/2022)   PRAPARE - Hydrologist (Medical): No    Lack of Transportation (Non-Medical): No  Physical Activity: Sufficiently Active (06/04/2022)   Exercise Vital Sign    Days of Exercise per Week: 5 days     Minutes of Exercise per Session: 60 min  Stress: No Stress Concern Present (06/04/2022)   [place]    Feeling of Stress : Not at all  Social Connections: Moderately Integrated (06/04/2022)   Social Connection and Isolation Panel [NHANES]    Frequency of Communication with Friends and Family: Three times a week    Frequency of Social Gatherings with Friends and Family: Twice a week    Attends Religious Services: Never    Marine scientist or Organizations: Yes    Attends Music therapist: More than 4 times per year    Marital Status: Married  Human resources officer Violence: Not At Risk (06/04/2022)   Humiliation, Afraid, Rape, and Kick questionnaire  Fear of Current or Ex-Partner: No    Emotionally Abused: No    Physically Abused: No    Sexually Abused: No    Outpatient Encounter Medications as of 02/06/2023  Medication Sig   aspirin EC 81 MG tablet Take 81 mg by mouth daily. Swallow whole.   Cholecalciferol (VITAMIN D3 PO) Take 1 tablet by mouth daily.   Continuous Blood Gluc Receiver (DEXCOM G6 RECEIVER) DEVI 1 Device by Does not apply route daily.   Continuous Blood Gluc Sensor (DEXCOM G6 SENSOR) MISC APPLY A NEW SENSOR EVERY 10 DAYS   Continuous Blood Gluc Transmit (DEXCOM G6 TRANSMITTER) MISC USE EVERY 3 MONTHS   Cyanocobalamin (B-12 PO) Take 1 tablet by mouth daily.   famotidine (PEPCID) 20 MG tablet Take 20 mg by mouth 2 (two) times daily.   finasteride (PROSCAR) 5 MG tablet Take 1 tablet (5 mg total) by mouth daily.   fluticasone (FLONASE) 50 MCG/ACT nasal spray Place 1 spray into both nostrils daily.   HYDROcodone-acetaminophen (NORCO) 5-325 MG tablet Take 1 tablet by mouth every 6 (six) hours as needed for moderate pain.   insulin aspart (NOVOLOG) 100 UNIT/ML FlexPen Inject 10-18 Units into the skin 3 (three) times daily before meals.   Insulin Detemir (LEVEMIR FLEXTOUCH) 100 UNIT/ML Pen  Inject 10-22 Units into the skin See admin instructions. Inject 10 units into the skin in the morning and 22 units at bedtime.   Insulin Pen Needle 29G X 8MM MISC Use to inject insulin up to 6 times daily as directed. DX E11.65   linaclotide (LINZESS) 72 MCG capsule Take 1 capsule (72 mcg total) by mouth daily before breakfast.   MAGNESIUM PO Take 1 tablet by mouth daily.   metFORMIN (GLUCOPHAGE-XR) 500 MG 24 hr tablet Take 1 tablet (500 mg total) by mouth daily with breakfast.   OZEMPIC, 1 MG/DOSE, 4 MG/3ML SOPN Inject 1 mg into the skin every Sunday.   sildenafil (VIAGRA) 100 MG tablet TAKE 1 TABLET BY MOUTH ONCE DAILY AS NEEDED FOR  ERECTILE  DYSFUNCTION   [DISCONTINUED] atorvastatin (LIPITOR) 40 MG tablet Take 1 tablet (40 mg total) by mouth daily. (Patient taking differently: Take 40 mg by mouth at bedtime.)   [DISCONTINUED] carboxymethylcellulose (REFRESH PLUS) 0.5 % SOLN Place 1 drop into both eyes at bedtime.   [DISCONTINUED] levothyroxine (SYNTHROID) 175 MCG tablet Take 1 tablet (175 mcg total) by mouth daily before breakfast.   [DISCONTINUED] lisinopril (ZESTRIL) 10 MG tablet TAKE 1 TABLET BY MOUTH DAILY. (GENERIC FOR ZESTRIL)   atorvastatin (LIPITOR) 40 MG tablet Take 1 tablet (40 mg total) by mouth daily.   levothyroxine (SYNTHROID) 175 MCG tablet Take 1 tablet (175 mcg total) by mouth daily before breakfast.   lisinopril (ZESTRIL) 10 MG tablet TAKE 1 TABLET BY MOUTH DAILY. (GENERIC FOR ZESTRIL)   No facility-administered encounter medications on file as of 02/06/2023.    Allergies  Allergen Reactions   Morphine And Related Nausea Only    Review of Systems  Constitutional:  Negative for activity change, appetite change, chills, diaphoresis, fatigue, fever and unexpected weight change.  HENT: Negative.    Eyes: Negative.  Negative for photophobia and visual disturbance.  Respiratory:  Negative for cough, chest tightness and shortness of breath.   Cardiovascular:  Negative for  chest pain, palpitations and leg swelling.  Gastrointestinal:  Negative for abdominal pain, blood in stool, constipation, diarrhea, nausea and vomiting.  Endocrine: Negative.  Negative for polydipsia, polyphagia and polyuria.  Genitourinary:  Negative for decreased  urine volume, difficulty urinating, dysuria, frequency and urgency.  Musculoskeletal:  Negative for arthralgias and myalgias.  Skin: Negative.   Allergic/Immunologic: Negative.   Neurological:  Negative for dizziness, tremors, seizures, syncope, facial asymmetry, speech difficulty, weakness, light-headedness, numbness and headaches.  Hematological: Negative.   Psychiatric/Behavioral:  Negative for confusion, hallucinations, sleep disturbance and suicidal ideas.   All other systems reviewed and are negative.       Objective:  BP 122/70   Pulse 74   Temp (!) 96.8 F (36 C) (Temporal)   Ht 6' 4"$  (1.93 m)   Wt 231 lb (104.8 kg)   SpO2 98%   BMI 28.12 kg/m    Wt Readings from Last 3 Encounters:  02/06/23 231 lb (104.8 kg)  10/30/22 231 lb 0.7 oz (104.8 kg)  10/28/22 231 lb (104.8 kg)    Physical Exam Vitals and nursing note reviewed.  Constitutional:      General: He is not in acute distress.    Appearance: Normal appearance. He is well-developed and well-groomed. He is not ill-appearing, toxic-appearing or diaphoretic.  HENT:     Head: Normocephalic and atraumatic.     Jaw: There is normal jaw occlusion.     Right Ear: Hearing normal.     Left Ear: Hearing normal.     Nose: Nose normal.     Mouth/Throat:     Lips: Pink.     Mouth: Mucous membranes are moist.     Pharynx: Oropharynx is clear. Uvula midline.  Eyes:     General: Lids are normal.     Extraocular Movements: Extraocular movements intact.     Conjunctiva/sclera: Conjunctivae normal.     Pupils: Pupils are equal, round, and reactive to light.  Neck:     Thyroid: No thyroid mass, thyromegaly or thyroid tenderness.     Vascular: No carotid bruit or  JVD.     Trachea: Trachea and phonation normal.  Cardiovascular:     Rate and Rhythm: Normal rate and regular rhythm.     Chest Wall: PMI is not displaced.     Pulses: Normal pulses.     Heart sounds: Normal heart sounds. No murmur heard.    No friction rub. No gallop.  Pulmonary:     Effort: Pulmonary effort is normal. No respiratory distress.     Breath sounds: Normal breath sounds. No wheezing.  Abdominal:     General: There is no abdominal bruit.     Palpations: There is no hepatomegaly or splenomegaly.  Musculoskeletal:        General: Normal range of motion.     Right lower leg: No edema.     Left lower leg: No edema.  Lymphadenopathy:     Cervical: No cervical adenopathy.  Skin:    General: Skin is warm and dry.     Capillary Refill: Capillary refill takes less than 2 seconds.     Coloration: Skin is not cyanotic, jaundiced or pale.     Findings: No rash.  Neurological:     General: No focal deficit present.     Mental Status: He is alert and oriented to person, place, and time.     Sensory: Sensation is intact.     Motor: Motor function is intact.     Coordination: Coordination is intact.     Gait: Gait is intact.     Deep Tendon Reflexes: Reflexes are normal and symmetric.  Psychiatric:        Attention and Perception: Attention and  perception normal.        Mood and Affect: Mood and affect normal.        Speech: Speech normal.        Behavior: Behavior normal. Behavior is cooperative.        Thought Content: Thought content normal.        Cognition and Memory: Cognition and memory normal.        Judgment: Judgment normal.     Results for orders placed or performed during the hospital encounter of 10/30/22  Glucose, capillary  Result Value Ref Range   Glucose-Capillary 207 (H) 70 - 99 mg/dL       Pertinent labs & imaging results that were available during my care of the patient were reviewed by me and considered in my medical decision making.  Assessment  & Plan:  [place] was seen today for establish care.  Diagnoses and all orders for this visit:  Controlled type 2 diabetes mellitus with hyperglycemia, with long-term current use of insulin ([place])  Followed by endocrinology. Discussed possibly simplifying diabetic regimen by increasing Ozempic and tapering off of insulin. Consider starting Wilder Glade and stopping metformin. Diet and exercise encouraged. Labs pending.  -     atorvastatin (LIPITOR) 40 MG tablet; Take 1 tablet (40 mg total) by mouth daily. -     CMP14+EGFR -     CBC with Differential/Platelet -     Lipid panel -     Thyroid Panel With TSH -     Microalbumin / creatinine urine ratio -     Vitamin B12 -     VITAMIN D 25 Hydroxy (Vit-D Deficiency, Fractures)  Aortic atherosclerosis (HCC) On ASA and statin. Continue diet and exercise.  -     atorvastatin (LIPITOR) 40 MG tablet; Take 1 tablet (40 mg total) by mouth daily. -     CMP14+EGFR -     Lipid panel  Coronary artery disease involving native coronary artery of native heart without angina pectoris In ASA and statin. No anginal symptoms.  -     atorvastatin (LIPITOR) 40 MG tablet; Take 1 tablet (40 mg total) by mouth daily. -     CMP14+EGFR -     Lipid panel  Hypertension associated with type 2 diabetes mellitus (HCC) BP well controlled. Changes were not made in regimen today. Goal BP is 130/80. Pt aware to report any persistent high or low readings. DASH diet and exercise encouraged. Exercise at least 150 minutes per week and increase as tolerated. Goal BMI > 25. Stress management encouraged. Avoid nicotine and tobacco product use. Avoid excessive alcohol and NSAID's. Avoid more than 2000 mg of sodium daily. Medications as prescribed. Follow up as scheduled.  -     lisinopril (ZESTRIL) 10 MG tablet; TAKE 1 TABLET BY MOUTH DAILY. (GENERIC FOR ZESTRIL)  Hyperlipidemia associated with type 2 diabetes mellitus ([place]) Diet encouraged - increase intake of fresh fruits and vegetables,  increase intake of lean proteins. Bake, broil, or grill foods. Avoid fried, greasy, and fatty foods. Avoid fast foods. Increase intake of fiber-rich whole grains. Exercise encouraged - at least 150 minutes per week and advance as tolerated.  Goal BMI < 25. Continue medications as prescribed. Follow up in 3-6 months as discussed.  -     atorvastatin (LIPITOR) 40 MG tablet; Take 1 tablet (40 mg total) by mouth daily. -     CMP14+EGFR -     Lipid panel  Acquired hypothyroidism Thyroid disease has been well controlled.  Labs are pending. Adjustments to regimen will be made if warranted. Make sure to take medications on an empty stomach with a full glass of water. Make sure to avoid vitamins or supplements for at least 4 hours before and 4 hours after taking medications. Repeat labs in 3 months if adjustments are made and in 6 months if stable.   -     levothyroxine (SYNTHROID) 175 MCG tablet; Take 1 tablet (175 mcg total) by mouth daily before breakfast. -     Thyroid Panel With TSH  High risk medication use Long term metformin use. Will check below for deficiencies.  -     Vitamin B12 -     VITAMIN D 25 Hydroxy (Vit-D Deficiency, Fractures)  Screening for colorectal cancer -     Cologuard     Continue all other maintenance medications.  Follow up plan: Return in about 6 months (around 08/07/2023).   Continue healthy lifestyle choices, including diet (rich in fruits, vegetables, and lean proteins, and low in salt and simple carbohydrates) and exercise (at least 30 minutes of moderate physical activity daily).  Educational handout given for health maintenance  The above assessment and management plan was discussed with the patient. The patient verbalized understanding of and has agreed to the management plan. Patient is aware to call the clinic if they develop any new symptoms or if symptoms persist or worsen. Patient is aware when to return to the clinic for a follow-up visit. Patient  educated on when it is appropriate to go to the emergency department.   Monia Pouch, FNP-C [facility] [phone]

## 2023-02-07 LAB — CBC WITH DIFFERENTIAL/PLATELET
Basophils Absolute: 0.1 10*3/uL (ref 0.0–0.2)
Basos: 1 %
EOS (ABSOLUTE): 0.1 10*3/uL (ref 0.0–0.4)
Eos: 2 %
Hematocrit: 44.3 % (ref 37.5–51.0)
Hemoglobin: 14.7 g/dL (ref 13.0–17.7)
Immature Grans (Abs): 0 10*3/uL (ref 0.0–0.1)
Immature Granulocytes: 0 %
Lymphocytes Absolute: 2.4 10*3/uL (ref 0.7–3.1)
Lymphs: 29 %
MCH: 30.8 pg (ref 26.6–33.0)
MCHC: 33.2 g/dL (ref 31.5–35.7)
MCV: 93 fL (ref 79–97)
Monocytes Absolute: 0.6 10*3/uL (ref 0.1–0.9)
Monocytes: 7 %
Neutrophils Absolute: 5 10*3/uL (ref 1.4–7.0)
Neutrophils: 61 %
Platelets: 298 10*3/uL (ref 150–450)
RBC: 4.78 x10E6/uL (ref 4.14–5.80)
RDW: 12.3 % (ref 11.6–15.4)
WBC: 8.1 10*3/uL (ref 3.4–10.8)

## 2023-02-07 LAB — LIPID PANEL
Chol/HDL Ratio: 2.1 ratio (ref 0.0–5.0)
Cholesterol, Total: 148 mg/dL (ref 100–199)
HDL: 69 mg/dL (ref >39)
LDL Chol Calc (NIH): 69 mg/dL (ref 0–99)
Triglycerides: 45 mg/dL (ref 0–149)
VLDL Cholesterol Cal: 10 mg/dL (ref 5–40)

## 2023-02-07 LAB — THYROID PANEL WITH TSH
Free Thyroxine Index: 4.3 (ref 1.2–4.9)
T3 Uptake Ratio: 35 % (ref 24–39)
T4, Total: 12.2 ug/dL — ABNORMAL HIGH (ref 4.5–12.0)
TSH: 0.589 u[IU]/mL (ref 0.450–4.500)

## 2023-02-07 LAB — CMP14+EGFR
ALT: 32 IU/L (ref 0–44)
AST: 31 IU/L (ref 0–40)
Albumin/Globulin Ratio: 1.8 (ref 1.2–2.2)
Albumin: 4.6 g/dL (ref 3.8–4.8)
Alkaline Phosphatase: 87 IU/L (ref 44–121)
BUN/Creatinine Ratio: 13 (ref 10–24)
BUN: 16 mg/dL (ref 8–27)
Bilirubin Total: 1.7 mg/dL — ABNORMAL HIGH (ref 0.0–1.2)
CO2: 21 mmol/L (ref 20–29)
Calcium: 10.1 mg/dL (ref 8.6–10.2)
Chloride: 102 mmol/L (ref 96–106)
Creatinine, Ser: 1.21 mg/dL (ref 0.76–1.27)
Globulin, Total: 2.6 g/dL (ref 1.5–4.5)
Glucose: 181 mg/dL — ABNORMAL HIGH (ref 70–99)
Potassium: 5.3 mmol/L — ABNORMAL HIGH (ref 3.5–5.2)
Sodium: 138 mmol/L (ref 134–144)
Total Protein: 7.2 g/dL (ref 6.0–8.5)
eGFR: 64 mL/min/{1.73_m2} (ref >59)

## 2023-02-07 LAB — VITAMIN B12: Vitamin B-12: >2000 pg/mL — ABNORMAL HIGH (ref 232–1245)

## 2023-02-07 LAB — VITAMIN D 25 HYDROXY (VIT D DEFICIENCY, FRACTURES): Vit D, 25-Hydroxy: 40 ng/mL (ref 30.0–100.0)

## 2023-02-08 LAB — MICROALBUMIN / CREATININE URINE RATIO
Creatinine, Urine: 88.7 mg/dL
Microalb/Creat Ratio: 7 mg/g creat (ref 0–29)
Microalbumin, Urine: 6.1 ug/mL

## 2023-02-13 DIAGNOSIS — E039 Hypothyroidism, unspecified: Secondary | ICD-10-CM | POA: Diagnosis not present

## 2023-02-13 DIAGNOSIS — E7800 Pure hypercholesterolemia, unspecified: Secondary | ICD-10-CM | POA: Diagnosis not present

## 2023-02-13 DIAGNOSIS — E139 Other specified diabetes mellitus without complications: Secondary | ICD-10-CM | POA: Diagnosis not present

## 2023-02-15 DIAGNOSIS — Z1212 Encounter for screening for malignant neoplasm of rectum: Secondary | ICD-10-CM | POA: Diagnosis not present

## 2023-02-15 DIAGNOSIS — Z1211 Encounter for screening for malignant neoplasm of colon: Secondary | ICD-10-CM | POA: Diagnosis not present

## 2023-02-17 ENCOUNTER — Other Ambulatory Visit: Payer: Self-pay | Admitting: Family Medicine

## 2023-02-17 DIAGNOSIS — H40033 Anatomical narrow angle, bilateral: Secondary | ICD-10-CM | POA: Diagnosis not present

## 2023-02-17 DIAGNOSIS — H2513 Age-related nuclear cataract, bilateral: Secondary | ICD-10-CM | POA: Diagnosis not present

## 2023-02-17 DIAGNOSIS — E1165 Type 2 diabetes mellitus with hyperglycemia: Secondary | ICD-10-CM

## 2023-02-17 DIAGNOSIS — H524 Presbyopia: Secondary | ICD-10-CM | POA: Diagnosis not present

## 2023-02-17 LAB — HM DIABETES EYE EXAM

## 2023-02-18 ENCOUNTER — Telehealth: Payer: Self-pay | Admitting: Family Medicine

## 2023-02-18 NOTE — Telephone Encounter (Signed)
Lm for patient to pick up.

## 2023-02-25 LAB — COLOGUARD: COLOGUARD: NEGATIVE

## 2023-02-26 ENCOUNTER — Other Ambulatory Visit: Payer: Self-pay | Admitting: Family Medicine

## 2023-02-27 DIAGNOSIS — E7800 Pure hypercholesterolemia, unspecified: Secondary | ICD-10-CM | POA: Diagnosis not present

## 2023-02-27 DIAGNOSIS — E139 Other specified diabetes mellitus without complications: Secondary | ICD-10-CM | POA: Diagnosis not present

## 2023-02-27 DIAGNOSIS — E039 Hypothyroidism, unspecified: Secondary | ICD-10-CM | POA: Diagnosis not present

## 2023-02-27 DIAGNOSIS — I10 Essential (primary) hypertension: Secondary | ICD-10-CM | POA: Diagnosis not present

## 2023-03-10 ENCOUNTER — Other Ambulatory Visit (HOSPITAL_COMMUNITY): Payer: Self-pay

## 2023-03-11 NOTE — Telephone Encounter (Signed)
Submitted application for OZEMPIC, TRESIBA, & NOVOLOG FLEXPENS to [place] for patient assistance via online portal.   Phone: [phone]

## 2023-03-25 ENCOUNTER — Telehealth: Payer: Self-pay | Admitting: Pharmacist

## 2023-03-25 DIAGNOSIS — E1165 Type 2 diabetes mellitus with hyperglycemia: Secondary | ICD-10-CM

## 2023-03-25 MED ORDER — TRESIBA FLEXTOUCH 100 UNIT/ML ~~LOC~~ SOPN: 11.0000 [IU] | PEN_INJECTOR | Freq: Every day | SUBCUTANEOUS | Status: AC

## 2023-03-25 NOTE — Telephone Encounter (Signed)
    03/25/2023 Name: PINCHAS DECLEENE MRN: [id number] DOB: May 30, 1951   Levemir to [facility] Aas switch via [employer] patient assistance.  Rosendo Gros, CPhT assisting with process.  Continue other medications as directed.  Levemir is no longer being manufactured.  [employer] will ship to PCP office.  Medication list updated.  Regina Eck, PharmD, [place] Clinical Pharmacist, [facility]

## 2023-03-30 NOTE — Telephone Encounter (Signed)
Received notification from [employer] regarding approval for OZEMPIC 1MG  DOSE PENS TRESIBA U200, & NOVOLOG FLEXPENS Patient assistance approved until 12/22/23.  Medication in process of shipping to office.  Phone: [phone]  Apologies, I forgot to add his pen needles to the application!! Raynelle Fanning, could I email you the dose change app with pen needles? Or do you all have any available?

## 2023-04-10 ENCOUNTER — Encounter: Payer: Self-pay | Admitting: Urology

## 2023-04-10 ENCOUNTER — Ambulatory Visit: Payer: Medicare Other | Admitting: Urology

## 2023-04-10 VITALS — BP 131/69 | HR 69

## 2023-04-10 DIAGNOSIS — R972 Elevated prostate specific antigen [PSA]: Secondary | ICD-10-CM

## 2023-04-10 LAB — URINALYSIS, ROUTINE W REFLEX MICROSCOPIC
Bilirubin, UA: NEGATIVE
Glucose, UA: NEGATIVE
Leukocytes,UA: NEGATIVE
Nitrite, UA: NEGATIVE
Protein,UA: NEGATIVE
RBC, UA: NEGATIVE
Specific Gravity, UA: 1.02 (ref 1.005–1.030)
Urobilinogen, Ur: 0.2 mg/dL (ref 0.2–1.0)
pH, UA: 5.5 (ref 5.0–7.5)

## 2023-04-10 NOTE — Progress Notes (Unsigned)
04/10/2023 9:21 AM   Jeffrey Shaw 1951-04-13 [id number]  Referring provider: Sonny Masters, FNP [address] [place],  [place] [address]  Followup elevated PSA   HPI: No recent PSA.    PMH: Past Medical History:  Diagnosis Date   Allergy morphine   Arthritis    CAD (coronary artery disease)    CABG 01/2008 /  Catheterization, November, 2012, LIMA to the LAD patent,  70% distal left main stenosis involving the proximal circumflex, DES to the left main continuing into the circumflex   CORONARY ARTERY BYPASS GRAFT, HX OF 01/17/2010   Qualifier: Diagnosis of  By: Myrtis Ser, MD, [facility], Lemmie Evens    Diabetes mellitus    Dr. Nonie Hoyer, [facility]   GERD (gastroesophageal reflux disease)    HTN (hypertension)    Hx of CABG    2009, LIMA to LAD, right radial to PDA, SVG to diagonal, SVG to OM1   Hyperlipemia    Hypothyroid    Primary osteoarthritis of left hip 04/18/2016    Surgical History: Past Surgical History:  Procedure Laterality Date   BACK SURGERY     CARPAL TUNNEL RELEASE     left hand   CATARACT EXTRACTION Bilateral    CORONARY ANGIOPLASTY     CORONARY ARTERY BYPASS GRAFT  01/2008   X 6   INGUINAL HERNIA REPAIR     right   INGUINAL HERNIA REPAIR Left 09/13/2021   Procedure: HERNIA REPAIR INGUINAL ADULT W/MESH;  Surgeon: [name], MD;  Location: [facility];  Service: General;  Laterality: Left;   JOINT REPLACEMENT  hip   LEFT HEART CATHETERIZATION WITH CORONARY/GRAFT ANGIOGRAM N/A 10/30/2011   Procedure: LEFT HEART CATHETERIZATION WITH Isabel Caprice;  Surgeon: Herby Abraham, MD;  Location: [facility] CATH LAB;  Service: Cardiovascular;  Laterality: N/A;   LUMBAR DISC SURGERY     removed   PERCUTANEOUS CORONARY STENT INTERVENTION (PCI-S) N/A 11/03/2011   Procedure: PERCUTANEOUS CORONARY STENT INTERVENTION (PCI-S);  Surgeon: Kathleene Hazel, MD;  Location: [facility] CATH LAB;  Service: Cardiovascular;  Laterality: N/A;   TONSILLECTOMY  ~ 1955   TOTAL HIP  ARTHROPLASTY Left 04/18/2016   Procedure: TOTAL HIP ARTHROPLASTY;  Surgeon: Frederico Hamman, MD;  Location: [facility];  Service: Orthopedics;  Laterality: Left;   UMBILICAL HERNIA REPAIR N/A 09/13/2021   Procedure: HERNIA REPAIR UMBILICAL ADULT W/MESH;  Surgeon: [name], MD;  Location: [facility];  Service: General;  Laterality: N/A;   URETEROLITHOTOMY Left    with stone basket removal   XI ROBOTIC ASSISTED INGUINAL HERNIA REPAIR WITH MESH Left 10/30/2022   Procedure: XI ROBOTIC ASSISTED INGUINAL HERNIA REPAIR WITH MESH;  Surgeon: [name], MD;  Location: [facility];  Service: General;  Laterality: Left;    Home Medications:  Allergies as of 04/10/2023       Reactions   Morphine And Related Nausea Only        Medication List        Accurate as of April 10, 2023  9:21 AM. If you have any questions, ask your nurse or doctor.          aspirin EC 81 MG tablet Take 81 mg by mouth daily. Swallow whole.   atorvastatin 40 MG tablet Commonly known as: LIPITOR Take 1 tablet (40 mg total) by mouth daily.   B-12 PO Take 1 tablet by mouth daily.   Dexcom G6 Receiver Devi 1 Device by Does not apply route daily.   Dexcom G6 Sensor Misc APPLY  A NEW SENSOR EVERY 10 DAYS   Dexcom G6 Transmitter Misc USE EVERY 3 MONTHS   famotidine 20 MG tablet Commonly known as: PEPCID Take 20 mg by mouth 2 (two) times daily.   finasteride 5 MG tablet Commonly known as: PROSCAR Take 1 tablet (5 mg total) by mouth daily.   fluticasone 50 MCG/ACT nasal spray Commonly known as: FLONASE Place 1 spray into both nostrils daily.   HYDROcodone-acetaminophen 5-325 MG tablet Commonly known as: Norco Take 1 tablet by mouth every 6 (six) hours as needed for moderate pain.   insulin aspart 100 UNIT/ML FlexPen Commonly known as: NOVOLOG Inject 10-18 Units into the skin 3 (three) times daily before meals.   Insulin Pen Needle 29G X Misc Use to inject insulin up to 6 times daily as directed. DX  E11.65   levothyroxine 175 MCG tablet Commonly known as: SYNTHROID Take 1 tablet (175 mcg total) by mouth daily before breakfast.   linaclotide 72 MCG capsule Commonly known as: Linzess Take 1 capsule (72 mcg total) by mouth daily before breakfast.   lisinopril 10 MG tablet Commonly known as: ZESTRIL TAKE 1 TABLET BY MOUTH DAILY. (GENERIC FOR ZESTRIL)   MAGNESIUM PO Take 1 tablet by mouth daily.   metFORMIN 500 MG 24 hr tablet Commonly known as: GLUCOPHAGE-XR Take 1 tablet (500 mg total) by mouth daily with breakfast.   Ozempic (1 MG/DOSE) 4 MG/3ML Sopn Generic drug: Semaglutide (1 MG/DOSE) Inject 1 mg into the skin every Sunday.   sildenafil 100 MG tablet Commonly known as: VIAGRA TAKE 1 TABLET BY MOUTH ONCE DAILY AS NEEDED FOR ERECTILE DYSFUNCTION   Tresiba FlexTouch 100 UNIT/ML FlexTouch Pen Generic drug: insulin degludec Inject 11 Units into the skin daily. Gets via novo nordisk patient assistance   VITAMIN D3 PO Take 1 tablet by mouth daily.        Allergies:  Allergies  Allergen Reactions   Morphine And Related Nausea Only    Family History: Family History  Problem Relation Age of Onset   Dementia Mother    Liver disease Mother    Suicidality Father    Emphysema Father    Cancer Father    Drug abuse Brother        Clean now   Rheum arthritis Sister    Heart attack Maternal Grandmother    Emphysema Maternal Grandfather    Stroke Maternal Grandfather    Stomach cancer Paternal Grandmother    Diabetes Paternal Aunt    Diabetes Paternal Aunt     Social History:  reports that he quit smoking about 54 years ago. His smoking use included cigarettes. He has a 4.00 pack-year smoking history. He has never used smokeless tobacco. He reports current alcohol use. He reports that he does not use drugs.  ROS: All other review of systems were reviewed and are negative except what is noted above in HPI  Physical Exam: BP 131/69   Pulse 69    Constitutional:  Alert and oriented, No acute distress. HEENT: [place] AT, moist mucus membranes.  Trachea midline, no masses. Cardiovascular: No clubbing, cyanosis, or edema. Respiratory: Normal respiratory effort, no increased work of breathing. GI: Abdomen is soft, nontender, nondistended, no abdominal masses GU: No CVA tenderness.  Lymph: No cervical or inguinal lymphadenopathy. Skin: No rashes, bruises or suspicious lesions. Neurologic: Grossly intact, no focal deficits, moving all 4 extremities. Psychiatric: Normal mood and affect.  Laboratory Data: Lab Results  Component Value Date   WBC 8.1 02/06/2023  HGB 14.7 02/06/2023   HCT 44.3 02/06/2023   MCV 93 02/06/2023   PLT 298 02/06/2023    Lab Results  Component Value Date   CREATININE 1.21 02/06/2023    No results found for: "PSA"  No results found for: "TESTOSTERONE"  Lab Results  Component Value Date   HGBA1C 6.4 (H) 10/07/2022    Urinalysis    Component Value Date/Time   COLORURINE YELLOW 04/07/2016 1008   APPEARANCEUR Clear 09/05/2022 1118   LABSPEC 1.012 04/07/2016 1008   PHURINE 5.5 04/07/2016 1008   GLUCOSEU Negative 09/05/2022 1118   HGBUR NEGATIVE 04/07/2016 1008   BILIRUBINUR neg 10/03/2022 1228   BILIRUBINUR Negative 09/05/2022 1118   KETONESUR NEGATIVE 04/07/2016 1008   PROTEINUR Negative 10/03/2022 1228   PROTEINUR Negative 09/05/2022 1118   PROTEINUR NEGATIVE 04/07/2016 1008   UROBILINOGEN 0.2 10/03/2022 1228   UROBILINOGEN 1.0 01/31/2008 0840   NITRITE neg 10/03/2022 1228   NITRITE Negative 09/05/2022 1118   NITRITE NEGATIVE 04/07/2016 1008   LEUKOCYTESUR Negative 10/03/2022 1228   LEUKOCYTESUR Negative 09/05/2022 1118    Lab Results  Component Value Date   LABMICR 6.1 02/06/2023   WBCUA None seen 03/03/2022   LABEPIT None seen 03/03/2022   BACTERIA None seen 03/03/2022    Pertinent Imaging: *** Results for orders placed during the hospital encounter of 05/27/21  DG Abd 1  View  Narrative CLINICAL DATA:  LEFT lower quadrant abdominal pain, on and off constipation and diarrhea  EXAM: ABDOMEN - 1 VIEW  COMPARISON:  None  FINDINGS: Normal bowel gas pattern.  No bowel dilatation or bowel wall thickening.  Degenerative disc disease changes thoracolumbar spine with minimal levoconvex scoliosis.  LEFT hip prosthesis.  No urinary tract calcification.  Lung bases clear.  IMPRESSION: Normal bowel gas pattern.   Electronically Signed By: Ulyses Southward M.D. On: 05/28/2021 09:26  No results found for this or any previous visit.  No results found for this or any previous visit.  No results found for this or any previous visit.  No results found for this or any previous visit.  No valid procedures specified. No results found for this or any previous visit.  No results found for this or any previous visit.   Assessment & Plan:    1. Elevated PSA PSA today, if stable followup 6 months with PSA - Urinalysis, Routine w reflex microscopic - PSA - PSA, total and free; Future   No follow-ups on file.  Wilkie Aye, MD  [facility] [place]

## 2023-04-10 NOTE — Patient Instructions (Signed)

## 2023-04-11 LAB — PSA: Prostate Specific Ag, Serum: 4.3 ng/mL — ABNORMAL HIGH (ref 0.0–4.0)

## 2023-04-13 DIAGNOSIS — M25561 Pain in right knee: Secondary | ICD-10-CM | POA: Diagnosis not present

## 2023-04-28 ENCOUNTER — Other Ambulatory Visit: Payer: Self-pay | Admitting: Family Medicine

## 2023-04-28 DIAGNOSIS — E1165 Type 2 diabetes mellitus with hyperglycemia: Secondary | ICD-10-CM

## 2023-05-25 ENCOUNTER — Other Ambulatory Visit: Payer: Self-pay | Admitting: Family Medicine

## 2023-05-25 ENCOUNTER — Encounter: Payer: Self-pay | Admitting: Family Medicine

## 2023-05-25 NOTE — Telephone Encounter (Signed)
Cc'd to julie.  Suspect he needs reup of application.

## 2023-06-09 ENCOUNTER — Ambulatory Visit (INDEPENDENT_AMBULATORY_CARE_PROVIDER_SITE_OTHER): Payer: Medicare Other

## 2023-06-09 VITALS — Ht 76.0 in | Wt 231.0 lb

## 2023-06-09 DIAGNOSIS — Z0000 Encounter for general adult medical examination without abnormal findings: Secondary | ICD-10-CM | POA: Diagnosis not present

## 2023-06-09 NOTE — Patient Instructions (Signed)
Jeffrey Shaw , Thank you for taking time to come for your Medicare Wellness Visit. I appreciate your ongoing commitment to your health goals. Please review the following plan we discussed and let me know if I can assist you in the future.   These are the goals we discussed:  Goals       AWV      06/04/2022 AWV Goal: Diabetes Management  Patient will maintain an A1C level below 8.0 Patient will not develop any diabetic foot complications Patient will not experience any hypoglycemic episodes over the next 3 months Patient will notify our office of any CBG readings outside of the provider recommended range by calling [phone] Patient will adhere to provider recommendations for diabetes management  Patient Self Management Activities take all medications as prescribed and report any negative side effects monitor and record blood sugar readings as directed adhere to a low carbohydrate diet that incorporates lean proteins, vegetables, whole grains, low glycemic fruits check feet daily noting any sores, cracks, injuries, or callous formations see PCP or podiatrist if he notices any changes in his legs, feet, or toenails Patient will visit PCP and have an A1C level checked every 3 to 6 months as directed  have a yearly eye exam to monitor for vascular changes associated with diabetes and will request that the report be sent to his pcp.  consult with his PCP regarding any changes in his health or new or worsening symptoms       DIET - INCREASE WATER INTAKE      T2DM PHARMD GOAL (pt-stated)      Current Barriers:  Unable to independently afford treatment regimen  Pharmacist Clinical Goal(s):  patient will verbalize ability to afford treatment regimen through collaboration with PharmD and provider.    Interventions: 1:1 collaboration with Gwenlyn Fudge, FNP regarding development and update of comprehensive plan of care as evidenced by provider attestation and  co-signature Inter-disciplinary care team collaboration (see longitudinal plan of care) Comprehensive medication review performed; medication list updated in electronic medical record  Diabetes: New goal. Uncontrolled--patient is currently seeing endocrine for diabetes care; suggested omnipod? 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 Submitted for new medications via novo nordisk PAP; patient already enrolled for ozempic   Patient Goals/Self-Care Activities patient will:  - collaborate with provider on medication access solutions         This is a list of the screening recommended for you and due dates:  Health Maintenance  Topic Date Due   Complete foot exam   07/19/2022   DTaP/Tdap/Td vaccine (3 - Td or Tdap) 03/15/2023   Hemoglobin A1C  04/08/2023   Flu Shot  07/23/2023   Yearly kidney function blood test for diabetes  02/07/2024   Yearly kidney health urinalysis for diabetes  02/07/2024   Eye exam for diabetics  02/18/2024   Medicare Annual Wellness Visit  06/08/2024   Cologuard (Stool DNA test)  02/15/2026   Pneumonia Vaccine  Completed   Hepatitis C Screening  Completed   Zoster (Shingles) Vaccine  Completed   HPV Vaccine  Aged Out   COVID-19 Vaccine  Discontinued    Advanced directives: In Chart   Conditions/risks identified: Aim for 30 minutes of exercise or brisk walking, 6-8 glasses of water,  and 5 servings of fruits and vegetables each day.   Next appointment: Follow up in one year for your annual wellness visit.   Preventive Care 25 Years and Older, Male  Preventive care refers to lifestyle choices and visits with your health care provider that can promote health and wellness. What does  preventive care include? A yearly physical exam. This is also called an annual well check. Dental exams once or twice a year. Routine eye exams. Ask your health care provider how often you should have your eyes checked. Personal lifestyle choices, including: Daily care of your teeth and gums. Regular physical activity. Eating a healthy diet. Avoiding tobacco and drug use. Limiting alcohol use. Practicing safe sex. Taking low doses of aspirin every day. Taking vitamin and mineral supplements as recommended by your health care provider. What happens during an annual well check? The services and screenings done by your health care provider during your annual well check will depend on your age, overall health, lifestyle risk factors, and family history of disease. Counseling  Your health care provider may ask you questions about your: Alcohol use. Tobacco use. Drug use. Emotional well-being. Home and relationship well-being. Sexual activity. Eating habits. History of falls. Memory and ability to understand (cognition). Work and work Astronomer. Screening  You may have the following tests or measurements: Height, weight, and BMI. Blood pressure. Lipid and cholesterol levels. These may be checked every 5 years, or more frequently if you are over 74 years old. Skin check. Lung cancer screening. You may have this screening every year starting at age 54 if you have a 30-pack-year history of smoking and currently smoke or have quit within the past 15 years. Fecal occult blood test (FOBT) of the stool. You may have this test every year starting at age 71. Flexible sigmoidoscopy or colonoscopy. You may have a sigmoidoscopy every 5 years or a colonoscopy every 10 years starting at age 37. Prostate cancer screening. Recommendations will vary depending on your family history and other risks. Hepatitis C blood test. Hepatitis B blood test. Sexually transmitted disease (STD) testing. Diabetes  screening. This is done by checking your blood sugar (glucose) after you have not eaten for a while (fasting). You may have this done every 1-3 years. Abdominal aortic aneurysm (AAA) screening. You may need this if you are a current or former smoker. Osteoporosis. You may be screened starting at age 52 if you are at high risk. Talk with your health care provider about your test results, treatment options, and if necessary, the need for more tests. Vaccines  Your health care provider may recommend certain vaccines, such as: Influenza vaccine. This is recommended every year. Tetanus, diphtheria, and acellular pertussis (Tdap, Td) vaccine. You may need a Td booster every 10 years. Zoster vaccine. You may need this after age 12. Pneumococcal 13-valent conjugate (PCV13) vaccine. One dose is recommended after age 30. Pneumococcal polysaccharide (PPSV23) vaccine. One dose is recommended after age 44. Talk to your health care provider about which screenings and vaccines you need and how often you need them. This information is not intended to replace advice given to you by your health care provider. Make sure you discuss any questions you have with your health care provider. Document Released: 01/04/2016 Document Revised: 08/27/2016 Document Reviewed: 10/09/2015 Elsevier Interactive Patient Education  2017 [employer].  Fall Prevention in the Home Falls can cause injuries. They can happen to people of all ages. There are many things you can do to make  your home safe and to help prevent falls. What can I do on the outside of my home? Regularly fix the edges of walkways and driveways and fix any cracks. Remove anything that might make you trip as you walk through a door, such as a raised step or threshold. Trim any bushes or trees on the path to your home. Use bright outdoor lighting. Clear any walking paths of anything that might make someone trip, such as rocks or tools. Regularly check to see if  handrails are loose or broken. Make sure that both sides of any steps have handrails. Any raised decks and porches should have guardrails on the edges. Have any leaves, snow, or ice cleared regularly. Use sand or salt on walking paths during winter. Clean up any spills in your garage right away. This includes oil or grease spills. What can I do in the bathroom? Use night lights. Install grab bars by the toilet and in the tub and shower. Do not use towel bars as grab bars. Use non-skid mats or decals in the tub or shower. If you need to sit down in the shower, use a plastic, non-slip stool. Keep the floor dry. Clean up any water that spills on the floor as soon as it happens. Remove soap buildup in the tub or shower regularly. Attach bath mats securely with double-sided non-slip rug tape. Do not have throw rugs and other things on the floor that can make you trip. What can I do in the bedroom? Use night lights. Make sure that you have a light by your bed that is easy to reach. Do not use any sheets or blankets that are too big for your bed. They should not hang down onto the floor. Have a firm chair that has side arms. You can use this for support while you get dressed. Do not have throw rugs and other things on the floor that can make you trip. What can I do in the kitchen? Clean up any spills right away. Avoid walking on wet floors. Keep items that you use a lot in easy-to-reach places. If you need to reach something above you, use a strong step stool that has a grab bar. Keep electrical cords out of the way. Do not use floor polish or wax that makes floors slippery. If you must use wax, use non-skid floor wax. Do not have throw rugs and other things on the floor that can make you trip. What can I do with my stairs? Do not leave any items on the stairs. Make sure that there are handrails on both sides of the stairs and use them. Fix handrails that are broken or loose. Make sure that  handrails are as long as the stairways. Check any carpeting to make sure that it is firmly attached to the stairs. Fix any carpet that is loose or worn. Avoid having throw rugs at the top or bottom of the stairs. If you do have throw rugs, attach them to the floor with carpet tape. Make sure that you have a light switch at the top of the stairs and the bottom of the stairs. If you do not have them, ask someone to add them for you. What else can I do to help prevent falls? Wear shoes that: Do not have high heels. Have rubber bottoms. Are comfortable and fit you well. Are closed at the toe. Do not wear sandals. If you use a stepladder: Make sure that it is fully opened. Do not climb  a closed stepladder. Make sure that both sides of the stepladder are locked into place. Ask someone to hold it for you, if possible. Clearly mark and make sure that you can see: Any grab bars or handrails. First and last steps. Where the edge of each step is. Use tools that help you move around (mobility aids) if they are needed. These include: Canes. Walkers. Scooters. Crutches. Turn on the lights when you go into a dark area. Replace any light bulbs as soon as they burn out. Set up your furniture so you have a clear path. Avoid moving your furniture around. If any of your floors are uneven, fix them. If there are any pets around you, be aware of where they are. Review your medicines with your doctor. Some medicines can make you feel dizzy. This can increase your chance of falling. Ask your doctor what other things that you can do to help prevent falls. This information is not intended to replace advice given to you by your health care provider. Make sure you discuss any questions you have with your health care provider. Document Released: 10/04/2009 Document Revised: 05/15/2016 Document Reviewed: 01/12/2015 Elsevier Interactive Patient Education  2017 [employer].

## 2023-06-09 NOTE — Progress Notes (Addendum)
Subjective:   Jeffrey Shaw is a 72 y.o. male who presents for Medicare Annual/Subsequent preventive examination.  Visit Complete: Virtual  I connected with  Jeffrey Shaw on 06/09/23 by a audio enabled telemedicine application and verified that I am speaking with the correct person using two identifiers.  Patient Location: Home  Provider Location: Home Office  I discussed the limitations of evaluation and management by telemedicine. The patient expressed understanding and agreed to proceed.  Patient Medicare AWV questionnaire was completed by the patient on 06/09/2023; I have confirmed that all information answered by patient is correct and no changes since this date.  Review of Systems     Cardiac Risk Factors include: advanced age (>73men, >9 women);diabetes mellitus;male gender;dyslipidemia;hypertension     Objective:    Today's Vitals   06/09/23 1350  Weight: 231 lb (104.8 kg)  Height: 6\' 4"  (1.93 m)   Body mass index is 28.12 kg/m.     06/09/2023    1:53 PM 10/30/2022   11:43 AM 10/28/2022    1:02 PM 10/07/2022    2:32 PM 06/04/2022    2:54 PM 04/08/2022   11:07 AM 11/11/2021    7:47 AM  Advanced Directives  Does Patient Have a Medical Advance Directive? Yes Yes Yes No Yes No Yes  Type of Estate agent of [place];Living will Healthcare Power of [place];Living will Healthcare Power of [place];Living will  Healthcare Power of [place];Living will  Healthcare Power of [place];Living will  Does patient want to make changes to medical advance directive? No - Patient declined No - Patient declined No - Patient declined    No - Patient declined  Copy of Healthcare Power of Attorney in Chart? Yes - validated most recent copy scanned in chart (See row information) No - copy requested No - copy requested  Yes - validated most recent copy scanned in chart (See row information)  No - copy requested  Would patient like information on creating a medical  advance directive?   Yes (MAU/Ambulatory/Procedural Areas - Information given) No - Patient declined  No - Patient declined     Current Medications (verified) Outpatient Encounter Medications as of 06/09/2023  Medication Sig   aspirin EC 81 MG tablet Take 81 mg by mouth daily. Swallow whole.   atorvastatin (LIPITOR) 40 MG tablet Take 1 tablet (40 mg total) by mouth daily.   Continuous Blood Gluc Receiver (DEXCOM G6 RECEIVER) DEVI 1 Device by Does not apply route daily.   Continuous Blood Gluc Sensor (DEXCOM G6 SENSOR) MISC APPLY A NEW SENSOR EVERY 10 DAYS   Continuous Blood Gluc Transmit (DEXCOM G6 TRANSMITTER) MISC USE EVERY 3 MONTHS   famotidine (PEPCID) 20 MG tablet Take 20 mg by mouth 2 (two) times daily.   finasteride (PROSCAR) 5 MG tablet Take 1 tablet (5 mg total) by mouth daily.   fluticasone (FLONASE) 50 MCG/ACT nasal spray Place 1 spray into both nostrils daily.   insulin aspart (NOVOLOG) 100 UNIT/ML FlexPen Inject 10-18 Units into the skin 3 (three) times daily before meals.   insulin degludec (TRESIBA FLEXTOUCH) 100 UNIT/ML FlexTouch Pen Inject 11 Units into the skin daily. Gets via novo nordisk patient assistance   Insulin Pen Needle 29G X MISC Use to inject insulin up to 6 times daily as directed. DX E11.65   levothyroxine (SYNTHROID) 175 MCG tablet Take 1 tablet (175 mcg total) by mouth daily before breakfast.   lisinopril (ZESTRIL) 10 MG tablet TAKE 1 TABLET BY MOUTH  DAILY. (GENERIC FOR ZESTRIL)   MAGNESIUM PO Take 1 tablet by mouth daily.   metFORMIN (GLUCOPHAGE-XR) 500 MG 24 hr tablet Take 1 tablet by mouth once daily with breakfast   OZEMPIC, 1 MG/DOSE, 4 MG/3ML SOPN Inject 1 mg into the skin every Sunday.   sildenafil (VIAGRA) 100 MG tablet TAKE 1 TABLET BY MOUTH ONCE DAILY AS NEEDED FOR ERECTILE DYSFUNCTION   HYDROcodone-acetaminophen (NORCO) 5-325 MG tablet Take 1 tablet by mouth every 6 (six) hours as needed for moderate pain.   linaclotide (LINZESS) 72 MCG capsule  Take 1 capsule (72 mcg total) by mouth daily before breakfast.   No facility-administered encounter medications on file as of 06/09/2023.    Allergies (verified) Morphine and codeine   History: Past Medical History:  Diagnosis Date   Allergy morphine   Arthritis    CAD (coronary artery disease)    CABG 01/2008 /  Catheterization, November, 2012, LIMA to the LAD patent,  70% distal left main stenosis involving the proximal circumflex, DES to the left main continuing into the circumflex   CORONARY ARTERY BYPASS GRAFT, HX OF 01/17/2010   Qualifier: Diagnosis of  By: Myrtis Ser, MD, [facility], [place]    Diabetes mellitus    Dr. Nonie Hoyer, [facility]   GERD (gastroesophageal reflux disease)    HTN (hypertension)    Hx of CABG    2009, LIMA to LAD, right radial to PDA, SVG to diagonal, SVG to OM1   Hyperlipemia    Hypothyroid    Primary osteoarthritis of left hip 04/18/2016   Past Surgical History:  Procedure Laterality Date   BACK SURGERY     CARPAL TUNNEL RELEASE     left hand   CATARACT EXTRACTION Bilateral    CORONARY ANGIOPLASTY     CORONARY ARTERY BYPASS GRAFT  01/2008   X 6   INGUINAL HERNIA REPAIR     right   INGUINAL HERNIA REPAIR Left 09/13/2021   Procedure: HERNIA REPAIR INGUINAL ADULT W/MESH;  Surgeon: [name], MD;  Location: [facility];  Service: General;  Laterality: Left;   JOINT REPLACEMENT  hip   LEFT HEART CATHETERIZATION WITH CORONARY/GRAFT ANGIOGRAM N/A 10/30/2011   Procedure: LEFT HEART CATHETERIZATION WITH Isabel Caprice;  Surgeon: Herby Abraham, MD;  Location: [facility] CATH LAB;  Service: Cardiovascular;  Laterality: N/A;   LUMBAR DISC SURGERY     removed   PERCUTANEOUS CORONARY STENT INTERVENTION (PCI-S) N/A 11/03/2011   Procedure: PERCUTANEOUS CORONARY STENT INTERVENTION (PCI-S);  Surgeon: Kathleene Hazel, MD;  Location: [facility] CATH LAB;  Service: Cardiovascular;  Laterality: N/A;   TONSILLECTOMY  ~ 1955   TOTAL HIP ARTHROPLASTY Left 04/18/2016    Procedure: TOTAL HIP ARTHROPLASTY;  Surgeon: Frederico Hamman, MD;  Location: [facility] OR;  Service: Orthopedics;  Laterality: Left;   UMBILICAL HERNIA REPAIR N/A 09/13/2021   Procedure: HERNIA REPAIR UMBILICAL ADULT W/MESH;  Surgeon: [name], MD;  Location: [facility];  Service: General;  Laterality: N/A;   URETEROLITHOTOMY Left    with stone basket removal   XI ROBOTIC ASSISTED INGUINAL HERNIA REPAIR WITH MESH Left 10/30/2022   Procedure: XI ROBOTIC ASSISTED INGUINAL HERNIA REPAIR WITH MESH;  Surgeon: [name], MD;  Location: [facility];  Service: General;  Laterality: Left;   Family History  Problem Relation Age of Onset   Dementia Mother    Liver disease Mother    Suicidality Father    Emphysema Father    Cancer Father    Drug abuse Brother  Clean now   Rheum arthritis Sister    Heart attack Maternal Grandmother    Emphysema Maternal Grandfather    Stroke Maternal Grandfather    Stomach cancer Paternal Grandmother    Diabetes Paternal Aunt    Diabetes Paternal Aunt    Social History   Socioeconomic History   Marital status: Married    Spouse name: SANDRA   Number of children: 0   Years of education: Not on file   Highest education level: 12th grade  Occupational History   Occupation: SELF EMPLOYED    Comment: retired   Occupation: Marine scientist: [employer]    Comment: 5 hours/ 5 days per week  Tobacco Use   Smoking status: Former    Packs/day: 1.00    Years: 4.00    Additional pack years: 0.00    Total pack years: 4.00    Types: Cigarettes    Quit date: 12/22/1968    Years since quitting: 54.4   Smokeless tobacco: Never  Vaping Use   Vaping Use: Never used  Substance and Sexual Activity   Alcohol use: Yes    Comment: maybe one beer a month   Drug use: No   Sexual activity: Yes    Birth control/protection: None  Other Topics Concern   Not on file  Social History Narrative   Lives in one level home with his wife.   Very active - works at [employer], plays basketball, walks daily   Social Determinants of Health   Financial Resource Strain: Low Risk  (06/09/2023)   Overall Financial Resource Strain (CARDIA)    Difficulty of Paying Living Expenses: Not hard at all  Food Insecurity: No Food Insecurity (06/09/2023)   Hunger Vital Sign    Worried About Running Out of Food in the Last Year: Never true    Ran Out of Food in the Last Year: Never true  Transportation Needs: No Transportation Needs (06/09/2023)   PRAPARE - Administrator, Civil Service (Medical): No    Lack of Transportation (Non-Medical): No  Physical Activity: Sufficiently Active (06/09/2023)   Exercise Vital Sign    Days of Exercise per Week: 5 days    Minutes of Exercise per Session: 60 min  Stress: No Stress Concern Present (06/09/2023)   [employer] of Occupational Health - Occupational Stress Questionnaire    Feeling of Stress : Not at all  Social Connections: Socially Integrated (06/09/2023)   Social Connection and Isolation Panel [NHANES]    Frequency of Communication with Friends and Family: More than three times a week    Frequency of Social Gatherings with Friends and Family: More than three times a week    Attends Religious Services: More than 4 times per year    Active Member of [employer] or Organizations: Yes    Attends Engineer, structural: More than 4 times per year    Marital Status: Married    Tobacco Counseling Counseling given: Not Answered   Clinical Intake:  Pre-visit preparation completed: Yes  Pain : No/denies pain     Nutritional Risks: None Diabetes: Yes CBG done?: No Did pt. bring in CBG monitor from home?: No  How often do you need to have someone help you when you read instructions, pamphlets, or other written materials from your doctor or pharmacy?: 1 - Never  Interpreter Needed?: No  Information entered by :: Renie Ora, LPN   Activities of Daily Living    06/09/2023  1:53 PM 06/05/2023    11:33 AM  In your present state of health, do you have any difficulty performing the following activities:  Hearing? 0 0  Vision? 0 0  Difficulty concentrating or making decisions? 0 0  Walking or climbing stairs? 0 0  Dressing or bathing? 0 0  Doing errands, shopping? 0 0  Preparing Food and eating ? N N  Using the Toilet? N N  In the past six months, have you accidently leaked urine? N N  Do you have problems with loss of bowel control? N N  Managing your Medications? N N  Managing your Finances? N N  Housekeeping or managing your Housekeeping? N N    Patient Care Team: Sonny Masters, FNP as PCP - General (Family Medicine) Michaelle Copas, MD as Referring Physician (Optometry) Cresenciano Genre, Lilla Shook, [facility] as Pharmacist (Family Medicine) Dorisann Frames, MD as Referring Physician (Endocrinology) Ronne Binning Mardene Celeste, MD as Consulting Physician (Urology)  Indicate any recent Medical Services you may have received from other than [facility] providers in the past year (date may be approximate).     Assessment:   This is a routine wellness examination for Jeffrey Shaw.  Hearing/Vision screen Vision Screening - Comments:: Wears rx glasses - up to date with routine eye exams with  Dr.Lee   Dietary issues and exercise activities discussed:     Goals Addressed             This Visit's Progress    DIET - INCREASE WATER INTAKE         Depression Screen    06/09/2023    1:52 PM 02/06/2023   10:20 AM 08/06/2022   11:52 AM 06/04/2022    2:55 PM 01/24/2022   10:24 AM 12/10/2021   10:54 AM 07/19/2021   11:01 AM  PHQ 2/9 Scores  PHQ - 2 Score 0 0 0 0 0 0 0  PHQ- 9 Score  0 0  0 0 0    Fall Risk    06/09/2023    1:51 PM 06/05/2023   11:33 AM 02/06/2023   10:20 AM 08/06/2022   11:52 AM 06/04/2022    2:43 PM  Fall Risk   Falls in the past year? 0 0 0 0 1  Number falls in past yr: 0 0   0  Injury with Fall? 0 0   0  Risk for fall due to : No Fall Risks    History of fall(s);Orthopedic patient   Follow up Falls prevention discussed    Falls prevention discussed    MEDICARE RISK AT HOME:  Medicare Risk at Home - 06/09/23 1400     Any stairs in or around the home? Yes    If so, are there any without handrails? No    Home free of loose throw rugs in walkways, pet beds, electrical cords, etc? Yes    Adequate lighting in your home to reduce risk of falls? Yes    Life alert? Yes    Use of a cane, walker or w/c? No    Grab bars in the bathroom? Yes    Shower chair or bench in shower? Yes    Elevated toilet seat or a handicapped toilet? Yes             TIMED UP AND GO:  Was the test performed?  No    Cognitive Function:        06/09/2023    1:53 PM 06/04/2022  2:40 PM 05/28/2021   10:34 AM 04/04/2020    9:48 AM  6CIT Screen  What Year? 0 points 0 points 0 points 0 points  What month? 0 points 0 points 0 points 0 points  What time? 0 points 0 points 0 points 0 points  Count back from 20 0 points 0 points 0 points 0 points  Months in reverse 0 points 0 points 0 points 0 points  Repeat phrase 0 points 0 points 0 points 0 points  Total Score 0 points 0 points 0 points 0 points    Immunizations Immunization History  Administered Date(s) Administered   Influenza Split 10/07/2017   Influenza,inj,quad, With Preservative 09/24/2018, 10/10/2019   Influenza-Unspecified 10/13/2008, 10/06/2014, 10/05/2015, 09/24/2016, 09/24/2018, 10/05/2020   Pneumococcal Conjugate-13 10/06/2014   Pneumococcal Polysaccharide-23 10/09/1997, 01/07/2019   Td 09/02/2004   Tdap 03/14/2013   Zoster Recombinat (Shingrix) 09/28/2020, 03/07/2021   Zoster, Live 07/29/2013    TDAP status: Due, Education has been provided regarding the importance of this vaccine. Advised may receive this vaccine at local pharmacy or Health Dept. Aware to provide a copy of the vaccination record if obtained from local pharmacy or Health Dept. Verbalized acceptance and understanding.  Flu Vaccine status: Up to  date  Pneumococcal vaccine status: Up to date  Covid-19 vaccine status: Declined, Education has been provided regarding the importance of this vaccine but patient still declined. Advised may receive this vaccine at local pharmacy or Health Dept.or vaccine clinic. Aware to provide a copy of the vaccination record if obtained from local pharmacy or Health Dept. Verbalized acceptance and understanding.  Qualifies for Shingles Vaccine? Yes   Zostavax completed Yes   Shingrix Completed?: Yes  Screening Tests Health Maintenance  Topic Date Due   FOOT EXAM  07/19/2022   DTaP/Tdap/Td (3 - Td or Tdap) 03/15/2023   HEMOGLOBIN A1C  04/08/2023   INFLUENZA VACCINE  07/23/2023   Diabetic kidney evaluation - eGFR measurement  02/07/2024   Diabetic kidney evaluation - Urine ACR  02/07/2024   OPHTHALMOLOGY EXAM  02/18/2024   Medicare Annual Wellness (AWV)  06/08/2024   Fecal DNA (Cologuard)  02/15/2026   Pneumonia Vaccine 64+ Years old  Completed   Hepatitis C Screening  Completed   Zoster Vaccines- Shingrix  Completed   HPV VACCINES  Aged Out   COVID-19 Vaccine  Discontinued    Health Maintenance  Health Maintenance Due  Topic Date Due   FOOT EXAM  07/19/2022   DTaP/Tdap/Td (3 - Td or Tdap) 03/15/2023   HEMOGLOBIN A1C  04/08/2023    Colorectal cancer screening: Type of screening: Cologuard. Completed 02/025/2024. Repeat every 3 years  Lung Cancer Screening: (Low Dose CT Chest recommended if Age 44-80 years, 20 pack-year currently smoking OR have quit w/in 15years.) does not qualify.   Lung Cancer Screening Referral: n/a  Additional Screening:  Hepatitis C Screening: does not qualify; Completed 04/19/2018  Vision Screening: Recommended annual ophthalmology exams for early detection of glaucoma and other disorders of the eye. Is the patient up to date with their annual eye exam?  Yes  Who is the provider or what is the name of the office in which the patient attends annual eye exams?  Dr.Lee  If pt is not established with a provider, would they like to be referred to a provider to establish care? No .   Dental Screening: Recommended annual dental exams for proper oral hygiene  Diabetic Foot Exam: Diabetic Foot Exam: Overdue, Pt has been advised about the  importance in completing this exam. Pt is scheduled for diabetic foot exam on next office visit .  Community Resource Referral / Chronic Care Management: CRR required this visit?  No   CCM required this visit?  No     Plan:     I have personally reviewed and noted the following in the patient's chart:   Medical and social history Use of alcohol, tobacco or illicit drugs  Current medications and supplements including opioid prescriptions. Patient is not currently taking opioid prescriptions. Functional ability and status Nutritional status Physical activity Advanced directives List of other physicians Hospitalizations, surgeries, and ER visits in previous 12 months Vitals Screenings to include cognitive, depression, and falls Referrals and appointments  In addition, I have reviewed and discussed with patient certain preventive protocols, quality metrics, and best practice recommendations. A written personalized care plan for preventive services as well as general preventive health recommendations were provided to patient.     Lorrene Reid, LPN   [id number]/61/4431   After Visit Summary: (MyChart) Due to this being a telephonic visit, the after visit summary with patients personalized plan was offered to patient via MyChart   Nurse Notes: Due TDAP Vaccine

## 2023-07-07 DIAGNOSIS — K219 Gastro-esophageal reflux disease without esophagitis: Secondary | ICD-10-CM | POA: Diagnosis not present

## 2023-07-07 DIAGNOSIS — Z809 Family history of malignant neoplasm, unspecified: Secondary | ICD-10-CM | POA: Diagnosis not present

## 2023-07-07 DIAGNOSIS — E785 Hyperlipidemia, unspecified: Secondary | ICD-10-CM | POA: Diagnosis not present

## 2023-07-07 DIAGNOSIS — M1990 Unspecified osteoarthritis, unspecified site: Secondary | ICD-10-CM | POA: Diagnosis not present

## 2023-07-07 DIAGNOSIS — K5900 Constipation, unspecified: Secondary | ICD-10-CM | POA: Diagnosis not present

## 2023-07-07 DIAGNOSIS — Z7984 Long term (current) use of oral hypoglycemic drugs: Secondary | ICD-10-CM | POA: Diagnosis not present

## 2023-07-07 DIAGNOSIS — Z87891 Personal history of nicotine dependence: Secondary | ICD-10-CM | POA: Diagnosis not present

## 2023-07-07 DIAGNOSIS — I10 Essential (primary) hypertension: Secondary | ICD-10-CM | POA: Diagnosis not present

## 2023-07-08 ENCOUNTER — Other Ambulatory Visit: Payer: Self-pay | Admitting: Family Medicine

## 2023-07-08 DIAGNOSIS — E1159 Type 2 diabetes mellitus with other circulatory complications: Secondary | ICD-10-CM

## 2023-07-23 DIAGNOSIS — M25562 Pain in left knee: Secondary | ICD-10-CM | POA: Diagnosis not present

## 2023-07-23 DIAGNOSIS — M170 Bilateral primary osteoarthritis of knee: Secondary | ICD-10-CM | POA: Diagnosis not present

## 2023-07-27 ENCOUNTER — Other Ambulatory Visit: Payer: Self-pay | Admitting: Family Medicine

## 2023-07-27 DIAGNOSIS — E1165 Type 2 diabetes mellitus with hyperglycemia: Secondary | ICD-10-CM

## 2023-07-28 ENCOUNTER — Other Ambulatory Visit: Payer: Self-pay | Admitting: Family Medicine

## 2023-07-28 DIAGNOSIS — E039 Hypothyroidism, unspecified: Secondary | ICD-10-CM

## 2023-08-01 ENCOUNTER — Other Ambulatory Visit: Payer: Self-pay | Admitting: Family Medicine

## 2023-08-01 DIAGNOSIS — E1169 Type 2 diabetes mellitus with other specified complication: Secondary | ICD-10-CM

## 2023-08-01 DIAGNOSIS — E1165 Type 2 diabetes mellitus with hyperglycemia: Secondary | ICD-10-CM

## 2023-08-01 DIAGNOSIS — I700 Atherosclerosis of aorta: Secondary | ICD-10-CM

## 2023-08-01 DIAGNOSIS — I2510 Atherosclerotic heart disease of native coronary artery without angina pectoris: Secondary | ICD-10-CM

## 2023-08-07 ENCOUNTER — Encounter: Payer: Self-pay | Admitting: Family Medicine

## 2023-08-07 ENCOUNTER — Ambulatory Visit (INDEPENDENT_AMBULATORY_CARE_PROVIDER_SITE_OTHER): Payer: Medicare Other | Admitting: Family Medicine

## 2023-08-07 VITALS — BP 133/73 | HR 61 | Temp 97.3°F | Ht 76.0 in | Wt 231.4 lb

## 2023-08-07 DIAGNOSIS — E1169 Type 2 diabetes mellitus with other specified complication: Secondary | ICD-10-CM

## 2023-08-07 DIAGNOSIS — E785 Hyperlipidemia, unspecified: Secondary | ICD-10-CM

## 2023-08-07 DIAGNOSIS — Z794 Long term (current) use of insulin: Secondary | ICD-10-CM

## 2023-08-07 DIAGNOSIS — I152 Hypertension secondary to endocrine disorders: Secondary | ICD-10-CM | POA: Diagnosis not present

## 2023-08-07 DIAGNOSIS — E1159 Type 2 diabetes mellitus with other circulatory complications: Secondary | ICD-10-CM | POA: Diagnosis not present

## 2023-08-07 DIAGNOSIS — E039 Hypothyroidism, unspecified: Secondary | ICD-10-CM

## 2023-08-07 DIAGNOSIS — R5381 Other malaise: Secondary | ICD-10-CM

## 2023-08-07 DIAGNOSIS — R5383 Other fatigue: Secondary | ICD-10-CM

## 2023-08-07 DIAGNOSIS — E1165 Type 2 diabetes mellitus with hyperglycemia: Secondary | ICD-10-CM

## 2023-08-07 DIAGNOSIS — R7989 Other specified abnormal findings of blood chemistry: Secondary | ICD-10-CM

## 2023-08-07 DIAGNOSIS — N5201 Erectile dysfunction due to arterial insufficiency: Secondary | ICD-10-CM

## 2023-08-07 NOTE — Progress Notes (Signed)
Subjective:  Patient ID: Jeffrey Shaw, male    DOB: [birth year], 72 y.o.   MRN: [id number]  Patient Care Team: Sonny Masters, FNP as PCP - General (Family Medicine) Michaelle Copas, MD as Referring Physician (Optometry) Cresenciano Genre, Lilla Shook, [facility] as Pharmacist (Family Medicine) Dorisann Frames, MD as Referring Physician (Endocrinology) Ronne Binning Mardene Celeste, MD as Consulting Physician (Urology)   Chief Complaint:  Medical Management of Chronic Issues (6 month follow up )   HPI: Jeffrey Shaw is a 72 y.o. male presenting on 08/07/2023 for Medical Management of Chronic Issues (6 month follow up )   1. Hyperlipidemia associated with type 2 diabetes mellitus (HCC) Compliant with medications - Yes Current medications - lipitor Side effects from medications - No Diet - generally healthy Exercise - regular  2. Hypertension associated with type 2 diabetes mellitus (HCC) Complaint with meds - Yes Current Medications - lisinopril Checking BP at home ranging yes, 120-130/70/80 Exercising Regularly - Yes Watching Salt intake - Yes Pertinent ROS:  Headache - No Fatigue - Yes Visual Disturbances - No Chest pain - No Dyspnea - No Palpitations - No LE edema - No They report good compliance with medications and can restate their regimen by memory. No medication side effects.  BP Readings from Last 3 Encounters:  08/07/23 133/73  04/10/23 131/69  02/06/23 122/70    3. Acquired hypothyroidism Compliant with medications - Yes Current medications - levothyroxine Adverse side effects - No Weight - stable  Bowel habit changes - No Heat or cold intolerance - No Mood changes - No Changes in sleep habits - No Fatigue - Yes Skin, hair, or nail changes - No Tremor - No Palpitations - No Edema - No Shortness of breath - No  4. Controlled type 2 diabetes mellitus with hyperglycemia, with long-term current use of insulin (HCC) He is compliant with his medications and doing well. He is  followed by endocrinology and has an upcoming appointment scheduled. He is interested in the Omnipod. He will speak with his endocrinologist pertaining to this.  5. Malaise and fatigue 6. Erectile dysfunction due to arterial insufficiency Ongoing malaise and fatigue with ED. Has not had testosterone checked in the past and is concerned this may be contributing to his symptoms.      Relevant past medical, surgical, family, and social history reviewed and updated as indicated.  Allergies and medications reviewed and updated. Data reviewed: Chart in Epic.   Past Medical History:  Diagnosis Date   Allergy morphine   Arthritis    CAD (coronary artery disease)    CABG 01/2008 /  Catheterization, November, 2012, LIMA to the LAD patent,  70% distal left main stenosis involving the proximal circumflex, DES to the left main continuing into the circumflex   CORONARY ARTERY BYPASS GRAFT, HX OF 01/17/2010   Qualifier: Diagnosis of  By: Myrtis Ser, MD, [facility], Lemmie Evens    Diabetes mellitus    Dr. Nonie Hoyer, [facility]   GERD (gastroesophageal reflux disease)    HTN (hypertension)    Hx of CABG    2009, LIMA to LAD, right radial to PDA, SVG to diagonal, SVG to OM1   Hyperlipemia    Hypothyroid    Primary osteoarthritis of left hip 04/18/2016    Past Surgical History:  Procedure Laterality Date   BACK SURGERY     CARPAL TUNNEL RELEASE     left hand   CATARACT EXTRACTION Bilateral    CORONARY ANGIOPLASTY  CORONARY ARTERY BYPASS GRAFT  01/2008   X 6   INGUINAL HERNIA REPAIR     right   INGUINAL HERNIA REPAIR Left 09/13/2021   Procedure: HERNIA REPAIR INGUINAL ADULT W/MESH;  Surgeon: [name], MD;  Location: [facility]S;  Service: General;  Laterality: Left;   JOINT REPLACEMENT  hip   LEFT HEART CATHETERIZATION WITH CORONARY/GRAFT ANGIOGRAM N/A 10/30/2011   Procedure: LEFT HEART CATHETERIZATION WITH Isabel Caprice;  Surgeon: Herby Abraham, MD;  Location: [facility] CATH LAB;  Service:  Cardiovascular;  Laterality: N/A;   LUMBAR DISC SURGERY     removed   PERCUTANEOUS CORONARY STENT INTERVENTION (PCI-S) N/A 11/03/2011   Procedure: PERCUTANEOUS CORONARY STENT INTERVENTION (PCI-S);  Surgeon: Kathleene Hazel, MD;  Location: [facility] CATH LAB;  Service: Cardiovascular;  Laterality: N/A;   TONSILLECTOMY  ~ 1955   TOTAL HIP ARTHROPLASTY Left 04/18/2016   Procedure: TOTAL HIP ARTHROPLASTY;  Surgeon: Frederico Hamman, MD;  Location: [facility] OR;  Service: Orthopedics;  Laterality: Left;   UMBILICAL HERNIA REPAIR N/A 09/13/2021   Procedure: HERNIA REPAIR UMBILICAL ADULT W/MESH;  Surgeon: [name], MD;  Location: [facility];  Service: General;  Laterality: N/A;   URETEROLITHOTOMY Left    with stone basket removal   XI ROBOTIC ASSISTED INGUINAL HERNIA REPAIR WITH MESH Left 10/30/2022   Procedure: XI ROBOTIC ASSISTED INGUINAL HERNIA REPAIR WITH MESH;  Surgeon: [name], MD;  Location: [facility];  Service: General;  Laterality: Left;    Social History   Socioeconomic History   Marital status: Married    Spouse name: SANDRA   Number of children: 0   Years of education: Not on file   Highest education level: 12th grade  Occupational History   Occupation: SELF EMPLOYED    Comment: retired   Occupation: Marine scientist: [employer]    Comment: 5 hours/ 5 days per week  Tobacco Use   Smoking status: Former    Current packs/day: 0.00    Average packs/day: 1 pack/day for 4.0 years (4.0 ttl pk-yrs)    Types: Cigarettes    Start date: 12/22/1964    Quit date: 12/22/1968    Years since quitting: 54.6   Smokeless tobacco: Never  Vaping Use   Vaping status: Never Used  Substance and Sexual Activity   Alcohol use: Yes    Comment: maybe one beer a month   Drug use: No   Sexual activity: Yes    Birth control/protection: None  Other Topics Concern   Not on file  Social History Narrative   Lives in one level home with his wife.   Very active - works at [employer], plays basketball,  walks daily   Social Determinants of Health   Financial Resource Strain: Low Risk  (06/09/2023)   Overall Financial Resource Strain (CARDIA)    Difficulty of Paying Living Expenses: Not hard at all  Food Insecurity: No Food Insecurity (06/09/2023)   Hunger Vital Sign    Worried About Running Out of Food in the Last Year: Never true    Ran Out of Food in the Last Year: Never true  Transportation Needs: No Transportation Needs (06/09/2023)   PRAPARE - Administrator, Civil Service (Medical): No    Lack of Transportation (Non-Medical): No  Physical Activity: Sufficiently Active (06/09/2023)   Exercise Vital Sign    Days of Exercise per Week: 5 days    Minutes of Exercise per Session: 60 min  Stress: No Stress Concern  Present (06/09/2023)   [employer] of Occupational Health - Occupational Stress Questionnaire    Feeling of Stress : Not at all  Social Connections: Socially Integrated (06/09/2023)   Social Connection and Isolation Panel [NHANES]    Frequency of Communication with Friends and Family: More than three times a week    Frequency of Social Gatherings with Friends and Family: More than three times a week    Attends Religious Services: More than 4 times per year    Active Member of [employer] or Organizations: Yes    Attends Engineer, structural: More than 4 times per year    Marital Status: Married  Catering manager Violence: Not At Risk (06/09/2023)   Humiliation, Afraid, Rape, and Kick questionnaire    Fear of Current or Ex-Partner: No    Emotionally Abused: No    Physically Abused: No    Sexually Abused: No    Outpatient Encounter Medications as of 08/07/2023  Medication Sig   aspirin EC 81 MG tablet Take 81 mg by mouth daily. Swallow whole.   atorvastatin (LIPITOR) 40 MG tablet Take 1 tablet by mouth once daily   Continuous Blood Gluc Receiver (DEXCOM G6 RECEIVER) DEVI 1 Device by Does not apply route daily.   Continuous Blood Gluc Sensor (DEXCOM G6  SENSOR) MISC APPLY A NEW SENSOR EVERY 10 DAYS   Continuous Blood Gluc Transmit (DEXCOM G6 TRANSMITTER) MISC USE EVERY 3 MONTHS   famotidine (PEPCID) 20 MG tablet Take 20 mg by mouth 2 (two) times daily.   finasteride (PROSCAR) 5 MG tablet Take 1 tablet (5 mg total) by mouth daily.   fluticasone (FLONASE) 50 MCG/ACT nasal spray Place 1 spray into both nostrils daily.   insulin aspart (NOVOLOG) 100 UNIT/ML FlexPen Inject 10-18 Units into the skin 3 (three) times daily before meals.   insulin degludec (TRESIBA FLEXTOUCH) 100 UNIT/ML FlexTouch Pen Inject 11 Units into the skin daily. Gets via novo nordisk patient assistance   Insulin Pen Needle 29G X MISC Use to inject insulin up to 6 times daily as directed. DX E11.65   levothyroxine (SYNTHROID) 175 MCG tablet TAKE 1 TABLET BY MOUTH ONCE DAILY BEFORE BREAKFAST   lisinopril (ZESTRIL) 10 MG tablet Take 1 tablet by mouth once daily   MAGNESIUM PO Take 1 tablet by mouth daily.   metFORMIN (GLUCOPHAGE-XR) 500 MG 24 hr tablet Take 1 tablet by mouth once daily with breakfast   OZEMPIC, 1 MG/DOSE, 4 MG/3ML SOPN Inject 1 mg into the skin every Sunday.   sildenafil (VIAGRA) 100 MG tablet TAKE 1 TABLET BY MOUTH ONCE DAILY AS NEEDED FOR ERECTILE DYSFUNCTION   [DISCONTINUED] HYDROcodone-acetaminophen (NORCO) 5-325 MG tablet Take 1 tablet by mouth every 6 (six) hours as needed for moderate pain.   [DISCONTINUED] linaclotide (LINZESS) 72 MCG capsule Take 1 capsule (72 mcg total) by mouth daily before breakfast.   No facility-administered encounter medications on file as of 08/07/2023.    Allergies  Allergen Reactions   Morphine And Codeine Nausea Only    Review of Systems  Constitutional:  Positive for fatigue. Negative for activity change, appetite change, chills, diaphoresis, fever and unexpected weight change.  HENT: Negative.    Eyes: Negative.  Negative for photophobia and visual disturbance.  Respiratory:  Negative for cough, chest tightness and  shortness of breath.   Cardiovascular:  Negative for chest pain, palpitations and leg swelling.  Gastrointestinal:  Negative for abdominal distention, abdominal pain, anal bleeding, blood in stool, constipation, diarrhea, nausea, rectal  pain and vomiting.  Endocrine: Negative.  Negative for polydipsia, polyphagia and polyuria.  Genitourinary:  Negative for decreased urine volume, difficulty urinating, dysuria, frequency and urgency.  Musculoskeletal:  Negative for arthralgias and myalgias.  Skin: Negative.   Allergic/Immunologic: Negative.   Neurological:  Negative for dizziness, tremors, seizures, syncope, facial asymmetry, speech difficulty, weakness, light-headedness, numbness and headaches.  Hematological: Negative.   Psychiatric/Behavioral:  Negative for confusion, hallucinations, sleep disturbance and suicidal ideas.   All other systems reviewed and are negative.       Objective:  BP 133/73   Pulse 61   Temp (!) 97.3 F (36.3 C) (Temporal)   Ht 6\' 4"  (1.93 m)   Wt 231 lb 6.4 oz (105 kg)   SpO2 95%   BMI 28.17 kg/m    Wt Readings from Last 3 Encounters:  08/07/23 231 lb 6.4 oz (105 kg)  06/09/23 231 lb (104.8 kg)  02/06/23 231 lb (104.8 kg)    Physical Exam Vitals and nursing note reviewed.  Constitutional:      General: He is not in acute distress.    Appearance: Normal appearance. He is well-developed, well-groomed and overweight. He is not ill-appearing, toxic-appearing or diaphoretic.  HENT:     Head: Normocephalic and atraumatic.     Jaw: There is normal jaw occlusion.     Right Ear: Hearing normal.     Left Ear: Hearing normal.     Nose: Nose normal.     Mouth/Throat:     Lips: Pink.     Mouth: Mucous membranes are moist.     Pharynx: Oropharynx is clear. Uvula midline.  Eyes:     General: Lids are normal.     Extraocular Movements: Extraocular movements intact.     Conjunctiva/sclera: Conjunctivae normal.     Pupils: Pupils are equal, round, and  reactive to light.  Neck:     Trachea: Trachea and phonation normal.  Cardiovascular:     Rate and Rhythm: Normal rate and regular rhythm.     Chest Wall: PMI is not displaced.     Pulses: Normal pulses.     Heart sounds: Normal heart sounds. No murmur heard.    No friction rub. No gallop.  Pulmonary:     Effort: Pulmonary effort is normal. No respiratory distress.     Breath sounds: Normal breath sounds. No wheezing.  Musculoskeletal:        General: Normal range of motion.     Cervical back: Normal range of motion and neck supple.     Right lower leg: No edema.     Left lower leg: No edema.  Skin:    General: Skin is warm and dry.     Capillary Refill: Capillary refill takes less than 2 seconds.     Coloration: Skin is not cyanotic, jaundiced or pale.     Findings: No rash.  Neurological:     General: No focal deficit present.     Mental Status: He is alert and oriented to person, place, and time.     Sensory: Sensation is intact.     Motor: Motor function is intact.     Coordination: Coordination is intact.     Gait: Gait is intact.     Deep Tendon Reflexes: Reflexes are normal and symmetric.  Psychiatric:        Attention and Perception: Attention and perception normal.        Mood and Affect: Mood and affect normal.  Speech: Speech normal.        Behavior: Behavior normal. Behavior is cooperative.        Thought Content: Thought content normal.        Cognition and Memory: Cognition and memory normal.        Judgment: Judgment normal.     Results for orders placed or performed in visit on 04/10/23  Urinalysis, Routine w reflex microscopic  Result Value Ref Range   Specific Gravity, UA 1.020 1.005 - 1.030   pH, UA 5.5 5.0 - 7.5   Color, UA Yellow Yellow   Appearance Ur Clear Clear   Leukocytes,UA Negative Negative   Protein,UA Negative Negative/Trace   Glucose, UA Negative Negative   Ketones, UA Trace (A) Negative   RBC, UA Negative Negative   Bilirubin,  UA Negative Negative   Urobilinogen, Ur 0.2 0.2 - 1.0 mg/dL   Nitrite, UA Negative Negative   Microscopic Examination Comment   PSA  Result Value Ref Range   Prostate Specific Ag, Serum 4.3 (H) 0.0 - 4.0 ng/mL       Pertinent labs & imaging results that were available during my care of the patient were reviewed by me and considered in my medical decision making.  Assessment & Plan:  Nur was seen today for medical management of chronic issues.  Diagnoses and all orders for this visit:  Hyperlipidemia associated with type 2 diabetes mellitus (HCC) Diet encouraged - increase intake of fresh fruits and vegetables, increase intake of lean proteins. Bake, broil, or grill foods. Avoid fried, greasy, and fatty foods. Avoid fast foods. Increase intake of fiber-rich whole grains. Exercise encouraged - at least 150 minutes per week and advance as tolerated.  Goal BMI < 25. Continue medications as prescribed. Follow up in 3-6 months as discussed.  -     CMP14+EGFR -     Lipid panel  Hypertension associated with type 2 diabetes mellitus (HCC) BP well controlled. Changes were not made in regimen today. Goal BP is 130/80. Pt aware to report any persistent high or low readings. DASH diet and exercise encouraged. Exercise at least 150 minutes per week and increase as tolerated. Goal BMI > 25. Stress management encouraged. Avoid nicotine and tobacco product use. Avoid excessive alcohol and NSAID's. Avoid more than 2000 mg of sodium daily. Medications as prescribed. Follow up as scheduled.  -     CBC with Differential/Platelet -     CMP14+EGFR -     Thyroid Panel With TSH -     Lipid panel  Acquired hypothyroidism Thyroid disease has been well controlled. Labs are pending. Adjustments to regimen will be made if warranted. Make sure to take medications on an empty stomach with a full glass of water. Make sure to avoid vitamins or supplements for at least 4 hours before and 4 hours after taking  medications. Repeat labs in 3 months if adjustments are made and in 6 months if stable.   -     Thyroid Panel With TSH  Controlled type 2 diabetes mellitus with hyperglycemia, with long-term current use of insulin (HCC) Followed by endocrinology on a regular basis. Well controlled.  -     CBC with Differential/Platelet -     CMP14+EGFR -     Lipid panel  Malaise and fatigue Ongoing and worsening symptoms over the last several months. Will check for testosterone deficiency.  -     Testosterone,Free and Total  Erectile dysfunction due to arterial insufficiency Will check testosterone  today.  -     Testosterone,Free and Total     Continue all other maintenance medications.  Follow up plan: Return in about 6 months (around 02/07/2024) for CPE.   Continue healthy lifestyle choices, including diet (rich in fruits, vegetables, and lean proteins, and low in salt and simple carbohydrates) and exercise (at least 30 minutes of moderate physical activity daily).  Educational handout given for health maintenance   The above assessment and management plan was discussed with the patient. The patient verbalized understanding of and has agreed to the management plan. Patient is aware to call the clinic if they develop any new symptoms or if symptoms persist or worsen. Patient is aware when to return to the clinic for a follow-up visit. Patient educated on when it is appropriate to go to the emergency department.   Kari Baars, FNP-C [facility] [phone]

## 2023-08-10 NOTE — Addendum Note (Signed)
Addended by: Sonny Masters on: 08/10/2023 06:46 PM   Modules accepted: Orders

## 2023-08-15 LAB — CBC WITH DIFFERENTIAL/PLATELET
Basophils Absolute: 0.1 10*3/uL (ref 0.0–0.2)
Basos: 1 %
EOS (ABSOLUTE): 0.1 10*3/uL (ref 0.0–0.4)
Eos: 2 %
Hematocrit: 42.3 % (ref 37.5–51.0)
Hemoglobin: 14.3 g/dL (ref 13.0–17.7)
Immature Grans (Abs): 0.1 10*3/uL (ref 0.0–0.1)
Immature Granulocytes: 1 %
Lymphocytes Absolute: 2 10*3/uL (ref 0.7–3.1)
Lymphs: 26 %
MCH: 31.2 pg (ref 26.6–33.0)
MCHC: 33.8 g/dL (ref 31.5–35.7)
MCV: 92 fL (ref 79–97)
Monocytes Absolute: 0.5 10*3/uL (ref 0.1–0.9)
Monocytes: 6 %
Neutrophils Absolute: 4.9 10*3/uL (ref 1.4–7.0)
Neutrophils: 64 %
Platelets: 259 10*3/uL (ref 150–450)
RBC: 4.59 x10E6/uL (ref 4.14–5.80)
RDW: 12.2 % (ref 11.6–15.4)
WBC: 7.6 10*3/uL (ref 3.4–10.8)

## 2023-08-15 LAB — CMP14+EGFR
ALT: 37 IU/L (ref 0–44)
AST: 43 IU/L — ABNORMAL HIGH (ref 0–40)
Albumin: 4.3 g/dL (ref 3.8–4.8)
Alkaline Phosphatase: 78 IU/L (ref 44–121)
BUN/Creatinine Ratio: 16 (ref 10–24)
BUN: 20 mg/dL (ref 8–27)
Bilirubin Total: 1.9 mg/dL — ABNORMAL HIGH (ref 0.0–1.2)
CO2: 23 mmol/L (ref 20–29)
Calcium: 9.6 mg/dL (ref 8.6–10.2)
Chloride: 102 mmol/L (ref 96–106)
Creatinine, Ser: 1.27 mg/dL (ref 0.76–1.27)
Globulin, Total: 2.4 g/dL (ref 1.5–4.5)
Glucose: 134 mg/dL — ABNORMAL HIGH (ref 70–99)
Potassium: 5.1 mmol/L (ref 3.5–5.2)
Sodium: 135 mmol/L (ref 134–144)
Total Protein: 6.7 g/dL (ref 6.0–8.5)
eGFR: 60 mL/min/{1.73_m2} (ref >59)

## 2023-08-15 LAB — THYROID PANEL WITH TSH
Free Thyroxine Index: 2.7 (ref 1.2–4.9)
T3 Uptake Ratio: 30 % (ref 24–39)
T4, Total: 9 ug/dL (ref 4.5–12.0)
TSH: 5.64 u[IU]/mL — ABNORMAL HIGH (ref 0.450–4.500)

## 2023-08-15 LAB — LIPID PANEL
Chol/HDL Ratio: 2.1 ratio (ref 0.0–5.0)
Cholesterol, Total: 139 mg/dL (ref 100–199)
HDL: 66 mg/dL (ref >39)
LDL Chol Calc (NIH): 61 mg/dL (ref 0–99)
Triglycerides: 54 mg/dL (ref 0–149)
VLDL Cholesterol Cal: 12 mg/dL (ref 5–40)

## 2023-08-15 LAB — TESTOSTERONE,FREE AND TOTAL
Testosterone, Free: 2.5 pg/mL — ABNORMAL LOW (ref 6.6–18.1)
Testosterone: 535 ng/dL (ref 264–916)

## 2023-08-16 ENCOUNTER — Encounter: Payer: Self-pay | Admitting: Family Medicine

## 2023-08-18 ENCOUNTER — Other Ambulatory Visit: Payer: Self-pay

## 2023-08-18 ENCOUNTER — Telehealth: Payer: Self-pay | Admitting: Family Medicine

## 2023-08-18 ENCOUNTER — Ambulatory Visit (HOSPITAL_COMMUNITY)
Admission: RE | Admit: 2023-08-18 | Discharge: 2023-08-18 | Disposition: A | Discharge status: Home / Self Care | Payer: Medicare Other | Source: Ambulatory Visit | Attending: Family Medicine | Admitting: Family Medicine

## 2023-08-18 DIAGNOSIS — R7989 Other specified abnormal findings of blood chemistry: Secondary | ICD-10-CM | POA: Insufficient documentation

## 2023-08-18 DIAGNOSIS — R5383 Other fatigue: Secondary | ICD-10-CM

## 2023-08-18 DIAGNOSIS — R748 Abnormal levels of other serum enzymes: Secondary | ICD-10-CM | POA: Diagnosis not present

## 2023-08-18 DIAGNOSIS — K8020 Calculus of gallbladder without cholecystitis without obstruction: Secondary | ICD-10-CM | POA: Diagnosis not present

## 2023-08-18 DIAGNOSIS — E291 Testicular hypofunction: Secondary | ICD-10-CM

## 2023-08-18 NOTE — Telephone Encounter (Signed)
Lab orders placed in future

## 2023-08-19 ENCOUNTER — Telehealth: Payer: Self-pay | Admitting: Family Medicine

## 2023-08-21 ENCOUNTER — Encounter: Payer: Self-pay | Admitting: Family Medicine

## 2023-08-25 NOTE — Telephone Encounter (Signed)
Responded to pt via mychart message.

## 2023-08-28 ENCOUNTER — Other Ambulatory Visit: Payer: Self-pay | Admitting: Family Medicine

## 2023-08-28 ENCOUNTER — Encounter: Payer: Self-pay | Admitting: Pharmacist

## 2023-08-28 DIAGNOSIS — I152 Hypertension secondary to endocrine disorders: Secondary | ICD-10-CM

## 2023-09-04 DIAGNOSIS — E7800 Pure hypercholesterolemia, unspecified: Secondary | ICD-10-CM | POA: Diagnosis not present

## 2023-09-04 DIAGNOSIS — I10 Essential (primary) hypertension: Secondary | ICD-10-CM | POA: Diagnosis not present

## 2023-09-04 DIAGNOSIS — E039 Hypothyroidism, unspecified: Secondary | ICD-10-CM | POA: Diagnosis not present

## 2023-09-04 DIAGNOSIS — E139 Other specified diabetes mellitus without complications: Secondary | ICD-10-CM | POA: Diagnosis not present

## 2023-09-07 ENCOUNTER — Encounter: Payer: Self-pay | Admitting: Family Medicine

## 2023-09-11 ENCOUNTER — Other Ambulatory Visit: Payer: Medicare Other

## 2023-09-11 DIAGNOSIS — R5381 Other malaise: Secondary | ICD-10-CM

## 2023-09-11 DIAGNOSIS — E291 Testicular hypofunction: Secondary | ICD-10-CM

## 2023-09-11 DIAGNOSIS — R5383 Other fatigue: Secondary | ICD-10-CM | POA: Diagnosis not present

## 2023-09-16 LAB — TESTOSTERONE,FREE AND TOTAL
Testosterone, Free: 5.6 pg/mL — ABNORMAL LOW (ref 6.6–18.1)
Testosterone: 738 ng/dL (ref 264–916)

## 2023-09-17 ENCOUNTER — Other Ambulatory Visit: Payer: Self-pay | Admitting: Urology

## 2023-10-12 ENCOUNTER — Other Ambulatory Visit: Payer: Medicare Other

## 2023-10-12 DIAGNOSIS — R972 Elevated prostate specific antigen [PSA]: Secondary | ICD-10-CM | POA: Diagnosis not present

## 2023-10-13 LAB — PSA, TOTAL AND FREE
PSA, Free Pct: 19.5 %
PSA, Free: 0.82 ng/mL
Prostate Specific Ag, Serum: 4.2 ng/mL — ABNORMAL HIGH (ref 0.0–4.0)

## 2023-10-21 ENCOUNTER — Ambulatory Visit: Payer: Medicare Other | Admitting: Urology

## 2023-10-22 ENCOUNTER — Other Ambulatory Visit: Payer: Self-pay | Admitting: Family Medicine

## 2023-10-22 DIAGNOSIS — E039 Hypothyroidism, unspecified: Secondary | ICD-10-CM

## 2023-10-23 DIAGNOSIS — M170 Bilateral primary osteoarthritis of knee: Secondary | ICD-10-CM | POA: Diagnosis not present

## 2023-10-29 ENCOUNTER — Other Ambulatory Visit: Payer: Self-pay | Admitting: Family Medicine

## 2023-10-29 DIAGNOSIS — I2510 Atherosclerotic heart disease of native coronary artery without angina pectoris: Secondary | ICD-10-CM

## 2023-10-29 DIAGNOSIS — I700 Atherosclerosis of aorta: Secondary | ICD-10-CM

## 2023-10-29 DIAGNOSIS — E1169 Type 2 diabetes mellitus with other specified complication: Secondary | ICD-10-CM

## 2023-10-29 DIAGNOSIS — E1165 Type 2 diabetes mellitus with hyperglycemia: Secondary | ICD-10-CM

## 2023-12-17 ENCOUNTER — Other Ambulatory Visit: Payer: Self-pay | Admitting: Urology

## 2024-01-08 ENCOUNTER — Ambulatory Visit: Payer: Medicare Other | Admitting: Urology

## 2024-01-08 VITALS — BP 131/63 | HR 80

## 2024-01-08 DIAGNOSIS — R351 Nocturia: Secondary | ICD-10-CM | POA: Diagnosis not present

## 2024-01-08 DIAGNOSIS — R972 Elevated prostate specific antigen [PSA]: Secondary | ICD-10-CM | POA: Diagnosis not present

## 2024-01-08 LAB — URINALYSIS, ROUTINE W REFLEX MICROSCOPIC
Bilirubin, UA: NEGATIVE
Glucose, UA: NEGATIVE
Ketones, UA: NEGATIVE
Leukocytes,UA: NEGATIVE
Nitrite, UA: NEGATIVE
Protein,UA: NEGATIVE
RBC, UA: NEGATIVE
Specific Gravity, UA: 1.025 (ref 1.005–1.030)
Urobilinogen, Ur: 0.2 mg/dL (ref 0.2–1.0)
pH, UA: 6 (ref 5.0–7.5)

## 2024-01-08 MED ORDER — DUTASTERIDE 0.5 MG PO CAPS: 0.5000 mg | ORAL_CAPSULE | Freq: Every day | ORAL | 3 refills | Status: DC

## 2024-01-08 NOTE — Progress Notes (Signed)
01/08/2024 12:25 PM   Jeffrey Shaw 1951/11/24 [id number]  Referring provider: Sonny Masters, FNP [address] [place],  [place] [address]  Followup elevated PSA   HPI: Jeffrey Shaw is a 72yo here for followup for elevated PSA and nocturia. He is more fatigued on finasteride. His free testosterone is low. PSA decreased to 4.2 on finasteride. IPSS 3 QOL 0.  Urine stream strong. No straining to urinate. Nocturia 1-2x.    PMH: Past Medical History:  Diagnosis Date   Allergy morphine   Arthritis    CAD (coronary artery disease)    CABG 01/2008 /  Catheterization, November, 2012, LIMA to the LAD patent,  70% distal left main stenosis involving the proximal circumflex, DES to the left main continuing into the circumflex   CORONARY ARTERY BYPASS GRAFT, HX OF 01/17/2010   Qualifier: Diagnosis of  By: Myrtis Ser, MD, [facility], [place]    Diabetes mellitus    Dr. Nonie Hoyer, [facility]   GERD (gastroesophageal reflux disease)    HTN (hypertension)    Hx of CABG    2009, LIMA to LAD, right radial to PDA, SVG to diagonal, SVG to OM1   Hyperlipemia    Hypothyroid    Primary osteoarthritis of left hip 04/18/2016    Surgical History: Past Surgical History:  Procedure Laterality Date   BACK SURGERY     CARPAL TUNNEL RELEASE     left hand   CATARACT EXTRACTION Bilateral    CORONARY ANGIOPLASTY     CORONARY ARTERY BYPASS GRAFT  01/2008   X 6   INGUINAL HERNIA REPAIR     right   INGUINAL HERNIA REPAIR Left 09/13/2021   Procedure: HERNIA REPAIR INGUINAL ADULT W/MESH;  Surgeon: [name], MD;  Location: [facility];  Service: General;  Laterality: Left;   JOINT REPLACEMENT  hip   LEFT HEART CATHETERIZATION WITH CORONARY/GRAFT ANGIOGRAM N/A 10/30/2011   Procedure: LEFT HEART CATHETERIZATION WITH Isabel Caprice;  Surgeon: Herby Abraham, MD;  Location: [facility] CATH LAB;  Service: Cardiovascular;  Laterality: N/A;   LUMBAR DISC SURGERY     removed   PERCUTANEOUS CORONARY STENT INTERVENTION  (PCI-S) N/A 11/03/2011   Procedure: PERCUTANEOUS CORONARY STENT INTERVENTION (PCI-S);  Surgeon: Kathleene Hazel, MD;  Location: [facility] CATH LAB;  Service: Cardiovascular;  Laterality: N/A;   TONSILLECTOMY  ~ 1955   TOTAL HIP ARTHROPLASTY Left 04/18/2016   Procedure: TOTAL HIP ARTHROPLASTY;  Surgeon: Frederico Hamman, MD;  Location: [facility];  Service: Orthopedics;  Laterality: Left;   UMBILICAL HERNIA REPAIR N/A 09/13/2021   Procedure: HERNIA REPAIR UMBILICAL ADULT W/MESH;  Surgeon: [name], MD;  Location: [facility];  Service: General;  Laterality: N/A;   URETEROLITHOTOMY Left    with stone basket removal   XI ROBOTIC ASSISTED INGUINAL HERNIA REPAIR WITH MESH Left 10/30/2022   Procedure: XI ROBOTIC ASSISTED INGUINAL HERNIA REPAIR WITH MESH;  Surgeon: [name], MD;  Location: [facility];  Service: General;  Laterality: Left;    Home Medications:  Allergies as of 01/08/2024       Reactions   Morphine And Codeine Nausea Only        Medication List        Accurate as of January 08, 2024 12:25 PM. If you have any questions, ask your nurse or doctor.          aspirin EC 81 MG tablet Take 81 mg by mouth daily. Swallow whole.   atorvastatin 40 MG tablet Commonly known as: LIPITOR  Take 1 tablet by mouth once daily   Dexcom G6 Receiver Devi 1 Device by Does not apply route daily.   Dexcom G6 Sensor Misc APPLY A NEW SENSOR EVERY 10 DAYS   Dexcom G6 Transmitter Misc USE EVERY 3 MONTHS   famotidine 20 MG tablet Commonly known as: PEPCID Take 20 mg by mouth 2 (two) times daily.   finasteride 5 MG tablet Commonly known as: PROSCAR Take 1 tablet by mouth once daily   fluticasone 50 MCG/ACT nasal spray Commonly known as: FLONASE Place 1 spray into both nostrils daily.   insulin aspart 100 UNIT/ML FlexPen Commonly known as: NOVOLOG Inject 10-18 Units into the skin 3 (three) times daily before meals.   Insulin Pen Needle 29G X Misc Use to inject insulin up to 6 times  daily as directed. DX E11.65   levothyroxine 175 MCG tablet Commonly known as: SYNTHROID TAKE 1 TABLET BY MOUTH ONCE DAILY BEFORE BREAKFAST   lisinopril 10 MG tablet Commonly known as: ZESTRIL Take 1 tablet by mouth once daily   MAGNESIUM PO Take 1 tablet by mouth daily.   metFORMIN 500 MG 24 hr tablet Commonly known as: GLUCOPHAGE-XR Take 1 tablet by mouth once daily with breakfast   Ozempic (1 MG/DOSE) 4 MG/3ML Sopn Generic drug: Semaglutide (1 MG/DOSE) Inject 1 mg into the skin every Sunday.   sildenafil 100 MG tablet Commonly known as: VIAGRA TAKE 1 TABLET BY MOUTH ONCE DAILY AS NEEDED FOR ERECTILE DYSFUNCTION   Tresiba FlexTouch 100 UNIT/ML FlexTouch Pen Generic drug: insulin degludec Inject 11 Units into the skin daily. Gets via novo nordisk patient assistance        Allergies:  Allergies  Allergen Reactions   Morphine And Codeine Nausea Only    Family History: Family History  Problem Relation Age of Onset   Dementia Mother    Liver disease Mother    Suicidality Father    Emphysema Father    Cancer Father    Drug abuse Brother        Clean now   Rheum arthritis Sister    Heart attack Maternal Grandmother    Emphysema Maternal Grandfather    Stroke Maternal Grandfather    Stomach cancer Paternal Grandmother    Diabetes Paternal Aunt    Diabetes Paternal Aunt     Social History:  reports that he quit smoking about 55 years ago. His smoking use included cigarettes. He started smoking about 59 years ago. He has a 4 pack-year smoking history. He has never used smokeless tobacco. He reports current alcohol use. He reports that he does not use drugs.  ROS: All other review of systems were reviewed and are negative except what is noted above in HPI  Physical Exam: BP 131/63   Pulse 80   Constitutional:  Alert and oriented, No acute distress. HEENT: [place] AT, moist mucus membranes.  Trachea midline, no masses. Cardiovascular: No clubbing, cyanosis, or  edema. Respiratory: Normal respiratory effort, no increased work of breathing. GI: Abdomen is soft, nontender, nondistended, no abdominal masses GU: No CVA tenderness.  Lymph: No cervical or inguinal lymphadenopathy. Skin: No rashes, bruises or suspicious lesions. Neurologic: Grossly intact, no focal deficits, moving all 4 extremities. Psychiatric: Normal mood and affect.  Laboratory Data: Lab Results  Component Value Date   WBC 7.6 08/07/2023   HGB 14.3 08/07/2023   HCT 42.3 08/07/2023   MCV 92 08/07/2023   PLT 259 08/07/2023    Lab Results  Component Value Date  CREATININE 1.27 08/07/2023    No results found for: "PSA"  Lab Results  Component Value Date   TESTOSTERONE 738 09/11/2023    Lab Results  Component Value Date   HGBA1C 6.4 (H) 10/07/2022    Urinalysis    Component Value Date/Time   COLORURINE YELLOW 04/07/2016 1008   APPEARANCEUR Clear 04/10/2023 0839   LABSPEC 1.012 04/07/2016 1008   PHURINE 5.5 04/07/2016 1008   GLUCOSEU Negative 04/10/2023 0839   HGBUR NEGATIVE 04/07/2016 1008   BILIRUBINUR Negative 04/10/2023 0839   KETONESUR NEGATIVE 04/07/2016 1008   PROTEINUR Negative 04/10/2023 0839   PROTEINUR NEGATIVE 04/07/2016 1008   UROBILINOGEN 0.2 10/03/2022 1228   UROBILINOGEN 1.0 01/31/2008 0840   NITRITE Negative 04/10/2023 0839   NITRITE NEGATIVE 04/07/2016 1008   LEUKOCYTESUR Negative 04/10/2023 0839    Lab Results  Component Value Date   LABMICR Comment 04/10/2023   WBCUA None seen 03/03/2022   LABEPIT None seen 03/03/2022   BACTERIA None seen 03/03/2022    Pertinent Imaging:  Results for orders placed during the hospital encounter of 05/27/21  DG Abd 1 View  Narrative CLINICAL DATA:  LEFT lower quadrant abdominal pain, on and off constipation and diarrhea  EXAM: ABDOMEN - 1 VIEW  COMPARISON:  None  FINDINGS: Normal bowel gas pattern.  No bowel dilatation or bowel wall thickening.  Degenerative disc disease changes  thoracolumbar spine with minimal levoconvex scoliosis.  LEFT hip prosthesis.  No urinary tract calcification.  Lung bases clear.  IMPRESSION: Normal bowel gas pattern.   Electronically Signed By: Ulyses Southward M.D. On: 05/28/2021 09:26  No results found for this or any previous visit.  No results found for this or any previous visit.  No results found for this or any previous visit.  No results found for this or any previous visit.  No results found for this or any previous visit.  No results found for this or any previous visit.  No results found for this or any previous visit.   Assessment & Plan:    1. Elevated PSA (Primary) Followup 6 months with a PSA - Urinalysis, Routine w reflex microscopic  2. Nocturia Avodart 0.5mg  daily   No follow-ups on file.  Wilkie Aye, MD  [facility] [place]

## 2024-01-18 ENCOUNTER — Encounter: Payer: Self-pay | Admitting: Urology

## 2024-01-18 NOTE — Patient Instructions (Signed)

## 2024-01-25 DIAGNOSIS — M170 Bilateral primary osteoarthritis of knee: Secondary | ICD-10-CM | POA: Diagnosis not present

## 2024-01-27 ENCOUNTER — Encounter: Payer: Self-pay | Admitting: Family Medicine

## 2024-01-28 ENCOUNTER — Telehealth: Payer: Self-pay | Admitting: Pharmacist

## 2024-01-28 NOTE — Telephone Encounter (Signed)
   Patient would like to re-enroll in Novo Nordisk PAP.  We can process his Ozempic  1mg .  I'm unsure what his regimen is now that he is on an insulin  pump.  Can you reach out to patient to re-enroll?  We are happy to sign provider portion.  Thank you!

## 2024-02-01 ENCOUNTER — Other Ambulatory Visit: Payer: Self-pay | Admitting: Family Medicine

## 2024-02-01 DIAGNOSIS — I152 Hypertension secondary to endocrine disorders: Secondary | ICD-10-CM

## 2024-02-02 ENCOUNTER — Telehealth: Payer: Self-pay

## 2024-02-02 NOTE — Progress Notes (Deleted)
Pharmacy Medication Assistance Program Note    02/02/2024  Patient ID: Jeffrey Shaw, male   DOB: 04-02-1951, 73 y.o.   MRN: [id number]     02/02/2024  Outreach Medication One  Manufacturer Medication One [employer] [facility] Ozempic  Type of Radiographer, therapeutic Assistance  Date Application Sent to Prescriber 02/02/2024  Date Application Submitted to Manufacturer 02/02/2024  Method Application Sent to [employer]. PCP pgs emailed to [place] from [employer].

## 2024-02-09 ENCOUNTER — Ambulatory Visit (INDEPENDENT_AMBULATORY_CARE_PROVIDER_SITE_OTHER): Payer: Medicare Other | Admitting: Family Medicine

## 2024-02-09 ENCOUNTER — Encounter: Payer: Self-pay | Admitting: Family Medicine

## 2024-02-09 VITALS — BP 137/73 | HR 71 | Temp 96.8°F | Ht 76.0 in | Wt 238.8 lb

## 2024-02-09 DIAGNOSIS — E291 Testicular hypofunction: Secondary | ICD-10-CM | POA: Diagnosis not present

## 2024-02-09 DIAGNOSIS — R972 Elevated prostate specific antigen [PSA]: Secondary | ICD-10-CM | POA: Diagnosis not present

## 2024-02-09 DIAGNOSIS — E039 Hypothyroidism, unspecified: Secondary | ICD-10-CM | POA: Diagnosis not present

## 2024-02-09 DIAGNOSIS — E1169 Type 2 diabetes mellitus with other specified complication: Secondary | ICD-10-CM | POA: Diagnosis not present

## 2024-02-09 DIAGNOSIS — I152 Hypertension secondary to endocrine disorders: Secondary | ICD-10-CM

## 2024-02-09 DIAGNOSIS — Z0001 Encounter for general adult medical examination with abnormal findings: Secondary | ICD-10-CM

## 2024-02-09 DIAGNOSIS — E785 Hyperlipidemia, unspecified: Secondary | ICD-10-CM | POA: Diagnosis not present

## 2024-02-09 DIAGNOSIS — E1159 Type 2 diabetes mellitus with other circulatory complications: Secondary | ICD-10-CM

## 2024-02-09 DIAGNOSIS — E559 Vitamin D deficiency, unspecified: Secondary | ICD-10-CM

## 2024-02-09 DIAGNOSIS — E1165 Type 2 diabetes mellitus with hyperglycemia: Secondary | ICD-10-CM

## 2024-02-09 DIAGNOSIS — Z794 Long term (current) use of insulin: Secondary | ICD-10-CM | POA: Diagnosis not present

## 2024-02-09 DIAGNOSIS — Z0000 Encounter for general adult medical examination without abnormal findings: Secondary | ICD-10-CM | POA: Diagnosis not present

## 2024-02-09 DIAGNOSIS — E538 Deficiency of other specified B group vitamins: Secondary | ICD-10-CM

## 2024-02-09 LAB — BAYER DCA HB A1C WAIVED: HB A1C (BAYER DCA - WAIVED): 6.5 % — ABNORMAL HIGH (ref 4.8–5.6)

## 2024-02-09 NOTE — Progress Notes (Signed)
Complete physical exam  Patient: Jeffrey Shaw   DOB: 1951-04-22   73 y.o. Male  MRN: [id number]  Subjective:    Chief Complaint  Patient presents with   Annual Exam    Jeffrey Shaw is a 73 y.o. male who presents today for a complete physical exam. He reports consuming a general diet.  He exercises on a regular basis.  He generally feels well. He reports sleeping well. He does not have additional problems to discuss today.    History of Present Illness   Jeffrey Shaw is a 73 year old male who presents for an annual physical exam.  He feels generally well with no new health concerns. He eats and sleeps well, humorously noting he eats 'too good'. No nocturia is reported.  He has managed diabetes for 37 years, experiencing significant blood sugar fluctuations. His blood sugar rose from 95 to 211 without food intake this morning. He is cautious with insulin to avoid hypoglycemia and has been described as 'brittle' by his endocrinologist. He awaits follow-up on an insulin pump suggested six months ago.  He takes levothyroxine for his thyroid with no changes or issues reported.  He has a history of elevated PSA levels, having undergone two prostate biopsies and an MRI. Previously on finasteride, he switched to dutasteride (Avodart) due to low energy and testosterone levels, noting improved energy since the change. He awaits blood work to check testosterone levels.  He receives steroid injections in his knees every three months for osteoarthritis, finding them helpful. No impact on blood sugar from these injections is noted. No swelling in legs or feet is reported.  He takes magnesium and vitamin D3 supplements, having stopped a multivitamin. He needs to schedule an eye exam and is making payments to his dentist.  No recent sicknesses or surgeries, changes in hearing, bowel movement issues, or swelling in his legs or feet. No problems with his cholesterol medication and no muscle aches or  pains. No changes in hearing or recent illnesses.       Most recent fall risk assessment:    02/09/2024    8:12 AM  Fall Risk   Falls in the past year? 0  Risk for fall due to : No Fall Risks  Follow up Falls evaluation completed     Most recent depression screenings:    02/09/2024    8:13 AM 08/07/2023    8:09 AM  PHQ 2/9 Scores  PHQ - 2 Score 0 0  PHQ- 9 Score 0 0    Vision:Within last year and Dental: No current dental problems  Patient Active Problem List   Diagnosis Date Noted   High risk medication use 02/06/2023   Recurrent inguinal hernia of left side without obstruction or gangrene    Aortic atherosclerosis (HCC) 07/19/2021   Controlled type 2 diabetes mellitus with hyperglycemia, with long-term current use of insulin (HCC) 01/08/2021   Gastroesophageal reflux disease 01/08/2021   Onychomycosis 07/05/2020   Erectile dysfunction due to arterial insufficiency 07/07/2019   Coronary artery disease involving native coronary artery of native heart without angina pectoris    Hypothyroidism 08/02/2009   Hyperlipidemia associated with type 2 diabetes mellitus (HCC) 08/02/2009   Hypertension associated with type 2 diabetes mellitus (HCC) 08/02/2009   Past Medical History:  Diagnosis Date   Allergy morphine   Arthritis    CAD (coronary artery disease)    CABG 01/2008 /  Catheterization, November, 2012, LIMA to the LAD patent,  70%  distal left main stenosis involving the proximal circumflex, DES to the left main continuing into the circumflex   CORONARY ARTERY BYPASS GRAFT, HX OF 01/17/2010   Qualifier: Diagnosis of  By: Myrtis Ser, MD, [facility], Lemmie Evens    Diabetes mellitus    Dr. Nonie Hoyer, [facility]   GERD (gastroesophageal reflux disease)    HTN (hypertension)    Hx of CABG    2009, LIMA to LAD, right radial to PDA, SVG to diagonal, SVG to OM1   Hyperlipemia    Hypothyroid    Primary osteoarthritis of left hip 04/18/2016   Past Surgical History:  Procedure Laterality  Date   BACK SURGERY     CARPAL TUNNEL RELEASE     left hand   CATARACT EXTRACTION Bilateral    CORONARY ANGIOPLASTY     CORONARY ARTERY BYPASS GRAFT  01/2008   X 6   INGUINAL HERNIA REPAIR     right   INGUINAL HERNIA REPAIR Left 09/13/2021   Procedure: HERNIA REPAIR INGUINAL ADULT W/MESH;  Surgeon: [name], MD;  Location: [facility];  Service: General;  Laterality: Left;   JOINT REPLACEMENT  hip   LEFT HEART CATHETERIZATION WITH CORONARY/GRAFT ANGIOGRAM N/A 10/30/2011   Procedure: LEFT HEART CATHETERIZATION WITH Isabel Caprice;  Surgeon: Herby Abraham, MD;  Location: [facility] CATH LAB;  Service: Cardiovascular;  Laterality: N/A;   LUMBAR DISC SURGERY     removed   PERCUTANEOUS CORONARY STENT INTERVENTION (PCI-S) N/A 11/03/2011   Procedure: PERCUTANEOUS CORONARY STENT INTERVENTION (PCI-S);  Surgeon: Kathleene Hazel, MD;  Location: [facility] CATH LAB;  Service: Cardiovascular;  Laterality: N/A;   TONSILLECTOMY  ~ 1955   TOTAL HIP ARTHROPLASTY Left 04/18/2016   Procedure: TOTAL HIP ARTHROPLASTY;  Surgeon: Frederico Hamman, MD;  Location: [facility] OR;  Service: Orthopedics;  Laterality: Left;   UMBILICAL HERNIA REPAIR N/A 09/13/2021   Procedure: HERNIA REPAIR UMBILICAL ADULT W/MESH;  Surgeon: [name], MD;  Location: [facility];  Service: General;  Laterality: N/A;   URETEROLITHOTOMY Left    with stone basket removal   XI ROBOTIC ASSISTED INGUINAL HERNIA REPAIR WITH MESH Left 10/30/2022   Procedure: XI ROBOTIC ASSISTED INGUINAL HERNIA REPAIR WITH MESH;  Surgeon: [name], MD;  Location: [facility];  Service: General;  Laterality: Left;   Social History   Tobacco Use   Smoking status: Former    Current packs/day: 0.00    Average packs/day: 1 pack/day for 4.0 years (4.0 ttl pk-yrs)    Types: Cigarettes    Start date: 12/22/1964    Quit date: 12/22/1968    Years since quitting: 55.1   Smokeless tobacco: Never  Vaping Use   Vaping status: Never Used  Substance Use Topics   Alcohol  use: Yes    Comment: maybe one beer a month   Drug use: No   Social History   Socioeconomic History   Marital status: Married    Spouse name: SANDRA   Number of children: 0   Years of education: Not on file   Highest education level: 12th grade  Occupational History   Occupation: SELF EMPLOYED    Comment: retired   Occupation: Marine scientist: [employer]    Comment: 5 hours/ 5 days per week  Tobacco Use   Smoking status: Former    Current packs/day: 0.00    Average packs/day: 1 pack/day for 4.0 years (4.0 ttl pk-yrs)    Types: Cigarettes    Start date: 12/22/1964    Quit date: 12/22/1968  Years since quitting: 55.1   Smokeless tobacco: Never  Vaping Use   Vaping status: Never Used  Substance and Sexual Activity   Alcohol use: Yes    Comment: maybe one beer a month   Drug use: No   Sexual activity: Yes    Birth control/protection: None  Other Topics Concern   Not on file  Social History Narrative   Lives in one level home with his wife.   Very active - works at [employer], plays basketball, walks daily   Social Drivers of Health   Financial Resource Strain: Low Risk  (02/08/2024)   Overall Financial Resource Strain (CARDIA)    Difficulty of Paying Living Expenses: Not very hard  Food Insecurity: No Food Insecurity (02/08/2024)   Hunger Vital Sign    Worried About Running Out of Food in the Last Year: Never true    Ran Out of Food in the Last Year: Never true  Transportation Needs: No Transportation Needs (02/08/2024)   PRAPARE - Administrator, Civil Service (Medical): No    Lack of Transportation (Non-Medical): No  Physical Activity: Sufficiently Active (02/08/2024)   Exercise Vital Sign    Days of Exercise per Week: 5 days    Minutes of Exercise per Session: 60 min  Stress: No Stress Concern Present (02/08/2024)   [employer] of Occupational Health - Occupational Stress Questionnaire    Feeling of Stress : Not at all  Social Connections:  Moderately Integrated (02/08/2024)   Social Connection and Isolation Panel [NHANES]    Frequency of Communication with Friends and Family: Once a week    Frequency of Social Gatherings with Friends and Family: Once a week    Attends Religious Services: More than 4 times per year    Active Member of [employer] or Organizations: Yes    Attends Banker Meetings: More than 4 times per year    Marital Status: Married  Catering manager Violence: Not At Risk (06/09/2023)   Humiliation, Afraid, Rape, and Kick questionnaire    Fear of Current or Ex-Partner: No    Emotionally Abused: No    Physically Abused: No    Sexually Abused: No   Family Status  Relation Name Status   Mother  Deceased   Father  Deceased   Brother perry Kovacic Alive   Sister  Alive   MGM  Deceased   MGF robert lee martin Deceased   PGM  Deceased   PGF  Deceased   Pat Aunt christine nimer (Not Specified)   Oceanographer rivers Theatre manager (Not Specified)  No partnership data on file   Family History  Problem Relation Age of Onset   Dementia Mother    Liver disease Mother    Suicidality Father    Emphysema Father    Cancer Father    Drug abuse Brother        Clean now   Rheum arthritis Sister    Heart attack Maternal Grandmother    Emphysema Maternal Grandfather    Stroke Maternal Grandfather    Stomach cancer Paternal Grandmother    Diabetes Paternal Aunt    Diabetes Paternal Aunt    Allergies  Allergen Reactions   Morphine And Codeine Nausea Only      Patient Care Team: Sonny Masters, FNP as PCP - General (Family Medicine) Conley Rolls, Westley Chandler, MD as Referring Physician (Optometry) Pruitt, Lilla Shook, [facility] as Pharmacist (Family Medicine) Dorisann Frames, MD as Referring Physician (Endocrinology)  McKenzie, Mardene Celeste, MD as Consulting Physician (Urology)   Outpatient Medications Prior to Visit  Medication Sig   aspirin EC 81 MG tablet Take 81 mg by mouth daily. Swallow whole.   atorvastatin (LIPITOR) 40  MG tablet Take 1 tablet by mouth once daily   Continuous Blood Gluc Receiver (DEXCOM G6 RECEIVER) DEVI 1 Device by Does not apply route daily.   Continuous Blood Gluc Sensor (DEXCOM G6 SENSOR) MISC APPLY A NEW SENSOR EVERY 10 DAYS   Continuous Blood Gluc Transmit (DEXCOM G6 TRANSMITTER) MISC USE EVERY 3 MONTHS   dutasteride (AVODART) 0.5 MG capsule Take 1 capsule (0.5 mg total) by mouth daily.   famotidine (PEPCID) 20 MG tablet Take 20 mg by mouth 2 (two) times daily.   fluticasone (FLONASE) 50 MCG/ACT nasal spray Place 1 spray into both nostrils daily.   insulin aspart (NOVOLOG) 100 UNIT/ML FlexPen Inject 10-18 Units into the skin 3 (three) times daily before meals.   insulin degludec (TRESIBA FLEXTOUCH) 100 UNIT/ML FlexTouch Pen Inject 11 Units into the skin daily. Gets via [facility] patient assistance   Insulin Pen Needle 29G X MISC Use to inject insulin up to 6 times daily as directed. DX E11.65   levothyroxine (SYNTHROID) 175 MCG tablet TAKE 1 TABLET BY MOUTH ONCE DAILY BEFORE BREAKFAST   lisinopril (ZESTRIL) 10 MG tablet Take 1 tablet by mouth once daily   MAGNESIUM PO Take 1 tablet by mouth daily.   OZEMPIC, 1 MG/DOSE, 4 MG/3ML SOPN Inject 1 mg into the skin every Sunday.   sildenafil (VIAGRA) 100 MG tablet TAKE 1 TABLET BY MOUTH ONCE DAILY AS NEEDED FOR ERECTILE DYSFUNCTION   [DISCONTINUED] finasteride (PROSCAR) 5 MG tablet Take 1 tablet by mouth once daily   [DISCONTINUED] metFORMIN (GLUCOPHAGE-XR) 500 MG 24 hr tablet Take 1 tablet by mouth once daily with breakfast   No facility-administered medications prior to visit.    Review of Systems  Musculoskeletal:  Positive for joint pain (bilateral knees at times).  All other systems reviewed and are negative.         Objective:     BP 137/73   Pulse 71   Temp (!) 96.8 F (36 C)   Ht 6\' 4"  (1.93 m)   Wt 238 lb 12.8 oz (108.3 kg)   SpO2 95%   BMI 29.07 kg/m  BP Readings from Last 3 Encounters:  02/09/24 137/73   01/08/24 131/63  08/07/23 133/73   Wt Readings from Last 3 Encounters:  02/09/24 238 lb 12.8 oz (108.3 kg)  08/07/23 231 lb 6.4 oz (105 kg)  06/09/23 231 lb (104.8 kg)   SpO2 Readings from Last 3 Encounters:  02/09/24 95%  08/07/23 95%  02/06/23 98%      Physical Exam Vitals and nursing note reviewed.  Constitutional:      General: He is not in acute distress.    Appearance: Normal appearance. He is well-developed, well-groomed and overweight. He is not ill-appearing, toxic-appearing or diaphoretic.  HENT:     Head: Normocephalic and atraumatic.     Jaw: There is normal jaw occlusion.     Right Ear: Hearing, tympanic membrane, ear canal and external ear normal.     Left Ear: Hearing, tympanic membrane, ear canal and external ear normal.     Nose: Nose normal.     Mouth/Throat:     Lips: Pink.     Mouth: Mucous membranes are moist.     Pharynx: Oropharynx is clear. Uvula midline.  Eyes:     General: Lids are normal.     Extraocular Movements: Extraocular movements intact.     Conjunctiva/sclera: Conjunctivae normal.     Pupils: Pupils are equal, round, and reactive to light.  Neck:     Thyroid: No thyroid mass, thyromegaly or thyroid tenderness.     Vascular: No carotid bruit or JVD.     Trachea: Trachea and phonation normal.  Cardiovascular:     Rate and Rhythm: Normal rate and regular rhythm.     Chest Wall: PMI is not displaced.     Pulses: Normal pulses.     Heart sounds: Normal heart sounds. No murmur heard.    No friction rub. No gallop.  Pulmonary:     Effort: Pulmonary effort is normal. No respiratory distress.     Breath sounds: Normal breath sounds. No wheezing.  Abdominal:     General: Bowel sounds are normal. There is no distension or abdominal bruit.     Palpations: Abdomen is soft. There is no hepatomegaly or splenomegaly.     Tenderness: There is no abdominal tenderness. There is no right CVA tenderness or left CVA tenderness.     Hernia: No hernia  is present.  Musculoskeletal:        General: Normal range of motion.     Cervical back: Normal range of motion and neck supple.     Right lower leg: No edema.     Left lower leg: No edema.  Lymphadenopathy:     Cervical: No cervical adenopathy.  Skin:    General: Skin is warm and dry.     Capillary Refill: Capillary refill takes less than 2 seconds.     Coloration: Skin is not cyanotic, jaundiced or pale.     Findings: No rash.  Neurological:     General: No focal deficit present.     Mental Status: He is alert and oriented to person, place, and time.     Sensory: Sensation is intact.     Motor: Motor function is intact.     Coordination: Coordination is intact.     Gait: Gait is intact.     Deep Tendon Reflexes: Reflexes are normal and symmetric.  Psychiatric:        Attention and Perception: Attention and perception normal.        Mood and Affect: Mood and affect normal.        Speech: Speech normal.        Behavior: Behavior normal. Behavior is cooperative.        Thought Content: Thought content normal.        Cognition and Memory: Cognition and memory normal.        Judgment: Judgment normal.       Last CBC Lab Results  Component Value Date   WBC 7.6 08/07/2023   HGB 14.3 08/07/2023   HCT 42.3 08/07/2023   MCV 92 08/07/2023   MCH 31.2 08/07/2023   RDW 12.2 08/07/2023   PLT 259 08/07/2023   Last metabolic panel Lab Results  Component Value Date   GLUCOSE 134 (H) 08/07/2023   NA 135 08/07/2023   K 5.1 08/07/2023   CL 102 08/07/2023   CO2 23 08/07/2023   BUN 20 08/07/2023   CREATININE 1.27 08/07/2023   EGFR 60 08/07/2023   CALCIUM 9.6 08/07/2023   PROT 6.7 08/07/2023   ALBUMIN 4.3 08/07/2023   LABGLOB 2.4 08/07/2023   AGRATIO 1.8 02/06/2023   BILITOT 1.9 (H) 08/07/2023  ALKPHOS 78 08/07/2023   AST 43 (H) 08/07/2023   ALT 37 08/07/2023   ANIONGAP 6 10/07/2022   Last lipids Lab Results  Component Value Date   CHOL 139 08/07/2023   HDL 66  08/07/2023   LDLCALC 61 08/07/2023   TRIG 54 08/07/2023   CHOLHDL 2.1 08/07/2023   Last hemoglobin A1c Lab Results  Component Value Date   HGBA1C 6.4 (H) 10/07/2022   Last thyroid functions Lab Results  Component Value Date   TSH 5.640 (H) 08/07/2023   T4TOTAL 9.0 08/07/2023   Last vitamin D Lab Results  Component Value Date   VD25OH 40.0 02/06/2023   Last vitamin B12 and Folate Lab Results  Component Value Date   VITAMINB12 >2000 (H) 02/06/2023        Assessment & Plan:    Routine Health Maintenance and Physical Exam  Immunization History  Administered Date(s) Administered   Influenza Split 10/07/2017   Influenza,inj,quad, With Preservative 09/24/2018, 10/10/2019   Influenza-Unspecified 10/13/2008, 10/06/2014, 10/05/2015, 09/24/2016, 09/24/2018, 10/05/2020   Pneumococcal Conjugate-13 10/06/2014   Pneumococcal Polysaccharide-23 10/09/1997, 01/07/2019   Td 09/02/2004   Tdap 03/14/2013   Zoster Recombinant(Shingrix) 09/28/2020, 03/07/2021   Zoster, Live 07/29/2013    Health Maintenance  Topic Date Due   HEMOGLOBIN A1C  04/08/2023   Diabetic kidney evaluation - Urine ACR  02/07/2024   INFLUENZA VACCINE  03/21/2024 (Originally 07/23/2023)   DTaP/Tdap/Td (3 - Td or Tdap) 02/08/2025 (Originally 03/15/2023)   OPHTHALMOLOGY EXAM  02/18/2024   Medicare Annual Wellness (AWV)  06/08/2024   Diabetic kidney evaluation - eGFR measurement  08/06/2024   FOOT EXAM  08/06/2024   Fecal DNA (Cologuard)  02/15/2026   Pneumonia Vaccine 42+ Years old  Completed   Hepatitis C Screening  Completed   Zoster Vaccines- Shingrix  Completed   HPV VACCINES  Aged Out   COVID-19 Vaccine  Discontinued    Discussed health benefits of physical activity, and encouraged him to engage in regular exercise appropriate for his age and condition.  Problem List Items Addressed This Visit       Cardiovascular and Mediastinum   Hypertension associated with type 2 diabetes mellitus (HCC)    Relevant Orders   Lipid panel   CBC with Differential/Platelet   CMP14+EGFR   Thyroid Panel With TSH     Endocrine   Hypothyroidism   Relevant Orders   Thyroid Panel With TSH   Hyperlipidemia associated with type 2 diabetes mellitus (HCC)   Relevant Orders   Lipid panel   CMP14+EGFR   Controlled type 2 diabetes mellitus with hyperglycemia, with long-term current use of insulin (HCC)   Relevant Orders   Lipid panel   CBC with Differential/Platelet   CMP14+EGFR   Thyroid Panel With TSH   Microalbumin / creatinine urine ratio   Bayer DCA Hb A1c Waived   Other Visit Diagnoses       Annual physical exam    -  Primary   Relevant Orders   Lipid panel   CBC with Differential/Platelet   CMP14+EGFR   Thyroid Panel With TSH   Vitamin B12   VITAMIN D 25 Hydroxy (Vit-D Deficiency, Fractures)   Testosterone,Free and Total   Microalbumin / creatinine urine ratio   Bayer DCA Hb A1c Waived   PSA, total and free     Testosterone deficiency in male       Relevant Orders   Testosterone,Free and Total     Elevated PSA       Relevant  Orders   PSA, total and free     Vitamin D deficiency       Relevant Orders   VITAMIN D 25 Hydroxy (Vit-D Deficiency, Fractures)     Vitamin B 12 deficiency       Relevant Orders   Vitamin B12     Assessment and Plan    Type 2 Diabetes Mellitus Long-standing Type 2 Diabetes Mellitus with brittle blood sugar control. Reports significant fluctuations in blood glucose levels despite adherence to medication and dietary recommendations. Blood sugar levels can spike from 95 to 211 within hours. Currently under the care of an endocrinologist and considering an insulin pump for better management. Discussed risks of hypoglycemia with insulin adjustments and potential benefits of an insulin pump for more stable control. - Discuss insulin pump with endocrinologist - Monitor blood glucose levels regularly - Continue current diabetes management plan  Benign  Prostatic Hyperplasia (BPH) Elevated PSA levels with two prostate biopsies and an MRI showing benign prostatic hyperplasia. Currently on Avodart (dutasteride) after switching from finasteride due to low energy levels and low free testosterone. Reports improved energy levels with Avodart. Discussed anticipated outcome of increased testosterone levels and need for regular monitoring. - Order PSA test - Order testosterone levels - Continue Avodart (dutasteride)  Hypertension Hypertension well-controlled with lisinopril, also used for renal protection due to diabetes. Discussed benefits of ACE inhibitors for kidney protection in diabetic patients. - Continue lisinopril  Hyperlipidemia Hyperlipidemia managed with cholesterol medication. No reported side effects such as myalgia. - Continue current cholesterol medication  Hypothyroidism Hypothyroidism managed with levothyroxine. No recent changes in medication or symptoms. - Continue levothyroxine  Osteoarthritis of the Knees Osteoarthritis of the knees managed with steroid injections every three months. Discussed potential alternative treatments including regenerative medicine options like PRP. Explained benefits of PRP in promoting cartilage and bone regeneration. - Continue steroid injections - Discuss regenerative medicine options with orthopedic specialist  General Health Maintenance Routine health maintenance discussed including dental visits, eye exams, and supplements. Due for an eye exam. Discussed importance of regular eye exams for diabetic patients. - Schedule eye exam - Continue magnesium and vitamin D3 supplements - No need for multivitamin unless dietary deficiency is identified  Follow-up - Schedule follow-up appointment in six months.       Return in about 6 months (around 08/08/2024) for chronic follow up.     Kari Baars, FNP

## 2024-02-10 LAB — MICROALBUMIN / CREATININE URINE RATIO
Creatinine, Urine: 71 mg/dL
Microalb/Creat Ratio: 6 mg/g{creat} (ref 0–29)
Microalbumin, Urine: 4.5 ug/mL

## 2024-02-13 ENCOUNTER — Encounter: Payer: Self-pay | Admitting: Urology

## 2024-02-13 LAB — CBC WITH DIFFERENTIAL/PLATELET
Basophils Absolute: 0.1 10*3/uL (ref 0.0–0.2)
Basos: 1 %
EOS (ABSOLUTE): 0.1 10*3/uL (ref 0.0–0.4)
Eos: 2 %
Hematocrit: 46.5 % (ref 37.5–51.0)
Hemoglobin: 15.7 g/dL (ref 13.0–17.7)
Immature Grans (Abs): 0.1 10*3/uL (ref 0.0–0.1)
Immature Granulocytes: 1 %
Lymphocytes Absolute: 1.9 10*3/uL (ref 0.7–3.1)
Lymphs: 21 %
MCH: 32.5 pg (ref 26.6–33.0)
MCHC: 33.8 g/dL (ref 31.5–35.7)
MCV: 96 fL (ref 79–97)
Monocytes Absolute: 0.6 10*3/uL (ref 0.1–0.9)
Monocytes: 7 %
Neutrophils Absolute: 6.5 10*3/uL (ref 1.4–7.0)
Neutrophils: 68 %
Platelets: 285 10*3/uL (ref 150–450)
RBC: 4.83 x10E6/uL (ref 4.14–5.80)
RDW: 12.2 % (ref 11.6–15.4)
WBC: 9.2 10*3/uL (ref 3.4–10.8)

## 2024-02-13 LAB — CMP14+EGFR
ALT: 30 [IU]/L (ref 0–44)
AST: 26 [IU]/L (ref 0–40)
Albumin: 4.5 g/dL (ref 3.8–4.8)
Alkaline Phosphatase: 86 [IU]/L (ref 44–121)
BUN/Creatinine Ratio: 14 (ref 10–24)
BUN: 22 mg/dL (ref 8–27)
Bilirubin Total: 2.4 mg/dL — ABNORMAL HIGH (ref 0.0–1.2)
CO2: 22 mmol/L (ref 20–29)
Calcium: 10.4 mg/dL — ABNORMAL HIGH (ref 8.6–10.2)
Chloride: 99 mmol/L (ref 96–106)
Creatinine, Ser: 1.59 mg/dL — ABNORMAL HIGH (ref 0.76–1.27)
Globulin, Total: 2.6 g/dL (ref 1.5–4.5)
Glucose: 189 mg/dL — ABNORMAL HIGH (ref 70–99)
Potassium: 5.3 mmol/L — ABNORMAL HIGH (ref 3.5–5.2)
Sodium: 135 mmol/L (ref 134–144)
Total Protein: 7.1 g/dL (ref 6.0–8.5)
eGFR: 46 mL/min/{1.73_m2} — ABNORMAL LOW (ref >59)

## 2024-02-13 LAB — VITAMIN D 25 HYDROXY (VIT D DEFICIENCY, FRACTURES): Vit D, 25-Hydroxy: 48.9 ng/mL (ref 30.0–100.0)

## 2024-02-13 LAB — THYROID PANEL WITH TSH
Free Thyroxine Index: 2.8 (ref 1.2–4.9)
T3 Uptake Ratio: 29 % (ref 24–39)
T4, Total: 9.7 ug/dL (ref 4.5–12.0)
TSH: 8.51 u[IU]/mL — ABNORMAL HIGH (ref 0.450–4.500)

## 2024-02-13 LAB — PSA, TOTAL AND FREE
PSA, Free Pct: 29 %
PSA, Free: 1.22 ng/mL
Prostate Specific Ag, Serum: 4.2 ng/mL — ABNORMAL HIGH (ref 0.0–4.0)

## 2024-02-13 LAB — VITAMIN B12: Vitamin B-12: 821 pg/mL (ref 232–1245)

## 2024-02-13 LAB — LIPID PANEL
Chol/HDL Ratio: 2.2 {ratio} (ref 0.0–5.0)
Cholesterol, Total: 158 mg/dL (ref 100–199)
HDL: 71 mg/dL (ref >39)
LDL Chol Calc (NIH): 74 mg/dL (ref 0–99)
Triglycerides: 63 mg/dL (ref 0–149)
VLDL Cholesterol Cal: 13 mg/dL (ref 5–40)

## 2024-02-13 LAB — TESTOSTERONE,FREE AND TOTAL
Testosterone, Free: 5.2 pg/mL — ABNORMAL LOW (ref 6.6–18.1)
Testosterone: 741 ng/dL (ref 264–916)

## 2024-02-15 ENCOUNTER — Other Ambulatory Visit: Payer: Self-pay

## 2024-02-15 ENCOUNTER — Other Ambulatory Visit: Payer: Medicare Other

## 2024-02-15 DIAGNOSIS — N289 Disorder of kidney and ureter, unspecified: Secondary | ICD-10-CM

## 2024-02-15 DIAGNOSIS — R7989 Other specified abnormal findings of blood chemistry: Secondary | ICD-10-CM

## 2024-02-15 NOTE — Telephone Encounter (Signed)
**Note De-identified Hilliary Jock Obfuscation** Please advise 

## 2024-02-16 ENCOUNTER — Telehealth: Payer: Self-pay | Admitting: Family Medicine

## 2024-02-16 LAB — CMP14+EGFR
ALT: 29 [IU]/L (ref 0–44)
AST: 28 [IU]/L (ref 0–40)
Albumin: 4.1 g/dL (ref 3.8–4.8)
Alkaline Phosphatase: 72 [IU]/L (ref 44–121)
BUN/Creatinine Ratio: 13 (ref 10–24)
BUN: 17 mg/dL (ref 8–27)
Bilirubin Total: 1.7 mg/dL — ABNORMAL HIGH (ref 0.0–1.2)
CO2: 20 mmol/L (ref 20–29)
Calcium: 9.4 mg/dL (ref 8.6–10.2)
Chloride: 100 mmol/L (ref 96–106)
Creatinine, Ser: 1.32 mg/dL — ABNORMAL HIGH (ref 0.76–1.27)
Globulin, Total: 2.3 g/dL (ref 1.5–4.5)
Glucose: 146 mg/dL — ABNORMAL HIGH (ref 70–99)
Potassium: 5 mmol/L (ref 3.5–5.2)
Sodium: 136 mmol/L (ref 134–144)
Total Protein: 6.4 g/dL (ref 6.0–8.5)
eGFR: 57 mL/min/{1.73_m2} — ABNORMAL LOW (ref >59)

## 2024-02-16 NOTE — Addendum Note (Signed)
 Addended by: Angela Adam on: 02/16/2024 02:39 PM   Modules accepted: Orders

## 2024-02-16 NOTE — Telephone Encounter (Unsigned)
 Copied from CRM [id number]. Topic: Clinical - Lab/Test Results >> Feb 16, 2024  1:25 PM Fuller Mandril wrote: Reason for CRM: Patient returned call for results. Read note as written by provider. Patient understood. Thank You

## 2024-02-19 NOTE — Telephone Encounter (Signed)
 Application rec'd, corrected pcp address, and refaxed to [facility].

## 2024-02-19 NOTE — Telephone Encounter (Signed)
 Received patient assistance paperwork for patient via fax machine that looks completed/signed electronically. Will fax to [facility].

## 2024-02-19 NOTE — Progress Notes (Deleted)
 Pharmacy Medication Assistance Program Note    02/19/2024  Patient ID: Jeffrey Shaw, male   DOB: 08/30/1951, 73 y.o.   MRN: [id number]     02/02/2024  Outreach Medication One  Manufacturer Medication One [employer] [facility] Ozempic  Type of Radiographer, therapeutic Assistance  Date Application Sent to Prescriber 02/02/2024  Date Application Submitted to Manufacturer 02/02/2024  Method Application Sent to Manufacturer Online   Provider portion submitted online 02/19/24 8:30am  Refaxed provider pages & insurance card 02/19/24 10:00am with corrected provider address

## 2024-02-24 NOTE — Progress Notes (Signed)
 Pharmacy Medication Assistance Program Note    02/24/2024  Patient ID: Jeffrey Shaw, male   DOB: January 12, 1951, 73 y.o.   MRN: [id number]     02/02/2024  Outreach Medication One  Manufacturer Medication One [employer] [facility] Ozempic  Type of Radiographer, therapeutic Assistance  Date Application Sent to Prescriber 02/02/2024  Date Application Submitted to Manufacturer 02/02/2024  Method Application Sent to Manufacturer Online  Patient Assistance Determination Approved  Approval Start Date 02/22/2024  Approval End Date 12/21/2024        02/24/2024  Patient ID: Jeffrey Shaw, male  DOB: 07-23-1951, 73 y.o.  MRN:  [id number]     02/24/2024  Outreach Medication Two  Manufacturer Medication Two [facility] [facility]  [facility] Evaristo Bury  Dose of Christianne Borrow  Type of Radiographer, therapeutic Assistance  Patient Assistance Determination Approved  Approval Start Date 02/22/2024      02/24/2024  Patient ID: Jeffrey Shaw, male  DOB: 11/30/1951, 73 y.o.  MRN:  [id number]     02/24/2024  Outreach Medication Three  Manufacturer Medication Three [employer]  [facility] Novolog  Dose of Novolog flexpen  Type of Sport and exercise psychologist  Patient Assistance Determination Approved  Approval Start Date 02/22/2024  Approval End Date 12/21/2024     Medications should arrive in 10-14 business days

## 2024-03-07 DIAGNOSIS — E7800 Pure hypercholesterolemia, unspecified: Secondary | ICD-10-CM | POA: Diagnosis not present

## 2024-03-07 DIAGNOSIS — E039 Hypothyroidism, unspecified: Secondary | ICD-10-CM | POA: Diagnosis not present

## 2024-03-07 DIAGNOSIS — E139 Other specified diabetes mellitus without complications: Secondary | ICD-10-CM | POA: Diagnosis not present

## 2024-03-07 DIAGNOSIS — I10 Essential (primary) hypertension: Secondary | ICD-10-CM | POA: Diagnosis not present

## 2024-03-08 ENCOUNTER — Telehealth: Payer: Self-pay | Admitting: Family Medicine

## 2024-03-08 MED ORDER — OZEMPIC (1 MG/DOSE) 4 MG/3ML ~~LOC~~ SOPN: 1.0000 mg | PEN_INJECTOR | SUBCUTANEOUS | 0 refills | Status: AC

## 2024-03-08 NOTE — Telephone Encounter (Signed)
 Pt only needs a 1 mos supply sent to [facility], this has been sent.

## 2024-03-08 NOTE — Telephone Encounter (Signed)
  Prescription Request  03/08/2024  Is this a "Controlled Substance" medicine? no  Have you seen your PCP in the last 2 weeks? 02/18  If YES, route message to pool  -  If NO, patient needs to be scheduled for appointment.  What is the name of the medication or equipment? Pt is going to call company to get a one month voucher for ozempic  Have you contacted your pharmacy to request a refill? no   Which pharmacy would you like this sent to? [facility] in [place]   Patient notified that their request is being sent to the clinical staff for review and that they should receive a response within 2 business days.

## 2024-03-16 ENCOUNTER — Other Ambulatory Visit: Payer: Self-pay | Admitting: Urology

## 2024-03-25 ENCOUNTER — Other Ambulatory Visit (HOSPITAL_COMMUNITY): Payer: Self-pay

## 2024-03-30 ENCOUNTER — Other Ambulatory Visit: Payer: Self-pay | Admitting: Family Medicine

## 2024-03-30 DIAGNOSIS — E1165 Type 2 diabetes mellitus with hyperglycemia: Secondary | ICD-10-CM

## 2024-03-30 NOTE — Telephone Encounter (Unsigned)
 Copied from CRM [id number]. Topic: Clinical - Medication Refill >> Mar 30, 2024  2:30 PM Everette C wrote: Most Recent Primary Care Visit:   Medication: Continuous Blood Gluc Receiver Vira Agar G6 RECEIVER) DEVI [[id number]]   Has the patient contacted their pharmacy? No (Agent: If no, request that the patient contact the pharmacy for the refill. If patient does not wish to contact the pharmacy document the reason why and proceed with request.) (Agent: If yes, when and what did the pharmacy advise?)  Is this the correct pharmacy for this prescription? Yes If no, delete pharmacy and type the correct one.  This is the patient's preferred pharmacy:  [facility] [address], [place] - [address] [place] [address] Phone: [phone] Fax: [phone]   Has the prescription been filled recently? No  Is the patient out of the medication? Yes  Has the patient been seen for an appointment in the last year OR does the patient have an upcoming appointment? Yes  Can we respond through MyChart? No  Agent: Please be advised that Rx refills may take up to 3 business days. We ask that you follow-up with your pharmacy.

## 2024-04-04 ENCOUNTER — Other Ambulatory Visit: Payer: Self-pay | Admitting: Family Medicine

## 2024-04-04 ENCOUNTER — Encounter: Payer: Self-pay | Admitting: Family Medicine

## 2024-04-04 DIAGNOSIS — E039 Hypothyroidism, unspecified: Secondary | ICD-10-CM | POA: Diagnosis not present

## 2024-04-04 DIAGNOSIS — E1165 Type 2 diabetes mellitus with hyperglycemia: Secondary | ICD-10-CM

## 2024-04-04 MED ORDER — DEXCOM G6 RECEIVER DEVI: 1.0000 | Freq: Every day | 0 refills | Status: DC

## 2024-04-04 NOTE — Telephone Encounter (Signed)
 Pt questioning when rx for dexcom G6 receiver will be sent. RN called pharm, pt was given a G6 Transmitter on 4/11.

## 2024-04-12 ENCOUNTER — Telehealth: Payer: Self-pay | Admitting: Family Medicine

## 2024-04-12 ENCOUNTER — Other Ambulatory Visit

## 2024-04-12 DIAGNOSIS — N289 Disorder of kidney and ureter, unspecified: Secondary | ICD-10-CM

## 2024-04-12 NOTE — Telephone Encounter (Signed)
 Pt scheduled to come in today for labs. Please add future lab order. Says its for PCP to recheck his last labs from February.   Copied from CRM [id number]. Topic: Clinical - Request for Lab/Test Order >> Apr 12, 2024  7:59 AM Jeffrey Shaw wrote: Reason for CRM: Patient requesting to have lab work completed. States he can come in today 4/22 at 1145. Please contact patient for scheduling.

## 2024-04-12 NOTE — Telephone Encounter (Signed)
 Future order placed

## 2024-04-13 ENCOUNTER — Encounter: Payer: Self-pay | Admitting: Family Medicine

## 2024-04-13 LAB — BMP8+EGFR
BUN/Creatinine Ratio: 12 (ref 10–24)
BUN: 15 mg/dL (ref 8–27)
CO2: 24 mmol/L (ref 20–29)
Calcium: 9.9 mg/dL (ref 8.6–10.2)
Chloride: 104 mmol/L (ref 96–106)
Creatinine, Ser: 1.25 mg/dL (ref 0.76–1.27)
Glucose: 83 mg/dL (ref 70–99)
Potassium: 4.9 mmol/L (ref 3.5–5.2)
Sodium: 140 mmol/L (ref 134–144)
eGFR: 60 mL/min/{1.73_m2} (ref >59)

## 2024-04-22 DIAGNOSIS — M170 Bilateral primary osteoarthritis of knee: Secondary | ICD-10-CM | POA: Diagnosis not present

## 2024-04-28 ENCOUNTER — Other Ambulatory Visit: Payer: Self-pay | Admitting: Family Medicine

## 2024-04-28 DIAGNOSIS — E1165 Type 2 diabetes mellitus with hyperglycemia: Secondary | ICD-10-CM

## 2024-04-30 ENCOUNTER — Other Ambulatory Visit: Payer: Self-pay | Admitting: Family Medicine

## 2024-04-30 DIAGNOSIS — E1169 Type 2 diabetes mellitus with other specified complication: Secondary | ICD-10-CM

## 2024-04-30 DIAGNOSIS — E1165 Type 2 diabetes mellitus with hyperglycemia: Secondary | ICD-10-CM

## 2024-04-30 DIAGNOSIS — I2510 Atherosclerotic heart disease of native coronary artery without angina pectoris: Secondary | ICD-10-CM

## 2024-04-30 DIAGNOSIS — I700 Atherosclerosis of aorta: Secondary | ICD-10-CM

## 2024-05-01 ENCOUNTER — Other Ambulatory Visit: Payer: Self-pay | Admitting: Family Medicine

## 2024-05-01 DIAGNOSIS — I152 Hypertension secondary to endocrine disorders: Secondary | ICD-10-CM

## 2024-05-14 ENCOUNTER — Other Ambulatory Visit: Payer: Self-pay | Admitting: Family Medicine

## 2024-05-14 DIAGNOSIS — E1165 Type 2 diabetes mellitus with hyperglycemia: Secondary | ICD-10-CM

## 2024-05-20 ENCOUNTER — Encounter: Payer: Self-pay | Admitting: Family Medicine

## 2024-05-20 ENCOUNTER — Ambulatory Visit: Admitting: Family Medicine

## 2024-05-20 ENCOUNTER — Ambulatory Visit: Payer: Self-pay | Admitting: Family Medicine

## 2024-05-20 VITALS — BP 117/66 | HR 6 | Temp 97.5°F | Ht 76.0 in | Wt 231.4 lb

## 2024-05-20 DIAGNOSIS — I2510 Atherosclerotic heart disease of native coronary artery without angina pectoris: Secondary | ICD-10-CM

## 2024-05-20 DIAGNOSIS — E039 Hypothyroidism, unspecified: Secondary | ICD-10-CM

## 2024-05-20 DIAGNOSIS — Z7985 Long-term (current) use of injectable non-insulin antidiabetic drugs: Secondary | ICD-10-CM

## 2024-05-20 DIAGNOSIS — R5383 Other fatigue: Secondary | ICD-10-CM

## 2024-05-20 DIAGNOSIS — I152 Hypertension secondary to endocrine disorders: Secondary | ICD-10-CM | POA: Diagnosis not present

## 2024-05-20 DIAGNOSIS — E1159 Type 2 diabetes mellitus with other circulatory complications: Secondary | ICD-10-CM

## 2024-05-20 DIAGNOSIS — R5381 Other malaise: Secondary | ICD-10-CM | POA: Diagnosis not present

## 2024-05-20 MED ORDER — LISINOPRIL 5 MG PO TABS: 5.0000 mg | ORAL_TABLET | Freq: Every day | ORAL | 3 refills | Status: AC

## 2024-05-20 NOTE — Progress Notes (Signed)
 Subjective:  Patient ID: Jeffrey Shaw, male    DOB: 04-16-1951, 73 y.o.   MRN: [id number]  Patient Care Team: Galvin Jules, FNP as PCP - General (Family Medicine) Hyland Mailman, MD as Referring Physician (Optometry) Delilah Fend, [facility] as Pharmacist (Family Medicine) Tasia Farr, MD as Referring Physician (Endocrinology) Claretta Croft Arden Beck, MD as Consulting Physician (Urology)   Chief Complaint:  Fatigue (X 6-7 weeks.  States he has not energy and no get up and go. )   HPI: Jeffrey Shaw is a 73 y.o. male presenting on 05/20/2024 for Fatigue (X 6-7 weeks.  States he has not energy and no get up and go. )   Jeffrey Shaw is a 73 year old male with diabetes and cardiovascular disease who presents with fatigue and lack of energy.  He has been experiencing significant fatigue and lack of energy for the past five to six weeks. He describes having 'no get up and go' and feeling 'dog tired' after minimal activity. Despite maintaining his usual routine of waking up early, working out, and walking, he feels exhausted by the time he returns home. He notes that he has to force himself to perform activities such as yard work.  There have been no new medications or changes in his routine, but his Ozempic  dosage was increased to 2 mg weekly about three weeks ago. He continues to use Novolog , dosing based on carbohydrate intake and blood sugar levels, typically administering 10 to 20 units with meals. There was also a recent increase in his thyroid  medication dosage in February.  He expresses concern about his blood pressure medication, as his blood pressure was recorded at 117/60. He recalls being prescribed an ACE inhibitor for kidney function related to his diabetes. His blood sugars are described as 'brittle,' with significant fluctuations, and he is cautious with insulin  dosing to avoid hypoglycemia.  No chest pain, leg swelling, palpitations, abnormal bleeding, bruising, fevers,  chills, or diarrhea. He reports a decreased appetite but no significant changes in diet. He also mentions a history of low free testosterone  levels, though his total testosterone  levels have been normal.          Relevant past medical, surgical, family, and social history reviewed and updated as indicated.  Allergies and medications reviewed and updated. Data reviewed: Chart in Epic.   Past Medical History:  Diagnosis Date   Allergy morphine    Arthritis    CAD (coronary artery disease)    CABG 01/2008 /  Catheterization, November, 2012, LIMA to the LAD patent,  70% distal left main stenosis involving the proximal circumflex, DES to the left main continuing into the circumflex   CORONARY ARTERY BYPASS GRAFT, HX OF 01/17/2010   Qualifier: Diagnosis of  By: Ardell Koller, MD, [facility], Stefanie Edinger    Diabetes mellitus    Dr. Davina Ester, [facility]   GERD (gastroesophageal reflux disease)    HTN (hypertension)    Hx of CABG    2009, LIMA to LAD, right radial to PDA, SVG to diagonal, SVG to OM1   Hyperlipemia    Hypothyroid    Primary osteoarthritis of left hip 04/18/2016    Past Surgical History:  Procedure Laterality Date   BACK SURGERY     CARPAL TUNNEL RELEASE     left hand   CATARACT EXTRACTION Bilateral    CORONARY ANGIOPLASTY     CORONARY ARTERY BYPASS GRAFT  01/2008   X 6   INGUINAL HERNIA REPAIR  right   INGUINAL HERNIA REPAIR Left 09/13/2021   Procedure: HERNIA REPAIR INGUINAL ADULT W/MESH;  Surgeon: Alanda Allegra, MD;  Location: [facility];  Service: General;  Laterality: Left;   JOINT REPLACEMENT  hip   LEFT HEART CATHETERIZATION WITH CORONARY/GRAFT ANGIOGRAM N/A 10/30/2011   Procedure: LEFT HEART CATHETERIZATION WITH Estella Helling;  Surgeon: Kristopher Pheasant, MD;  Location: [facility] CATH LAB;  Service: Cardiovascular;  Laterality: N/A;   LUMBAR DISC SURGERY     removed   PERCUTANEOUS CORONARY STENT INTERVENTION (PCI-S) N/A 11/03/2011   Procedure: PERCUTANEOUS CORONARY STENT  INTERVENTION (PCI-S);  Surgeon: Odie Benne, MD;  Location: [facility] CATH LAB;  Service: Cardiovascular;  Laterality: N/A;   TONSILLECTOMY  ~ 1955   TOTAL HIP ARTHROPLASTY Left 04/18/2016   Procedure: TOTAL HIP ARTHROPLASTY;  Surgeon: Marlena Sima, MD;  Location: [facility] OR;  Service: Orthopedics;  Laterality: Left;   UMBILICAL HERNIA REPAIR N/A 09/13/2021   Procedure: HERNIA REPAIR UMBILICAL ADULT W/MESH;  Surgeon: Alanda Allegra, MD;  Location: [facility];  Service: General;  Laterality: N/A;   URETEROLITHOTOMY Left    with stone basket removal   XI ROBOTIC ASSISTED INGUINAL HERNIA REPAIR WITH MESH Left 10/30/2022   Procedure: XI ROBOTIC ASSISTED INGUINAL HERNIA REPAIR WITH MESH;  Surgeon: Alanda Allegra, MD;  Location: [facility];  Service: General;  Laterality: Left;    Social History   Socioeconomic History   Marital status: Married    Spouse name: SANDRA   Number of children: 0   Years of education: Not on file   Highest education level: 12th grade  Occupational History   Occupation: SELF EMPLOYED    Comment: retired   Occupation: Marine scientist: [employer]    Comment: 5 hours/ 5 days per week  Tobacco Use   Smoking status: Former    Current packs/day: 0.00    Average packs/day: 1 pack/day for 4.0 years (4.0 ttl pk-yrs)    Types: Cigarettes    Start date: 12/22/1964    Quit date: 12/22/1968    Years since quitting: 55.4   Smokeless tobacco: Never  Vaping Use   Vaping status: Never Used  Substance and Sexual Activity   Alcohol use: Yes    Comment: maybe one beer a month   Drug use: No   Sexual activity: Yes    Birth control/protection: None  Other Topics Concern   Not on file  Social History Narrative   Lives in one level home with his wife.   Very active - works at [employer], plays basketball, walks daily   Social Drivers of Health   Financial Resource Strain: Low Risk  (02/08/2024)   Overall Financial Resource Strain (CARDIA)    Difficulty of Paying Living  Expenses: Not very hard  Food Insecurity: No Food Insecurity (02/08/2024)   Hunger Vital Sign    Worried About Running Out of Food in the Last Year: Never true    Ran Out of Food in the Last Year: Never true  Transportation Needs: No Transportation Needs (02/08/2024)   PRAPARE - Administrator, Civil Service (Medical): No    Lack of Transportation (Non-Medical): No  Physical Activity: Sufficiently Active (02/08/2024)   Exercise Vital Sign    Days of Exercise per Week: 5 days    Minutes of Exercise per Session: 60 min  Stress: No Stress Concern Present (02/08/2024)   [employer] of Occupational Health - Occupational Stress Questionnaire    Feeling of Stress :  Not at all  Social Connections: Moderately Integrated (02/08/2024)   Social Connection and Isolation Panel [NHANES]    Frequency of Communication with Friends and Family: Once a week    Frequency of Social Gatherings with Friends and Family: Once a week    Attends Religious Services: More than 4 times per year    Active Member of [employer] or Organizations: Yes    Attends Banker Meetings: More than 4 times per year    Marital Status: Married  Catering manager Violence: Not At Risk (06/09/2023)   Humiliation, Afraid, Rape, and Kick questionnaire    Fear of Current or Ex-Partner: No    Emotionally Abused: No    Physically Abused: No    Sexually Abused: No    Outpatient Encounter Medications as of 05/20/2024  Medication Sig   aspirin  EC 81 MG tablet Take 81 mg by mouth daily. Swallow whole.   atorvastatin  (LIPITOR) 40 MG tablet Take 1 tablet by mouth once daily   Continuous Glucose Receiver (DEXCOM G6 RECEIVER) DEVI USE AS DIRECTED   Continuous Glucose Sensor (DEXCOM G6 SENSOR) MISC APPLY A NEW SENSOR EVERY 10 DAYS   Continuous Glucose Transmitter (DEXCOM G6 TRANSMITTER) MISC USE AS DIRECTED EVERY 3 MONTHS   dutasteride  (AVODART ) 0.5 MG capsule Take 1 capsule (0.5 mg total) by mouth daily.   famotidine   (PEPCID ) 20 MG tablet Take 20 mg by mouth 2 (two) times daily.   fluticasone (FLONASE) 50 MCG/ACT nasal spray Place 1 spray into both nostrils daily.   insulin  aspart (NOVOLOG ) 100 UNIT/ML FlexPen Inject 10-18 Units into the skin 3 (three) times daily before meals.   insulin  degludec (TRESIBA  FLEXTOUCH) 100 UNIT/ML FlexTouch Pen Inject 11 Units into the skin daily. Gets via novo nordisk patient assistance   Insulin  Pen Needle 29G X MISC Use to inject insulin  up to 6 times daily as directed. DX E11.65   levothyroxine  (SYNTHROID ) 175 MCG tablet TAKE 1 TABLET BY MOUTH ONCE DAILY BEFORE BREAKFAST   lisinopril  (ZESTRIL ) 5 MG tablet Take 1 tablet (5 mg total) by mouth daily.   MAGNESIUM PO Take 1 tablet by mouth daily.   OZEMPIC , 1 MG/DOSE, 4 MG/3ML SOPN Inject 1 mg into the skin every Sunday.   sildenafil  (VIAGRA ) 100 MG tablet TAKE 1 TABLET BY MOUTH ONCE DAILY AS NEEDED FOR ERECTILE DYSFUNCTION   [DISCONTINUED] finasteride  (PROSCAR ) 5 MG tablet Take 1 tablet by mouth once daily   [DISCONTINUED] lisinopril  (ZESTRIL ) 10 MG tablet Take 1 tablet by mouth once daily   No facility-administered encounter medications on file as of 05/20/2024.    Allergies  Allergen Reactions   Morphine  And Codeine Nausea Only    Pertinent ROS per HPI, otherwise unremarkable      Objective:  BP 117/66   Pulse (!) 6   Temp (!) 97.5 F (36.4 C)   Ht 6\' 4"  (1.93 m)   Wt 231 lb 6.4 oz (105 kg)   SpO2 96%   BMI 28.17 kg/m    Wt Readings from Last 3 Encounters:  05/20/24 231 lb 6.4 oz (105 kg)  02/09/24 238 lb 12.8 oz (108.3 kg)  08/07/23 231 lb 6.4 oz (105 kg)    Physical Exam Vitals and nursing note reviewed.  Constitutional:      General: He is not in acute distress.    Appearance: He is well-developed, well-groomed and overweight. He is not ill-appearing, toxic-appearing or diaphoretic.  HENT:     Head: Normocephalic and atraumatic.  Mouth/Throat:     Mouth: Mucous membranes are moist.   Eyes:     Conjunctiva/sclera: Conjunctivae normal.     Pupils: Pupils are equal, round, and reactive to light.  Cardiovascular:     Rate and Rhythm: Normal rate and regular rhythm.     Heart sounds: Normal heart sounds.  Pulmonary:     Effort: Pulmonary effort is normal.     Breath sounds: Normal breath sounds.  Musculoskeletal:     Cervical back: Neck supple.     Right lower leg: No edema.     Left lower leg: No edema.     Comments: Brace on right knee  Skin:    General: Skin is warm and dry.     Capillary Refill: Capillary refill takes less than 2 seconds.  Neurological:     General: No focal deficit present.     Mental Status: He is alert and oriented to person, place, and time.  Psychiatric:        Mood and Affect: Mood normal.        Behavior: Behavior normal. Behavior is cooperative.        Thought Content: Thought content normal.        Judgment: Judgment normal.       Results for orders placed or performed in visit on 04/12/24  BMP8+EGFR   Collection Time: 04/12/24 11:45 AM  Result Value Ref Range   Glucose 83 70 - 99 mg/dL   BUN 15 8 - 27 mg/dL   Creatinine, Ser 4.09 0.76 - 1.27 mg/dL   eGFR 60 >81 XB/JYN/8.29   BUN/Creatinine Ratio 12 10 - 24   Sodium 140 134 - 144 mmol/L   Potassium 4.9 3.5 - 5.2 mmol/L   Chloride 104 96 - 106 mmol/L   CO2 24 20 - 29 mmol/L   Calcium  9.9 8.6 - 10.2 mg/dL     EKG: SR 72, PR 562 ms, QT 396 ms, no acute ST-T changes, no ectopy. No significant changes from prior EKG. Kattie Parrot, FNP-C  Pertinent labs & imaging results that were available during my care of the patient were reviewed by me and considered in my medical decision making.  Assessment & Plan:  Callin was seen today for fatigue.  Diagnoses and all orders for this visit:  Malaise and fatigue -     CMP14+EGFR -     CBC with Differential/Platelet -     Thyroid  Panel With TSH -     Vitamin B12 -     VITAMIN D  25 Hydroxy (Vit-D Deficiency, Fractures) -      EKG 12-Lead  Hypertension associated with type 2 diabetes mellitus (HCC) -     lisinopril  (ZESTRIL ) 5 MG tablet; Take 1 tablet (5 mg total) by mouth daily. -     CMP14+EGFR -     CBC with Differential/Platelet -     Thyroid  Panel With TSH -     EKG 12-Lead  Coronary artery disease involving native coronary artery of native heart without angina pectoris -     CMP14+EGFR -     CBC with Differential/Platelet -     EKG 12-Lead  Acquired hypothyroidism -     Thyroid  Panel With TSH -     Vitamin B12 -     VITAMIN D  25 Hydroxy (Vit-D Deficiency, Fractures)      Fatigue Fatigue for 5-6 weeks without associated symptoms such as chest pain, leg swelling, fevers, chills, or diarrhea. Concerns about cardiovascular involvement  due to cardiovascular disease. Possible contribution from recent increase in Ozempic  dosage and thyroid  medication adjustment. Potential causes include cardiovascular issues, thyroid  dysfunction, vitamin deficiencies, and testosterone  levels. - Order EKG to compare with previous results - Check thyroid  function tests - Check vitamin B12 and vitamin D  levels - Evaluate testosterone  levels at a later time  Cardiovascular Disease No recent chest pain or palpitations. Fatigue could be related to cardiovascular issues. EKG will be performed to rule out cardiac changes. - Order EKG to assess for cardiac changes  Hypothyroidism Thyroid  medication dosage was doubled in February. Thyroid  function will be reassessed to determine if it is contributing to fatigue. - Check thyroid  function tests  Type 2 Diabetes Mellitus Blood sugars are variable, with a recent reading at 107 mg/dL. He is managing with carb counting and insulin  dosing. Concerns about brittle diabetes with fluctuating blood sugars. Discussed the importance of careful insulin  management to avoid hypoglycemia.  Low Free Testosterone  Previous tests showed low free testosterone  but normal total testosterone . Concerns  about testosterone  replacement therapy due to a 40% increased risk of heart attack. Consideration of over-the-counter options for management. - Evaluate testosterone  levels at a later time           Continue all other maintenance medications.  Follow up plan: Return if symptoms worsen or fail to improve.   Continue healthy lifestyle choices, including diet (rich in fruits, vegetables, and lean proteins, and low in salt and simple carbohydrates) and exercise (at least 30 minutes of moderate physical activity daily).   The above assessment and management plan was discussed with the patient. The patient verbalized understanding of and has agreed to the management plan. Patient is aware to call the clinic if they develop any new symptoms or if symptoms persist or worsen. Patient is aware when to return to the clinic for a follow-up visit. Patient educated on when it is appropriate to go to the emergency department.   Kattie Parrot, FNP-C [facility] [phone]

## 2024-05-21 LAB — THYROID PANEL WITH TSH
Free Thyroxine Index: 5.1 — ABNORMAL HIGH (ref 1.2–4.9)
T3 Uptake Ratio: 38 % (ref 24–39)
T4, Total: 13.5 ug/dL — ABNORMAL HIGH (ref 4.5–12.0)
TSH: 0.245 u[IU]/mL — ABNORMAL LOW (ref 0.450–4.500)

## 2024-05-21 LAB — CMP14+EGFR
ALT: 29 IU/L (ref 0–44)
AST: 28 IU/L (ref 0–40)
Albumin: 4.4 g/dL (ref 3.8–4.8)
Alkaline Phosphatase: 80 IU/L (ref 44–121)
BUN/Creatinine Ratio: 12 (ref 10–24)
BUN: 18 mg/dL (ref 8–27)
Bilirubin Total: 2.1 mg/dL — ABNORMAL HIGH (ref 0.0–1.2)
CO2: 20 mmol/L (ref 20–29)
Calcium: 9.8 mg/dL (ref 8.6–10.2)
Chloride: 101 mmol/L (ref 96–106)
Creatinine, Ser: 1.45 mg/dL — ABNORMAL HIGH (ref 0.76–1.27)
Globulin, Total: 2.2 g/dL (ref 1.5–4.5)
Glucose: 85 mg/dL (ref 70–99)
Potassium: 5.1 mmol/L (ref 3.5–5.2)
Sodium: 137 mmol/L (ref 134–144)
Total Protein: 6.6 g/dL (ref 6.0–8.5)
eGFR: 51 mL/min/{1.73_m2} — ABNORMAL LOW (ref >59)

## 2024-05-21 LAB — CBC WITH DIFFERENTIAL/PLATELET
Basophils Absolute: 0 10*3/uL (ref 0.0–0.2)
Basos: 1 %
EOS (ABSOLUTE): 0.1 10*3/uL (ref 0.0–0.4)
Eos: 1 %
Hematocrit: 42.4 % (ref 37.5–51.0)
Hemoglobin: 14.1 g/dL (ref 13.0–17.7)
Immature Grans (Abs): 0 10*3/uL (ref 0.0–0.1)
Immature Granulocytes: 1 %
Lymphocytes Absolute: 1.9 10*3/uL (ref 0.7–3.1)
Lymphs: 25 %
MCH: 31.8 pg (ref 26.6–33.0)
MCHC: 33.3 g/dL (ref 31.5–35.7)
MCV: 96 fL (ref 79–97)
Monocytes Absolute: 0.5 10*3/uL (ref 0.1–0.9)
Monocytes: 7 %
Neutrophils Absolute: 5 10*3/uL (ref 1.4–7.0)
Neutrophils: 65 %
Platelets: 234 10*3/uL (ref 150–450)
RBC: 4.43 x10E6/uL (ref 4.14–5.80)
RDW: 12.1 % (ref 11.6–15.4)
WBC: 7.5 10*3/uL (ref 3.4–10.8)

## 2024-05-21 LAB — VITAMIN D 25 HYDROXY (VIT D DEFICIENCY, FRACTURES): Vit D, 25-Hydroxy: 41.4 ng/mL (ref 30.0–100.0)

## 2024-05-21 LAB — VITAMIN B12: Vitamin B-12: 646 pg/mL (ref 232–1245)

## 2024-05-22 MED ORDER — LEVOTHYROXINE SODIUM 150 MCG PO TABS: 150.0000 ug | ORAL_TABLET | Freq: Every day | ORAL | 3 refills | Status: AC

## 2024-05-23 ENCOUNTER — Telehealth: Payer: Self-pay

## 2024-05-23 NOTE — Addendum Note (Signed)
 Addended by: Wynelle Heather on: 05/23/2024 09:54 AM   Modules accepted: Orders

## 2024-05-23 NOTE — Telephone Encounter (Signed)
 Jeffrey Shaw

## 2024-06-11 ENCOUNTER — Other Ambulatory Visit: Payer: Self-pay | Admitting: Family Medicine

## 2024-06-11 DIAGNOSIS — E1165 Type 2 diabetes mellitus with hyperglycemia: Secondary | ICD-10-CM

## 2024-06-23 ENCOUNTER — Other Ambulatory Visit: Payer: Self-pay | Admitting: Family Medicine

## 2024-06-23 DIAGNOSIS — E1165 Type 2 diabetes mellitus with hyperglycemia: Secondary | ICD-10-CM

## 2024-06-23 DIAGNOSIS — K080 Exfoliation of teeth due to systemic causes: Secondary | ICD-10-CM | POA: Diagnosis not present

## 2024-06-28 DIAGNOSIS — K080 Exfoliation of teeth due to systemic causes: Secondary | ICD-10-CM | POA: Diagnosis not present

## 2024-07-06 DIAGNOSIS — K080 Exfoliation of teeth due to systemic causes: Secondary | ICD-10-CM | POA: Diagnosis not present

## 2024-07-07 DIAGNOSIS — E139 Other specified diabetes mellitus without complications: Secondary | ICD-10-CM | POA: Diagnosis not present

## 2024-07-07 DIAGNOSIS — E7800 Pure hypercholesterolemia, unspecified: Secondary | ICD-10-CM | POA: Diagnosis not present

## 2024-07-07 DIAGNOSIS — E039 Hypothyroidism, unspecified: Secondary | ICD-10-CM | POA: Diagnosis not present

## 2024-07-07 DIAGNOSIS — I10 Essential (primary) hypertension: Secondary | ICD-10-CM | POA: Diagnosis not present

## 2024-07-08 ENCOUNTER — Other Ambulatory Visit: Payer: Self-pay | Admitting: Family Medicine

## 2024-07-08 DIAGNOSIS — E039 Hypothyroidism, unspecified: Secondary | ICD-10-CM

## 2024-07-15 ENCOUNTER — Other Ambulatory Visit

## 2024-07-15 DIAGNOSIS — E039 Hypothyroidism, unspecified: Secondary | ICD-10-CM | POA: Diagnosis not present

## 2024-07-15 DIAGNOSIS — K080 Exfoliation of teeth due to systemic causes: Secondary | ICD-10-CM | POA: Diagnosis not present

## 2024-07-16 LAB — THYROID PANEL WITH TSH
Free Thyroxine Index: 2.6 (ref 1.2–4.9)
T3 Uptake Ratio: 28 % (ref 24–39)
T4, Total: 9.4 ug/dL (ref 4.5–12.0)
TSH: 2.93 u[IU]/mL (ref 0.450–4.500)

## 2024-07-22 DIAGNOSIS — H40033 Anatomical narrow angle, bilateral: Secondary | ICD-10-CM | POA: Diagnosis not present

## 2024-07-22 DIAGNOSIS — M25562 Pain in left knee: Secondary | ICD-10-CM | POA: Diagnosis not present

## 2024-07-22 DIAGNOSIS — M25561 Pain in right knee: Secondary | ICD-10-CM | POA: Diagnosis not present

## 2024-07-22 DIAGNOSIS — H2513 Age-related nuclear cataract, bilateral: Secondary | ICD-10-CM | POA: Diagnosis not present

## 2024-07-22 DIAGNOSIS — M170 Bilateral primary osteoarthritis of knee: Secondary | ICD-10-CM | POA: Diagnosis not present

## 2024-08-05 ENCOUNTER — Telehealth: Payer: Self-pay | Admitting: Pharmacist

## 2024-08-05 NOTE — Telephone Encounter (Signed)
   Call placed to patient to inform him of medications available for pick up at practice.  He is currently managed by endocrinology for his T2DM.  Offered for Dexcom G7 CGM to replace G6 if he becomes interested.  He is still struggling with hypoglycemia at times.  Would consider changing Tresiba  to a Lantus  or Basaglar  as Tresiba  might be hanging around too long.  Defer to endocrine for management and adjustments.  Compliant with statin/ACEi.   Jacob Chamblee Dattero Dewaine Morocho, PharmD, BCACP, CPP Clinical Pharmacist, [facility]

## 2024-08-09 ENCOUNTER — Ambulatory Visit (INDEPENDENT_AMBULATORY_CARE_PROVIDER_SITE_OTHER): Payer: Medicare Other | Admitting: Family Medicine

## 2024-08-09 ENCOUNTER — Encounter: Payer: Self-pay | Admitting: Family Medicine

## 2024-08-09 VITALS — BP 125/74 | HR 73 | Temp 97.7°F | Ht 76.0 in | Wt 225.4 lb

## 2024-08-09 DIAGNOSIS — L821 Other seborrheic keratosis: Secondary | ICD-10-CM

## 2024-08-09 DIAGNOSIS — N289 Disorder of kidney and ureter, unspecified: Secondary | ICD-10-CM

## 2024-08-09 DIAGNOSIS — E1159 Type 2 diabetes mellitus with other circulatory complications: Secondary | ICD-10-CM | POA: Diagnosis not present

## 2024-08-09 DIAGNOSIS — I700 Atherosclerosis of aorta: Secondary | ICD-10-CM

## 2024-08-09 DIAGNOSIS — E1169 Type 2 diabetes mellitus with other specified complication: Secondary | ICD-10-CM

## 2024-08-09 DIAGNOSIS — E1165 Type 2 diabetes mellitus with hyperglycemia: Secondary | ICD-10-CM

## 2024-08-09 DIAGNOSIS — E785 Hyperlipidemia, unspecified: Secondary | ICD-10-CM

## 2024-08-09 DIAGNOSIS — R0781 Pleurodynia: Secondary | ICD-10-CM

## 2024-08-09 DIAGNOSIS — Z794 Long term (current) use of insulin: Secondary | ICD-10-CM

## 2024-08-09 DIAGNOSIS — I152 Hypertension secondary to endocrine disorders: Secondary | ICD-10-CM | POA: Diagnosis not present

## 2024-08-09 DIAGNOSIS — E039 Hypothyroidism, unspecified: Secondary | ICD-10-CM

## 2024-08-09 NOTE — Patient Instructions (Addendum)

## 2024-08-09 NOTE — Progress Notes (Signed)
 Subjective:  Patient ID: Jeffrey Shaw, male    DOB: 07-15-1951, 73 y.o.   MRN: [id number]  Patient Care Team: Severa Rock HERO, FNP as PCP - General (Family Medicine) Ladora Ross Lacy Phebe, MD as Referring Physician (Optometry) Billee, Mliss BIRCH, [employer] as Pharmacist (Family Medicine) Tommas Pears, MD as Referring Physician (Endocrinology) Sherrilee Belvie LITTIE, MD as Consulting Physician (Urology)   Chief Complaint:  Medical Management of Chronic Issues (6 month follow up )   HPI: Jeffrey Shaw is a 73 y.o. male presenting on 08/09/2024 for Medical Management of Chronic Issues (6 month follow up )   Jeffrey Shaw is a 73 year old male who presents with rib pain following a fall.  He experienced rib pain after a fall last Friday while retrieving his lawn mower. He tripped on the mower, fell, and his elbow jabbed into his ribs, accompanied by a cracking sound. The pain is localized to the left ribs and is particularly sore when sneezing or lying down at night. No significant pain or shortness of breath has been noted since the incident, and he has not sought medical attention for the injury.  He has a history of diabetes with a recent A1c of 6.2. He last saw endocrinology in May. He also reports a decline in kidney function with a GFR dropping from 60 in April to 51 in May. He drinks at least 60 ounces of water  a day and denies any NSAID use. There have been no changes in urine output.  He has a history of aortic atherosclerosis and is on atorvastatin  but has not been taking aspirin  regularly due to concerns about bleeding from minor cuts. No issues with blood pressure have been reported.  He mentions worsening vision in the left eye, requiring a new lens, and notes improved energy levels after thyroid  labs returned to normal.  He receives steroid injections in his knees every three months for pain management, which help, although pain returns about a week and a half before the next injection is  due.  He has noticed some dry skin or lesions on his head, identified by his wife Jeffrey Shaw, and plans to see a dermatologist for evaluation. He has not seen a dermatologist in about 15 years.          Relevant past medical, surgical, family, and social history reviewed and updated as indicated.  Allergies and medications reviewed and updated. Data reviewed: Chart in Epic.   Past Medical History:  Diagnosis Date   Allergy morphine    Arthritis    CAD (coronary artery disease)    CABG 01/2008 /  Catheterization, November, 2012, LIMA to the LAD patent,  70% distal left main stenosis involving the proximal circumflex, DES to the left main continuing into the circumflex   CORONARY ARTERY BYPASS GRAFT, HX OF 01/17/2010   Qualifier: Diagnosis of  By: Micky, MD, [facility], [place] [facility]    Diabetes mellitus    Dr. Alferd, [facility]   GERD (gastroesophageal reflux disease)    HTN (hypertension)    Hx of CABG    2009, LIMA to LAD, right radial to PDA, SVG to diagonal, SVG to OM1   Hyperlipemia    Hypothyroid    Primary osteoarthritis of left hip 04/18/2016    Past Surgical History:  Procedure Laterality Date   BACK SURGERY     CARPAL TUNNEL RELEASE     left hand   CATARACT EXTRACTION Bilateral    CORONARY ANGIOPLASTY  CORONARY ARTERY BYPASS GRAFT  01/2008   X 6   INGUINAL HERNIA REPAIR     right   INGUINAL HERNIA REPAIR Left 09/13/2021   Procedure: HERNIA REPAIR INGUINAL ADULT W/MESH;  Surgeon: Mavis Anes, MD;  Location: [facility];  Service: General;  Laterality: Left;   JOINT REPLACEMENT  hip   LEFT HEART CATHETERIZATION WITH CORONARY/GRAFT ANGIOGRAM N/A 10/30/2011   Procedure: LEFT HEART CATHETERIZATION WITH EL BILE;  Surgeon: Debby JONETTA Como, MD;  Location: [facility] CATH LAB;  Service: Cardiovascular;  Laterality: N/A;   LUMBAR DISC SURGERY     removed   PERCUTANEOUS CORONARY STENT INTERVENTION (PCI-S) N/A 11/03/2011   Procedure: PERCUTANEOUS CORONARY STENT INTERVENTION  (PCI-S);  Surgeon: Lonni JONETTA Cash, MD;  Location: [facility] CATH LAB;  Service: Cardiovascular;  Laterality: N/A;   TONSILLECTOMY  ~ 1955   TOTAL HIP ARTHROPLASTY Left 04/18/2016   Procedure: TOTAL HIP ARTHROPLASTY;  Surgeon: Toribio Silos, MD;  Location: [facility] OR;  Service: Orthopedics;  Laterality: Left;   UMBILICAL HERNIA REPAIR N/A 09/13/2021   Procedure: HERNIA REPAIR UMBILICAL ADULT W/MESH;  Surgeon: Mavis Anes, MD;  Location: [facility];  Service: General;  Laterality: N/A;   URETEROLITHOTOMY Left    with stone basket removal   XI ROBOTIC ASSISTED INGUINAL HERNIA REPAIR WITH MESH Left 10/30/2022   Procedure: XI ROBOTIC ASSISTED INGUINAL HERNIA REPAIR WITH MESH;  Surgeon: Mavis Anes, MD;  Location: [facility];  Service: General;  Laterality: Left;    Social History   Socioeconomic History   Marital status: Married    Spouse name: SANDRA   Number of children: 0   Years of education: Not on file   Highest education level: 12th grade  Occupational History   Occupation: SELF EMPLOYED    Comment: retired   Occupation: Marine scientist: [employer]    Comment: 5 hours/ 5 days per week  Tobacco Use   Smoking status: Former    Current packs/day: 0.00    Average packs/day: 1 pack/day for 4.0 years (4.0 ttl pk-yrs)    Types: Cigarettes    Start date: 12/22/1964    Quit date: 12/22/1968    Years since quitting: 55.6   Smokeless tobacco: Never  Vaping Use   Vaping status: Never Used  Substance and Sexual Activity   Alcohol use: Yes    Comment: maybe one beer a month   Drug use: No   Sexual activity: Yes    Birth control/protection: None  Other Topics Concern   Not on file  Social History Narrative   Lives in one level home with his wife.   Very active - works at [employer], plays basketball, walks daily   Social Drivers of Health   Financial Resource Strain: Medium Risk (08/02/2024)   Overall Financial Resource Strain (CARDIA)    Difficulty of Paying Living Expenses:  Somewhat hard  Food Insecurity: No Food Insecurity (08/02/2024)   Hunger Vital Sign    Worried About Running Out of Food in the Last Year: Never true    Ran Out of Food in the Last Year: Never true  Transportation Needs: No Transportation Needs (08/02/2024)   PRAPARE - Administrator, Civil Service (Medical): No    Lack of Transportation (Non-Medical): No  Physical Activity: Sufficiently Active (08/02/2024)   Exercise Vital Sign    Days of Exercise per Week: 4 days    Minutes of Exercise per Session: 60 min  Stress: No Stress Concern Present (08/02/2024)  [employer] of Occupational Health - Occupational Stress Questionnaire    Feeling of Stress: Not at all  Social Connections: Unknown (08/02/2024)   Social Connection and Isolation Panel    Frequency of Communication with Friends and Family: Twice a week    Frequency of Social Gatherings with Friends and Family: Patient declined    Attends Religious Services: More than 4 times per year    Active Member of [employer] or Organizations: No    Attends Engineer, structural: Not on file    Marital Status: Married  Catering manager Violence: Not At Risk (06/09/2023)   Humiliation, Afraid, Rape, and Kick questionnaire    Fear of Current or Ex-Partner: No    Emotionally Abused: No    Physically Abused: No    Sexually Abused: No    Outpatient Encounter Medications as of 08/09/2024  Medication Sig   aspirin  EC 81 MG tablet Take 81 mg by mouth daily. Swallow whole.   atorvastatin  (LIPITOR) 40 MG tablet Take 1 tablet by mouth once daily   Continuous Glucose Receiver (DEXCOM G6 RECEIVER) DEVI USE AS DIRECTED   Continuous Glucose Sensor (DEXCOM G6 SENSOR) MISC APPLY A NEW SENSOR TOPICALLY EVERY 10 DAYS   Continuous Glucose Transmitter (DEXCOM G6 TRANSMITTER) MISC AS DIRECTED EVERY  3  MONTHS   dutasteride  (AVODART ) 0.5 MG capsule Take 1 capsule (0.5 mg total) by mouth daily.   famotidine  (PEPCID ) 20 MG tablet Take 20 mg by  mouth 2 (two) times daily.   fluticasone (FLONASE) 50 MCG/ACT nasal spray Place 1 spray into both nostrils daily.   insulin  aspart (NOVOLOG ) 100 UNIT/ML FlexPen Inject 10-18 Units into the skin 3 (three) times daily before meals.   insulin  degludec (TRESIBA  FLEXTOUCH) 100 UNIT/ML FlexTouch Pen Inject 11 Units into the skin daily. Gets via novo nordisk patient assistance   Insulin  Pen Needle 29G X MISC Use to inject insulin  up to 6 times daily as directed. DX E11.65   levothyroxine  (SYNTHROID ) 150 MCG tablet Take 1 tablet (150 mcg total) by mouth daily.   lisinopril  (ZESTRIL ) 5 MG tablet Take 1 tablet (5 mg total) by mouth daily.   MAGNESIUM PO Take 1 tablet by mouth daily.   OZEMPIC , 1 MG/DOSE, 4 MG/3ML SOPN Inject 1 mg into the skin every Sunday.   sildenafil  (VIAGRA ) 100 MG tablet TAKE 1 TABLET BY MOUTH ONCE DAILY AS NEEDED FOR ERECTILE DYSFUNCTION   No facility-administered encounter medications on file as of 08/09/2024.    Allergies  Allergen Reactions   Morphine  And Codeine Nausea Only    Pertinent ROS per HPI, otherwise unremarkable      Objective:  BP 125/74   Pulse 73   Temp 97.7 F (36.5 C)   Ht 6' 4 (1.93 m)   Wt 225 lb 6.4 oz (102.2 kg)   SpO2 97%   BMI 27.44 kg/m    Wt Readings from Last 3 Encounters:  08/09/24 225 lb 6.4 oz (102.2 kg)  05/20/24 231 lb 6.4 oz (105 kg)  02/09/24 238 lb 12.8 oz (108.3 kg)    Physical Exam Vitals and nursing note reviewed.  Constitutional:      General: He is not in acute distress.    Appearance: Normal appearance. He is not ill-appearing, toxic-appearing or diaphoretic.  HENT:     Head: Normocephalic and atraumatic.     Nose: Nose normal.     Mouth/Throat:     Mouth: Mucous membranes are moist.  Eyes:  Conjunctiva/sclera: Conjunctivae normal.     Pupils: Pupils are equal, round, and reactive to light.  Cardiovascular:     Rate and Rhythm: Normal rate and regular rhythm.     Heart sounds: Normal heart sounds.   Pulmonary:     Effort: Pulmonary effort is normal.     Breath sounds: Normal breath sounds.  Musculoskeletal:     Cervical back: Neck supple.     Right lower leg: No edema.     Left lower leg: No edema.     Comments: Wrap on right knee  Skin:    General: Skin is warm and dry.     Capillary Refill: Capillary refill takes less than 2 seconds.     Findings: Lesion present.      Neurological:     General: No focal deficit present.     Mental Status: He is alert and oriented to person, place, and time.  Psychiatric:        Mood and Affect: Mood normal.        Behavior: Behavior normal.        Thought Content: Thought content normal.        Judgment: Judgment normal.      Results for orders placed or performed in visit on 07/15/24  Thyroid  Panel With TSH   Collection Time: 07/15/24 10:47 AM  Result Value Ref Range   TSH 2.930 0.450 - 4.500 uIU/mL   T4, Total 9.4 4.5 - 12.0 ug/dL   T3 Uptake Ratio 28 24 - 39 %   Free Thyroxine Index 2.6 1.2 - 4.9       Pertinent labs & imaging results that were available during my care of the patient were reviewed by me and considered in my medical decision making.  Assessment & Plan:  Ajit was seen today for medical management of chronic issues.  Diagnoses and all orders for this visit:  Hypertension associated with type 2 diabetes mellitus (HCC) -     BMP8+EGFR  Hyperlipidemia associated with type 2 diabetes mellitus (HCC) -     BMP8+EGFR  Controlled type 2 diabetes mellitus with hyperglycemia, with long-term current use of insulin  (HCC) -     BMP8+EGFR  Renal function impairment -     BMP8+EGFR  Aortic atherosclerosis (HCC) -     BMP8+EGFR  Acquired hypothyroidism -     Thyroid  Panel With TSH  Seborrheic keratoses -     Ambulatory referral to Dermatology  Rib pain on left side     Left rib contusion Sustained from a fall while tripping over a lawn mower. Reports soreness in the left ribs, especially at night, but no  significant pain or shortness of breath. Good lung sounds and air movement indicate no pneumothorax. Expected to heal in 6-8 weeks. - Instruct to monitor for worsening symptoms or breathing difficulties and report if he occurs  Kidney impairment Recent decline in kidney function with GFR dropping from 60 in April to 51 in May. No NSAID use reported. Emphasized importance of hydration and controlling blood sugar and blood pressure. - Order BMP to assess renal function - Encourage increased water  intake - Advise avoiding NSAIDs  Type 2 diabetes mellitus without complications A1c is well-controlled at 6.2. Continues to manage diabetes effectively.  Aortic atherosclerosis Aortic atherosclerosis noted on imaging. Continues atorvastatin  for plaque prevention. Discussed the importance of aspirin  for prevention, but he avoids it in summer due to bleeding risk from nicks. - Encourage taking aspirin  every other day as  a preventive measure  Primary hypothyroidism Thyroid  labs are stable, and he reports improved energy levels since thyroid  function normalized. - Order thyroid  function tests to ensure stability  Bilateral knee osteoarthritis Receives steroid injections every three months for knee pain, which are effective. Discussed potential for regenerative medicine options like PRP or growth factor injections. - Discuss regenerative medicine options with orthopedic specialist - Consider PRP or growth factor injections if recommended  Seborrheic keratosis, scalp Multiple lesions on the scalp suspected to be SK. Advised to have them evaluated and treated to prevent progression to skin cancer. - Refer to dermatology for evaluation and treatment of scalp lesions          Continue all other maintenance medications.  Follow up plan: Return in about 6 months (around 02/09/2025) for Annual Physical.   Continue healthy lifestyle choices, including diet (rich in fruits, vegetables, and lean  proteins, and low in salt and simple carbohydrates) and exercise (at least 30 minutes of moderate physical activity daily).  Educational handout given for DM  The above assessment and management plan was discussed with the patient. The patient verbalized understanding of and has agreed to the management plan. Patient is aware to call the clinic if they develop any new symptoms or if symptoms persist or worsen. Patient is aware when to return to the clinic for a follow-up visit. Patient educated on when it is appropriate to go to the emergency department.   Rosaline Bruns, FNP-C [facility] [phone]

## 2024-08-10 ENCOUNTER — Ambulatory Visit: Payer: Self-pay | Admitting: Family Medicine

## 2024-08-10 DIAGNOSIS — E875 Hyperkalemia: Secondary | ICD-10-CM

## 2024-08-10 LAB — BMP8+EGFR
BUN/Creatinine Ratio: 15 (ref 10–24)
BUN: 20 mg/dL (ref 8–27)
CO2: 19 mmol/L — ABNORMAL LOW (ref 20–29)
Calcium: 9.8 mg/dL (ref 8.6–10.2)
Chloride: 102 mmol/L (ref 96–106)
Creatinine, Ser: 1.36 mg/dL — ABNORMAL HIGH (ref 0.76–1.27)
Glucose: 150 mg/dL — ABNORMAL HIGH (ref 70–99)
Potassium: 5.6 mmol/L — ABNORMAL HIGH (ref 3.5–5.2)
Sodium: 137 mmol/L (ref 134–144)
eGFR: 55 mL/min/1.73 — ABNORMAL LOW (ref >59)

## 2024-08-10 LAB — THYROID PANEL WITH TSH
Free Thyroxine Index: 2.8 (ref 1.2–4.9)
T3 Uptake Ratio: 33 (ref 24–39)
T4, Total: 8.4 ug/dL (ref 4.5–12.0)
TSH: 7.79 u[IU]/mL — ABNORMAL HIGH (ref 0.450–4.500)

## 2024-08-11 ENCOUNTER — Other Ambulatory Visit

## 2024-08-11 DIAGNOSIS — E875 Hyperkalemia: Secondary | ICD-10-CM | POA: Diagnosis not present

## 2024-08-12 ENCOUNTER — Ambulatory Visit: Payer: Self-pay | Admitting: Family Medicine

## 2024-08-12 DIAGNOSIS — E875 Hyperkalemia: Secondary | ICD-10-CM

## 2024-08-12 LAB — BASIC METABOLIC PANEL WITH GFR
BUN/Creatinine Ratio: 14 (ref 10–24)
BUN: 17 mg/dL (ref 8–27)
CO2: 19 mmol/L — ABNORMAL LOW (ref 20–29)
Calcium: 9.3 mg/dL (ref 8.6–10.2)
Chloride: 103 mmol/L (ref 96–106)
Creatinine, Ser: 1.22 mg/dL (ref 0.76–1.27)
Glucose: 154 mg/dL — ABNORMAL HIGH (ref 70–99)
Potassium: 5.7 mmol/L — ABNORMAL HIGH (ref 3.5–5.2)
Sodium: 137 mmol/L (ref 134–144)
eGFR: 62 mL/min/1.73 (ref >59)

## 2024-08-12 MED ORDER — SODIUM POLYSTYRENE SULFONATE 15 GM/60ML CO SUSP: 15.0000 g | Freq: Once | 0 refills | Status: AC

## 2024-08-15 ENCOUNTER — Other Ambulatory Visit

## 2024-08-15 DIAGNOSIS — E875 Hyperkalemia: Secondary | ICD-10-CM

## 2024-08-16 ENCOUNTER — Ambulatory Visit: Payer: Self-pay | Admitting: Family Medicine

## 2024-08-16 LAB — BASIC METABOLIC PANEL WITH GFR
BUN/Creatinine Ratio: 13 (ref 10–24)
BUN: 17 mg/dL (ref 8–27)
CO2: 20 mmol/L (ref 20–29)
Calcium: 9.3 mg/dL (ref 8.6–10.2)
Chloride: 100 mmol/L (ref 96–106)
Creatinine, Ser: 1.33 mg/dL — ABNORMAL HIGH (ref 0.76–1.27)
Glucose: 154 mg/dL — ABNORMAL HIGH (ref 70–99)
Potassium: 5.2 mmol/L (ref 3.5–5.2)
Sodium: 133 mmol/L — ABNORMAL LOW (ref 134–144)
eGFR: 56 mL/min/1.73 — ABNORMAL LOW (ref >59)

## 2024-08-26 ENCOUNTER — Encounter

## 2024-08-30 DIAGNOSIS — L570 Actinic keratosis: Secondary | ICD-10-CM | POA: Diagnosis not present

## 2024-08-30 DIAGNOSIS — L820 Inflamed seborrheic keratosis: Secondary | ICD-10-CM | POA: Diagnosis not present

## 2024-08-30 DIAGNOSIS — Z1283 Encounter for screening for malignant neoplasm of skin: Secondary | ICD-10-CM | POA: Diagnosis not present

## 2024-08-30 DIAGNOSIS — D225 Melanocytic nevi of trunk: Secondary | ICD-10-CM | POA: Diagnosis not present

## 2024-08-30 DIAGNOSIS — X32XXXA Exposure to sunlight, initial encounter: Secondary | ICD-10-CM | POA: Diagnosis not present

## 2024-09-08 ENCOUNTER — Telehealth: Payer: Self-pay

## 2024-09-08 NOTE — Telephone Encounter (Signed)
 Jeffrey Shaw

## 2024-09-21 ENCOUNTER — Other Ambulatory Visit: Payer: Self-pay | Admitting: Family Medicine

## 2024-09-21 DIAGNOSIS — E1165 Type 2 diabetes mellitus with hyperglycemia: Secondary | ICD-10-CM

## 2024-10-18 ENCOUNTER — Ambulatory Visit (INDEPENDENT_AMBULATORY_CARE_PROVIDER_SITE_OTHER): Admitting: Family Medicine

## 2024-10-18 ENCOUNTER — Encounter: Payer: Self-pay | Admitting: Family Medicine

## 2024-10-18 VITALS — BP 168/85 | HR 67 | Temp 95.7°F | Ht 76.0 in | Wt 238.6 lb

## 2024-10-18 DIAGNOSIS — I8390 Asymptomatic varicose veins of unspecified lower extremity: Secondary | ICD-10-CM

## 2024-10-18 DIAGNOSIS — M25561 Pain in right knee: Secondary | ICD-10-CM

## 2024-10-18 DIAGNOSIS — M25472 Effusion, left ankle: Secondary | ICD-10-CM | POA: Diagnosis not present

## 2024-10-18 DIAGNOSIS — G8929 Other chronic pain: Secondary | ICD-10-CM | POA: Diagnosis not present

## 2024-10-18 DIAGNOSIS — N1831 Chronic kidney disease, stage 3a: Secondary | ICD-10-CM

## 2024-10-18 NOTE — Patient Instructions (Signed)
 PRP therapy for knees or gel

## 2024-10-18 NOTE — Progress Notes (Signed)
 Subjective:  Patient ID: Jeffrey Shaw, male    DOB: [birth year], 73 y.o.   MRN: [id number]  Patient Care Team: Severa Rock HERO, FNP as PCP - General (Family Medicine) Ladora Ross Lacy Phebe, MD as Referring Physician (Optometry) Billee, Mliss BIRCH, [facility] as Pharmacist (Family Medicine) Tommas Pears, MD as Referring Physician (Endocrinology) Sherrilee Belvie LITTIE, MD as Consulting Physician (Urology)   Chief Complaint:  Edema (Left ankle x 2 months )   HPI: Jeffrey Shaw is a 73 y.o. male presenting on 10/18/2024 for Edema (Left ankle x 2 months )   Jeffrey Shaw is a 73 year old male with venous insufficiency who presents with left ankle swelling.  He has swelling in his left ankle without associated pain. The swelling does not worsen throughout the day, and the patient reports that this is about as bad as it will get. Compression socks provide some relief, but not much. He has not undergone any venous insufficiency studies in the past.  No shortness of breath, fatigue, chest pain, changes in urine output, palpitations, or trouble sleeping. He does not need to prop himself up on more than two pillows at night.  He has a history of kidney problems, with a creatinine level that increased to 1.33 and a GFR that dropped to 56 in August. He is concerned about his kidney function and its potential impact on his current condition.  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 He notes that his hands are cold and that he feels colder as he ages.          Relevant past medical, surgical, family, and social history reviewed and updated as indicated.  Allergies and medications reviewed and updated. Data reviewed: Chart in Epic.   Past Medical History:  Diagnosis Date   Allergy morphine    Arthritis    CAD (coronary artery disease)    CABG 01/2008 /  Catheterization, November, 2012, LIMA to the LAD patent,  70% distal left  main stenosis involving the proximal circumflex, DES to the left main continuing into the circumflex   CORONARY ARTERY BYPASS GRAFT, HX OF 01/17/2010   Qualifier: Diagnosis of  By: Micky, MD, [facility], Reyes Lenis    Diabetes mellitus    Dr. Alferd, [facility]   GERD (gastroesophageal reflux disease)    HTN (hypertension)    Hx of CABG    2009, LIMA to LAD, right radial to PDA, SVG to diagonal, SVG to OM1   Hyperlipemia    Hypothyroid    Primary osteoarthritis of left hip 04/18/2016    Past Surgical History:  Procedure Laterality Date   BACK SURGERY     CARPAL TUNNEL RELEASE     left hand   CATARACT EXTRACTION Bilateral    CORONARY ANGIOPLASTY     CORONARY ARTERY BYPASS GRAFT  01/2008   X 6   INGUINAL HERNIA REPAIR     right   INGUINAL HERNIA REPAIR Left 09/13/2021   Procedure: HERNIA REPAIR INGUINAL ADULT W/MESH;  Surgeon: Mavis Anes, MD;  Location: [facility];  Service: General;  Laterality: Left;   JOINT REPLACEMENT  hip   LEFT HEART CATHETERIZATION WITH CORONARY/GRAFT ANGIOGRAM N/A 10/30/2011   Procedure: LEFT HEART CATHETERIZATION WITH EL BILE;  Surgeon: Debby BIRCH Como, MD;  Location: [facility] CATH LAB;  Service: Cardiovascular;  Laterality: N/A;   LUMBAR DISC SURGERY     removed   PERCUTANEOUS  CORONARY STENT INTERVENTION (PCI-S) N/A 11/03/2011   Procedure: PERCUTANEOUS CORONARY STENT INTERVENTION (PCI-S);  Surgeon: Lonni JONETTA Cash, MD;  Location: [facility] CATH LAB;  Service: Cardiovascular;  Laterality: N/A;   TONSILLECTOMY  ~ 1955   TOTAL HIP ARTHROPLASTY Left 04/18/2016   Procedure: TOTAL HIP ARTHROPLASTY;  Surgeon: Toribio Silos, MD;  Location: [facility] OR;  Service: Orthopedics;  Laterality: Left;   UMBILICAL HERNIA REPAIR N/A 09/13/2021   Procedure: HERNIA REPAIR UMBILICAL ADULT W/MESH;  Surgeon: Mavis Anes, MD;  Location: [facility];  Service: General;  Laterality: N/A;   URETEROLITHOTOMY Left    with stone basket removal   XI ROBOTIC ASSISTED INGUINAL HERNIA  REPAIR WITH MESH Left 10/30/2022   Procedure: XI ROBOTIC ASSISTED INGUINAL HERNIA REPAIR WITH MESH;  Surgeon: Mavis Anes, MD;  Location: [facility];  Service: General;  Laterality: Left;    Social History   Socioeconomic History   Marital status: Married    Spouse name: SANDRA   Number of children: 0   Years of education: Not on file   Highest education level: 12th grade  Occupational History   Occupation: SELF EMPLOYED    Comment: retired   Occupation: Marine Scientist: [employer]    Comment: 5 hours/ 5 days per week  Tobacco Use   Smoking status: Former    Current packs/day: 0.00    Average packs/day: 1 pack/day for 4.0 years (4.0 ttl pk-yrs)    Types: Cigarettes    Start date: 12/22/1964    Quit date: 12/22/1968    Years since quitting: 55.8   Smokeless tobacco: Never  Vaping Use   Vaping status: Never Used  Substance and Sexual Activity   Alcohol use: Yes    Comment: maybe one beer a month   Drug use: No   Sexual activity: Yes    Birth control/protection: None  Other Topics Concern   Not on file  Social History Narrative   Lives in one level home with his wife.   Very active - works at [employer], plays basketball, walks daily   Social Drivers of Health   Financial Resource Strain: Low Risk  (10/17/2024)   Overall Financial Resource Strain (CARDIA)    Difficulty of Paying Living Expenses: Not very hard  Recent Concern: Financial Resource Strain - Medium Risk (08/02/2024)   Overall Financial Resource Strain (CARDIA)    Difficulty of Paying Living Expenses: Somewhat hard  Food Insecurity: No Food Insecurity (10/17/2024)   Hunger Vital Sign    Worried About Running Out of Food in the Last Year: Never true    Ran Out of Food in the Last Year: Never true  Transportation Needs: No Transportation Needs (10/17/2024)   PRAPARE - Administrator, Civil Service (Medical): No    Lack of Transportation (Non-Medical): No  Physical Activity: Sufficiently Active  (10/17/2024)   Exercise Vital Sign    Days of Exercise per Week: 4 days    Minutes of Exercise per Session: 60 min  Stress: No Stress Concern Present (10/17/2024)   [employer] of Occupational Health - Occupational Stress Questionnaire    Feeling of Stress: Not at all  Social Connections: Socially Integrated (10/17/2024)   Social Connection and Isolation Panel    Frequency of Communication with Friends and Family: Twice a week    Frequency of Social Gatherings with Friends and Family: Twice a week    Attends Religious Services: More than 4 times per year    Active Member  of Clubs or Organizations: Yes    Attends Banker Meetings: More than 4 times per year    Marital Status: Married  Catering Manager Violence: Not At Risk (06/09/2023)   Humiliation, Afraid, Rape, and Kick questionnaire    Fear of Current or Ex-Partner: No    Emotionally Abused: No    Physically Abused: No    Sexually Abused: No    Outpatient Encounter Medications as of 10/18/2024  Medication Sig   aspirin  EC 81 MG tablet Take 81 mg by mouth daily. Swallow whole.   atorvastatin  (LIPITOR) 40 MG tablet Take 1 tablet by mouth once daily   Continuous Glucose Receiver (DEXCOM G6 RECEIVER) DEVI USE AS DIRECTED   Continuous Glucose Sensor (DEXCOM G6 SENSOR) MISC APPLY A NEW SENSOR TOPICALLY EVERY 10 DAYS   Continuous Glucose Transmitter (DEXCOM G6 TRANSMITTER) MISC USE AS DIRECTED EVERY  3  MONTHS   dutasteride  (AVODART ) 0.5 MG capsule Take 1 capsule (0.5 mg total) by mouth daily.   famotidine  (PEPCID ) 20 MG tablet Take 20 mg by mouth 2 (two) times daily.   fluticasone (FLONASE) 50 MCG/ACT nasal spray Place 1 spray into both nostrils daily.   insulin  aspart (NOVOLOG ) 100 UNIT/ML FlexPen Inject 10-18 Units into the skin 3 (three) times daily before meals.   insulin  degludec (TRESIBA  FLEXTOUCH) 100 UNIT/ML FlexTouch Pen Inject 11 Units into the skin daily. Gets via novo nordisk patient assistance   Insulin   Pen Needle 29G X MISC Use to inject insulin  up to 6 times daily as directed. DX E11.65   levothyroxine  (SYNTHROID ) 150 MCG tablet Take 1 tablet (150 mcg total) by mouth daily.   lisinopril  (ZESTRIL ) 5 MG tablet Take 1 tablet (5 mg total) by mouth daily.   MAGNESIUM PO Take 1 tablet by mouth daily.   OZEMPIC , 1 MG/DOSE, 4 MG/3ML SOPN Inject 1 mg into the skin every Sunday.   sildenafil  (VIAGRA ) 100 MG tablet TAKE 1 TABLET BY MOUTH ONCE DAILY AS NEEDED FOR ERECTILE DYSFUNCTION   No facility-administered encounter medications on file as of 10/18/2024.    Allergies  Allergen Reactions   Morphine  And Codeine Nausea Only    Pertinent ROS per HPI, otherwise unremarkable      Objective:  BP (!) 168/85   Pulse 67   Temp (!) 95.7 F (35.4 C)   Ht 6' 4 (1.93 m)   Wt 238 lb 9.6 oz (108.2 kg)   SpO2 98%   BMI 29.04 kg/m    Wt Readings from Last 3 Encounters:  10/18/24 238 lb 9.6 oz (108.2 kg)  08/09/24 225 lb 6.4 oz (102.2 kg)  05/20/24 231 lb 6.4 oz (105 kg)    Physical Exam Vitals and nursing note reviewed.  Constitutional:      General: He is not in acute distress.    Appearance: Normal appearance. He is not ill-appearing, toxic-appearing or diaphoretic.  HENT:     Head: Normocephalic and atraumatic.  Eyes:     Pupils: Pupils are equal, round, and reactive to light.  Cardiovascular:     Rate and Rhythm: Normal rate and regular rhythm.     Heart sounds: Normal heart sounds.     Comments: Spider veins / VV to BLE Pulmonary:     Effort: Pulmonary effort is normal.     Breath sounds: Normal breath sounds. No rhonchi or rales.  Musculoskeletal:     Right lower leg: No edema.     Left lower leg: Edema (ankle with slight  discoloration) present.     Comments: Brace on right knee  Skin:    General: Skin is warm and dry.     Capillary Refill: Capillary refill takes less than 2 seconds.  Neurological:     General: No focal deficit present.     Mental Status: He is alert  and oriented to person, place, and time.  Psychiatric:        Mood and Affect: Mood normal.        Behavior: Behavior normal.        Thought Content: Thought content normal.        Judgment: Judgment normal.       Results for orders placed or performed in visit on 08/15/24  Basic Metabolic Panel   Collection Time: 08/15/24 11:02 AM  Result Value Ref Range   Glucose 154 (H) 70 - 99 mg/dL   BUN 17 8 - 27 mg/dL   Creatinine, Ser 8.66 (H) 0.76 - 1.27 mg/dL   eGFR 56 (L) >40 fO/fpw/8.26   BUN/Creatinine Ratio 13 10 - 24   Sodium 133 (L) 134 - 144 mmol/L   Potassium 5.2 3.5 - 5.2 mmol/L   Chloride 100 96 - 106 mmol/L   CO2 20 20 - 29 mmol/L   Calcium  9.3 8.6 - 10.2 mg/dL       Pertinent labs & imaging results that were available during my care of the patient were reviewed by me and considered in my medical decision making.  Assessment & Plan:  Lathon was seen today for edema.  Diagnoses and all orders for this visit:  Left ankle swelling -     BMP8+EGFR -     Brain natriuretic peptide  Varicose veins of ankle       Probable venous insufficiency with varicose veins and lower extremity edema Varicose veins and edema in the left lower extremity. No associated pain or significant pitting edema. Likely due to venous insufficiency as evidenced by varicose veins and hemosiderin staining. No symptoms suggestive of heart failure such as shortness of breath, fatigue, or orthopnea. Compression socks provide minimal relief. Differential includes potential fluid overload or kidney function decline. - Order BNP to assess for fluid overload. - Order kidney function tests to evaluate for decline in kidney function. - Consider venous insufficiency studies if BNP and kidney function tests are normal. - Advise wearing compression socks regularly to manage symptoms. - Discuss potential use of diuretics pending lab results.  Chronic kidney disease, stage 3a Chronic kidney disease, stage 3a,  with previous creatinine level of 1.33 and GFR of 56 as of August. Monitoring for further decline in kidney function is necessary, especially in the context of lower extremity edema. - Order kidney function tests to monitor for any further decline in kidney function.  Knee osteoarthritis Discussion about potential PRP therapy for knee issues as an alternative to steroid injections or gel therapy. PRP therapy involves using platelet-rich plasma to potentially regenerate knee tissue. PRP may be beneficial if there is viable tissue present, otherwise gel therapy may be considered. - Advise discussing PRP therapy and gel therapy options with orthopedic specialist.          Continue all other maintenance medications.  Follow up plan: Return if symptoms worsen or fail to improve.   Continue healthy lifestyle choices, including diet (rich in fruits, vegetables, and lean proteins, and low in salt and simple carbohydrates) and exercise (at least 30 minutes of moderate physical activity daily).  Educational handout given for  venous insufficiency   The above assessment and management plan was discussed with the patient. The patient verbalized understanding of and has agreed to the management plan. Patient is aware to call the clinic if they develop any new symptoms or if symptoms persist or worsen. Patient is aware when to return to the clinic for a follow-up visit. Patient educated on when it is appropriate to go to the emergency department.   Rosaline Bruns, FNP-C [facility] [phone]

## 2024-10-19 ENCOUNTER — Ambulatory Visit

## 2024-10-19 ENCOUNTER — Ambulatory Visit: Payer: Self-pay | Admitting: Family Medicine

## 2024-10-19 VITALS — BP 168/85 | HR 67 | Ht 76.0 in | Wt 238.0 lb

## 2024-10-19 DIAGNOSIS — M25472 Effusion, left ankle: Secondary | ICD-10-CM

## 2024-10-19 DIAGNOSIS — Z0000 Encounter for general adult medical examination without abnormal findings: Secondary | ICD-10-CM | POA: Diagnosis not present

## 2024-10-19 DIAGNOSIS — I8390 Asymptomatic varicose veins of unspecified lower extremity: Secondary | ICD-10-CM

## 2024-10-19 LAB — BMP8+EGFR
BUN/Creatinine Ratio: 10 (ref 10–24)
BUN: 12 mg/dL (ref 8–27)
CO2: 23 mmol/L (ref 20–29)
Calcium: 9.6 mg/dL (ref 8.6–10.2)
Chloride: 102 mmol/L (ref 96–106)
Creatinine, Ser: 1.26 mg/dL (ref 0.76–1.27)
Glucose: 49 mg/dL — ABNORMAL LOW (ref 70–99)
Potassium: 4.2 mmol/L (ref 3.5–5.2)
Sodium: 139 mmol/L (ref 134–144)
eGFR: 60 mL/min/1.73 (ref >59)

## 2024-10-19 LAB — BRAIN NATRIURETIC PEPTIDE: BNP: 92.6 pg/mL (ref 0.0–100.0)

## 2024-10-19 MED ORDER — HYDROCHLOROTHIAZIDE 25 MG PO TABS: 25.0000 mg | ORAL_TABLET | Freq: Every day | ORAL | 3 refills | Status: AC | PRN

## 2024-10-19 NOTE — Patient Instructions (Signed)
 Jeffrey Shaw,  Thank you for taking the time for your Medicare Wellness Visit. I appreciate your continued commitment to your health goals. Please review the care plan we discussed, and feel free to reach out if I can assist you further.  Medicare recommends these wellness visits once per year to help you and your care team stay ahead of potential health issues. These visits are designed to focus on prevention, allowing your provider to concentrate on managing your acute and chronic conditions during your regular appointments.  Please note that Annual Wellness Visits do not include a physical exam. Some assessments may be limited, especially if the visit was conducted virtually. If needed, we may recommend a separate in-person follow-up with your provider.  Ongoing Care Seeing your primary care provider every 3 to 6 months helps us  monitor your health and provide consistent, personalized care.   Referrals If a referral was made during today's visit and you haven't received any updates within two weeks, please contact the referred provider directly to check on the status.  Recommended Screenings:  Health Maintenance  Topic Date Due   Eye exam for diabetics  02/18/2024   Medicare Annual Wellness Visit  06/08/2024   Complete foot exam   08/06/2024   Hemoglobin A1C  08/08/2024   DTaP/Tdap/Td vaccine (3 - Td or Tdap) 02/08/2025*   Flu Shot  03/21/2025*   Yearly kidney health urinalysis for diabetes  02/08/2025   Yearly kidney function blood test for diabetes  10/18/2025   Cologuard (Stool DNA test)  02/15/2026   Pneumococcal Vaccine for age over 82  Completed   Hepatitis C Screening  Completed   Zoster (Shingles) Vaccine  Completed   Meningitis B Vaccine  Aged Out   COVID-19 Vaccine  Discontinued  *Topic was postponed. The date shown is not the original due date.       10/19/2024    9:15 AM  Advanced Directives  Does Patient Have a Medical Advance Directive? Yes  Type of Sports Coach of [place];Living will  Copy of Healthcare Power of Attorney in Chart? Yes - validated most recent copy scanned in chart (See row information)   Advance Care Planning is important because it: Ensures you receive medical care that aligns with your values, goals, and preferences. Provides guidance to your family and loved ones, reducing the emotional burden of decision-making during critical moments.  Vision: Annual vision screenings are recommended for early detection of glaucoma, cataracts, and diabetic retinopathy. These exams can also reveal signs of chronic conditions such as diabetes and high blood pressure.  Dental: Annual dental screenings help detect early signs of oral cancer, gum disease, and other conditions linked to overall health, including heart disease and diabetes.  Please see the attached documents for additional preventive care recommendations.

## 2024-10-19 NOTE — Progress Notes (Signed)
 Subjective:   Jeffrey Shaw is a 73 y.o. who presents for a Medicare Wellness preventive visit.  As a reminder, Annual Wellness Visits don't include a physical exam, and some assessments may be limited, especially if this visit is performed virtually. We may recommend an in-person follow-up visit with your provider if needed.  Visit Complete: Virtual I connected with  Jeffrey Shaw on 10/19/24 by a audio enabled telemedicine application and verified that I am speaking with the correct person using two identifiers.  Patient Location: Home  Provider Location: Office/Clinic  I discussed the limitations of evaluation and management by telemedicine. The patient expressed understanding and agreed to proceed.  Vital Signs: Because this visit was a virtual/telehealth visit, some criteria may be missing or patient reported. Any vitals not documented were not able to be obtained and vitals that have been documented are patient reported.  VideoDeclined- This patient declined Librarian, academic. Therefore the visit was completed with audio only.  Persons Participating in Visit: Patient.  AWV Questionnaire: Yes: Patient Medicare AWV questionnaire was completed by the patient on 10/17/24; I have confirmed that all information answered by patient is correct and no changes since this date.        Objective:    Today's Vitals   10/19/24 0912  BP: (!) 168/85  Pulse: 67  Weight: 238 lb (108 kg)  Height: 6' 4 (1.93 m)   Body mass index is 28.97 kg/m.     10/19/2024    9:15 AM 06/09/2023    1:53 PM 10/30/2022   11:43 AM 10/28/2022    1:02 PM 10/07/2022    2:32 PM 06/04/2022    2:54 PM 04/08/2022   11:07 AM  Advanced Directives  Does Patient Have a Medical Advance Directive? Yes Yes Yes Yes No Yes No  Type of Estate Agent of [place];Living will Healthcare Power of [place];Living will Healthcare Power of [place];Living will Healthcare Power  of [place];Living will  Healthcare Power of [place];Living will   Does patient want to make changes to medical advance directive?  No - Patient declined No - Patient declined No - Patient declined     Copy of Healthcare Power of Attorney in Chart? Yes - validated most recent copy scanned in chart (See row information) Yes - validated most recent copy scanned in chart (See row information) No - copy requested No - copy requested  Yes - validated most recent copy scanned in chart (See row information)   Would patient like information on creating a medical advance directive?    Yes (MAU/Ambulatory/Procedural Areas - Information given) No - Patient declined  No - Patient declined    Current Medications (verified) Outpatient Encounter Medications as of 10/19/2024  Medication Sig   aspirin  EC 81 MG tablet Take 81 mg by mouth daily. Swallow whole.   atorvastatin  (LIPITOR) 40 MG tablet Take 1 tablet by mouth once daily   Continuous Glucose Receiver (DEXCOM G6 RECEIVER) DEVI USE AS DIRECTED   Continuous Glucose Sensor (DEXCOM G6 SENSOR) MISC APPLY A NEW SENSOR TOPICALLY EVERY 10 DAYS   Continuous Glucose Transmitter (DEXCOM G6 TRANSMITTER) MISC USE AS DIRECTED EVERY  3  MONTHS   dutasteride  (AVODART ) 0.5 MG capsule Take 1 capsule (0.5 mg total) by mouth daily.   famotidine  (PEPCID ) 20 MG tablet Take 20 mg by mouth 2 (two) times daily.   fluticasone (FLONASE) 50 MCG/ACT nasal spray Place 1 spray into both nostrils daily.   insulin  aspart (NOVOLOG )  100 UNIT/ML FlexPen Inject 10-18 Units into the skin 3 (three) times daily before meals.   insulin  degludec (TRESIBA  FLEXTOUCH) 100 UNIT/ML FlexTouch Pen Inject 11 Units into the skin daily. Gets via novo nordisk patient assistance   Insulin  Pen Needle 29G X MISC Use to inject insulin  up to 6 times daily as directed. DX E11.65   levothyroxine  (SYNTHROID ) 150 MCG tablet Take 1 tablet (150 mcg total) by mouth daily.   lisinopril  (ZESTRIL ) 5 MG tablet Take 1  tablet (5 mg total) by mouth daily.   MAGNESIUM PO Take 1 tablet by mouth daily.   OZEMPIC , 1 MG/DOSE, 4 MG/3ML SOPN Inject 1 mg into the skin every Sunday.   sildenafil  (VIAGRA ) 100 MG tablet TAKE 1 TABLET BY MOUTH ONCE DAILY AS NEEDED FOR ERECTILE DYSFUNCTION   No facility-administered encounter medications on file as of 10/19/2024.    Allergies (verified) Morphine  and codeine   History: Past Medical History:  Diagnosis Date   Allergy morphine    Arthritis    CAD (coronary artery disease)    CABG 01/2008 /  Catheterization, November, 2012, LIMA to the LAD patent,  70% distal left main stenosis involving the proximal circumflex, DES to the left main continuing into the circumflex   CORONARY ARTERY BYPASS GRAFT, HX OF 01/17/2010   Qualifier: Diagnosis of  By: Micky, MD, Jeffrey Shaw, Jeffrey Shaw    Diabetes mellitus    Dr. Alferd, [facility]   GERD (gastroesophageal reflux disease)    HTN (hypertension)    Hx of CABG    2009, LIMA to LAD, right radial to PDA, SVG to diagonal, SVG to OM1   Hyperlipemia    Hypothyroid    Primary osteoarthritis of left hip 04/18/2016   Past Surgical History:  Procedure Laterality Date   BACK SURGERY     CARPAL TUNNEL RELEASE     left hand   CATARACT EXTRACTION Bilateral    CORONARY ANGIOPLASTY     CORONARY ARTERY BYPASS GRAFT  01/2008   X 6   INGUINAL HERNIA REPAIR     right   INGUINAL HERNIA REPAIR Left 09/13/2021   Procedure: HERNIA REPAIR INGUINAL ADULT W/MESH;  Surgeon: Jeffrey Anes, MD;  Location: [facility];  Service: General;  Laterality: Left;   JOINT REPLACEMENT  hip   LEFT HEART CATHETERIZATION WITH CORONARY/GRAFT ANGIOGRAM N/A 10/30/2011   Procedure: LEFT HEART CATHETERIZATION WITH EL BILE;  Surgeon: Jeffrey JONETTA Como, MD;  Location: [facility] CATH LAB;  Service: Cardiovascular;  Laterality: N/A;   LUMBAR DISC SURGERY     removed   PERCUTANEOUS CORONARY STENT INTERVENTION (PCI-S) N/A 11/03/2011   Procedure: PERCUTANEOUS CORONARY  STENT INTERVENTION (PCI-S);  Surgeon: Jeffrey JONETTA Cash, MD;  Location: [facility] CATH LAB;  Service: Cardiovascular;  Laterality: N/A;   TONSILLECTOMY  ~ 1955   TOTAL HIP ARTHROPLASTY Left 04/18/2016   Procedure: TOTAL HIP ARTHROPLASTY;  Surgeon: Toribio Silos, MD;  Location: [facility] OR;  Service: Orthopedics;  Laterality: Left;   UMBILICAL HERNIA REPAIR N/A 09/13/2021   Procedure: HERNIA REPAIR UMBILICAL ADULT W/MESH;  Surgeon: Jeffrey Anes, MD;  Location: [facility];  Service: General;  Laterality: N/A;   URETEROLITHOTOMY Left    with stone basket removal   XI ROBOTIC ASSISTED INGUINAL HERNIA REPAIR WITH MESH Left 10/30/2022   Procedure: XI ROBOTIC ASSISTED INGUINAL HERNIA REPAIR WITH MESH;  Surgeon: Jeffrey Anes, MD;  Location: [facility];  Service: General;  Laterality: Left;   Family History  Problem Relation Age of Onset   Dementia Mother  Liver disease Mother    Suicidality Father    Emphysema Father    Cancer Father    Drug abuse Brother        Clean now   Rheum arthritis Sister    Heart attack Maternal Grandmother    Emphysema Maternal Grandfather    Stroke Maternal Grandfather    Stomach cancer Paternal Grandmother    Diabetes Paternal Aunt    Diabetes Paternal Aunt    Social History   Socioeconomic History   Marital status: Married    Spouse name: SANDRA   Number of children: 0   Years of education: Not on file   Highest education level: 12th grade  Occupational History   Occupation: SELF EMPLOYED    Comment: retired   Occupation: Marine Scientist: [employer]    Comment: 5 hours/ 5 days per week  Tobacco Use   Smoking status: Former    Current packs/day: 0.00    Average packs/day: 1 pack/day for 4.0 years (4.0 ttl pk-yrs)    Types: Cigarettes    Start date: 12/22/1964    Quit date: 12/22/1968    Years since quitting: 55.8   Smokeless tobacco: Never  Vaping Use   Vaping status: Never Used  Substance and Sexual Activity   Alcohol use: Not Currently    Comment: maybe one  beer a month   Drug use: No   Sexual activity: Yes    Birth control/protection: None  Other Topics Concern   Not on file  Social History Narrative   Lives in one level home with his wife.   Very active - works at [employer], plays basketball, walks daily   Social Drivers of Corporate Investment Banker Strain: Low Risk  (10/19/2024)   Overall Financial Resource Strain (CARDIA)    Difficulty of Paying Living Expenses: Not very hard  Recent Concern: Financial Resource Strain - Medium Risk (08/02/2024)   Overall Financial Resource Strain (CARDIA)    Difficulty of Paying Living Expenses: Somewhat hard  Food Insecurity: No Food Insecurity (10/19/2024)   Hunger Vital Sign    Worried About Running Out of Food in the Last Year: Never true    Ran Out of Food in the Last Year: Never true  Transportation Needs: No Transportation Needs (10/17/2024)   PRAPARE - Administrator, Civil Service (Medical): No    Lack of Transportation (Non-Medical): No  Physical Activity: Sufficiently Active (10/19/2024)   Exercise Vital Sign    Days of Exercise per Week: 4 days    Minutes of Exercise per Session: 60 min  Stress: No Stress Concern Present (10/19/2024)   [employer] of Occupational Health - Occupational Stress Questionnaire    Feeling of Stress: Not at all  Social Connections: Socially Integrated (10/19/2024)   Social Connection and Isolation Panel    Frequency of Communication with Friends and Family: Twice a week    Frequency of Social Gatherings with Friends and Family: Twice a week    Attends Religious Services: More than 4 times per year    Active Member of [employer] or Organizations: Yes    Attends Engineer, Structural: More than 4 times per year    Marital Status: Married    Tobacco Counseling Counseling given: Yes    Clinical Intake:  Pre-visit preparation completed: Yes  Pain : No/denies pain     BMI - recorded: 28.97 Nutritional Status:  BMI 25 -29 Overweight Nutritional Risks: None Diabetes: Yes  Lab Results  Component Value Date   HGBA1C 6.5 (H) 02/09/2024   HGBA1C 6.4 (H) 10/07/2022   HGBA1C 6.5 (H) 08/06/2022     How often do you need to have someone help you when you read instructions, pamphlets, or other written materials from your doctor or pharmacy?: 1 - Never  Interpreter Needed?: No  Information entered by :: alia t/cma   Activities of Daily Living     10/17/2024   12:01 PM  In your present state of health, do you have any difficulty performing the following activities:  Hearing? 0  Vision? 0  Difficulty concentrating or making decisions? 0  Walking or climbing stairs? 0  Dressing or bathing? 0  Doing errands, shopping? 0  Preparing Food and eating ? N  Using the Toilet? N  In the past six months, have you accidently leaked urine? N  Do you have problems with loss of bowel control? N  Managing your Medications? N  Managing your Finances? N  Housekeeping or managing your Housekeeping? N    Patient Care Team: Severa Rock HERO, FNP as PCP - General (Family Medicine) Ladora Ross Lacy Phebe, MD as Referring Physician (Optometry) Billee, Mliss BIRCH, [facility] as Pharmacist (Family Medicine) Tommas Pears, MD as Referring Physician (Endocrinology) Sherrilee Belvie CROME, MD as Consulting Physician (Urology)  I have updated your Care Teams any recent Medical Services you may have received from other providers in the past year.     Assessment:   This is a routine wellness examination for [place].  Hearing/Vision screen Hearing Screening - Comments:: Pt denies hearing dif Vision Screening - Comments:: Pt wear glasses/pt [facility] in [place]/last 01/2024   Goals Addressed             This Visit's Progress    Activity and Exercise Increased   On track    Evidence-based guidance:  Review current exercise levels.  Assess patient perspective on exercise or activity level, barriers to  increasing activity, motivation and readiness for change.  Recommend or set healthy exercise goal based on individual tolerance.  Encourage small steps toward making change in amount of exercise or activity.  Urge reduction of sedentary activities or screen time.  Promote group activities within the community or with family or support person.  Consider referral to rehabiliation therapist for assessment and exercise/activity plan.   Notes:        Depression Screen     10/19/2024    9:18 AM 10/18/2024   10:55 AM 08/09/2024   10:45 AM 05/20/2024   10:45 AM 02/09/2024    8:13 AM 08/07/2023    8:09 AM 06/09/2023    1:52 PM  PHQ 2/9 Scores  PHQ - 2 Score 0 0 0 0 0 0 0  PHQ- 9 Score 0 0 0 0 0 0     Fall Risk     10/17/2024   12:01 PM 08/09/2024   10:45 AM 05/20/2024   10:45 AM 02/09/2024    8:12 AM 08/07/2023    8:09 AM  Fall Risk   Falls in the past year? 0 1 0 0 0  Number falls in past yr: 0 0 0    Injury with Fall? 0 1 0    Risk for fall due to : No Fall Risks History of fall(s) No Fall Risks No Fall Risks   Follow up Falls evaluation completed;Education provided Falls evaluation completed Falls evaluation completed Falls evaluation completed     MEDICARE RISK  AT HOME:  Medicare Risk at Home Any stairs in or around the home?: (Patient-Rptd) Yes If so, are there any without handrails?: (Patient-Rptd) No Home free of loose throw rugs in walkways, pet beds, electrical cords, etc?: (Patient-Rptd) No Adequate lighting in your home to reduce risk of falls?: (Patient-Rptd) Yes Life alert?: (Patient-Rptd) No Use of a cane, walker or w/c?: (Patient-Rptd) No Grab bars in the bathroom?: (Patient-Rptd) Yes Shower chair or bench in shower?: (Patient-Rptd) No Elevated toilet seat or a handicapped toilet?: (Patient-Rptd) No  TIMED UP AND GO:  Was the test performed?  no  Cognitive Function: 6CIT completed        10/19/2024    9:20 AM 06/09/2023    1:53 PM 06/04/2022    2:40 PM  05/28/2021   10:34 AM 04/04/2020    9:48 AM  6CIT Screen  What Year? 0 points 0 points 0 points 0 points 0 points  What month? 0 points 0 points 0 points 0 points 0 points  What time? 0 points 0 points 0 points 0 points 0 points  Count back from 20 0 points 0 points 0 points 0 points 0 points  Months in reverse 0 points 0 points 0 points 0 points 0 points  Repeat phrase 0 points 0 points 0 points 0 points 0 points  Total Score 0 points 0 points 0 points 0 points 0 points    Immunizations Immunization History  Administered Date(s) Administered   Influenza Split 10/07/2017   Influenza,inj,quad, With Preservative 09/24/2018, 10/10/2019   Influenza-Unspecified 10/13/2008, 10/06/2014, 10/05/2015, 09/24/2016, 09/24/2018, 10/05/2020   Pneumococcal Conjugate-13 10/06/2014   Pneumococcal Polysaccharide-23 10/09/1997, 01/07/2019   Td 09/02/2004   Tdap 03/14/2013   Zoster Recombinant(Shingrix) 09/28/2020, 03/07/2021   Zoster, Live 07/29/2013    Screening Tests Health Maintenance  Topic Date Due   OPHTHALMOLOGY EXAM  02/18/2024   FOOT EXAM  08/06/2024   HEMOGLOBIN A1C  08/08/2024   DTaP/Tdap/Td (3 - Td or Tdap) 02/08/2025 (Originally 03/15/2023)   Influenza Vaccine  03/21/2025 (Originally 07/22/2024)   Diabetic kidney evaluation - Urine ACR  02/08/2025   Diabetic kidney evaluation - eGFR measurement  10/18/2025   Medicare Annual Wellness (AWV)  10/19/2025   Fecal DNA (Cologuard)  02/15/2026   Pneumococcal Vaccine: 50+ Years  Completed   Hepatitis C Screening  Completed   Zoster Vaccines- Shingrix  Completed   Meningococcal B Vaccine  Aged Out   COVID-19 Vaccine  Discontinued    Health Maintenance Items Addressed: See Nurse Notes at the end of this note  Additional Screening:  Vision Screening: Recommended annual ophthalmology exams for early detection of glaucoma and other disorders of the eye. Is the patient up to date with their annual eye exam?  Yes  Who is the provider or what  is the name of the office in which the patient attends annual eye exams? Dr. [facility] in [place], [place]  Dental Screening: Recommended annual dental exams for proper oral hygiene  Community Resource Referral / Chronic Care Management: CRR required this visit?  Yes   CCM required this visit?  No   Plan:    I have personally reviewed and noted the following in the patient's chart:   Medical and social history Use of alcohol, tobacco or illicit drugs  Current medications and supplements including opioid prescriptions. Patient is not currently taking opioid prescriptions. Functional ability and status Nutritional status Physical activity Advanced directives List of other physicians Hospitalizations, surgeries, and ER visits  in previous 12 months Vitals Screenings to include cognitive, depression, and falls Referrals and appointments  In addition, I have reviewed and discussed with patient certain preventive protocols, quality metrics, and best practice recommendations. A written personalized care plan for preventive services as well as general preventive health recommendations were provided to patient.   Ozie Ned, CMA   10/19/2024   After Visit Summary: (MyChart) Due to this being a telephonic visit, the after visit summary with patients personalized plan was offered to patient via MyChart   Notes: PCP Follow Up Recommendations: pt is aware and due the following: foot exam, A1c, diabetic Eye exam--per pt had already had it done 3/25, no records in [facility]. Will f/u with provider for report

## 2024-10-20 ENCOUNTER — Telehealth: Payer: Self-pay

## 2024-10-20 NOTE — Telephone Encounter (Signed)
Faxed refills to novo nordisk.

## 2024-10-20 NOTE — Telephone Encounter (Signed)
 In process of completing Novo Nordisk refills for patients OZEMPIC  1MG  DOSE PENS medication.  Application given to Julie P for completion.  Other meds processed for shipping 09/07/24.

## 2024-10-21 DIAGNOSIS — M170 Bilateral primary osteoarthritis of knee: Secondary | ICD-10-CM | POA: Diagnosis not present

## 2024-10-23 ENCOUNTER — Other Ambulatory Visit: Payer: Self-pay | Admitting: *Deleted

## 2024-10-23 DIAGNOSIS — E1169 Type 2 diabetes mellitus with other specified complication: Secondary | ICD-10-CM

## 2024-10-23 DIAGNOSIS — E1159 Type 2 diabetes mellitus with other circulatory complications: Secondary | ICD-10-CM

## 2024-10-23 DIAGNOSIS — E1165 Type 2 diabetes mellitus with hyperglycemia: Secondary | ICD-10-CM

## 2024-10-23 DIAGNOSIS — I700 Atherosclerosis of aorta: Secondary | ICD-10-CM

## 2024-10-23 DIAGNOSIS — I152 Hypertension secondary to endocrine disorders: Secondary | ICD-10-CM

## 2024-10-23 DIAGNOSIS — I2510 Atherosclerotic heart disease of native coronary artery without angina pectoris: Secondary | ICD-10-CM

## 2024-10-27 ENCOUNTER — Telehealth: Payer: Self-pay | Admitting: Pharmacist

## 2024-10-27 ENCOUNTER — Encounter: Payer: Self-pay | Admitting: Family Medicine

## 2024-10-27 DIAGNOSIS — E1165 Type 2 diabetes mellitus with hyperglycemia: Secondary | ICD-10-CM

## 2024-10-27 MED ORDER — DEXCOM G7 SENSOR MISC: 5 refills | Status: DC

## 2024-10-27 MED ORDER — DEXCOM G7 RECEIVER DEVI: 1 refills | Status: AC

## 2024-10-27 NOTE — Telephone Encounter (Signed)
 Patient interested in switching to dexcom G7 CGM Receiver and Sensors called in the [facility] My chart message sent to patient   Jeffrey Shaw, PharmD, BCACP, CPP Clinical Pharmacist, [facility]

## 2024-11-02 ENCOUNTER — Ambulatory Visit: Attending: Family Medicine

## 2024-11-02 ENCOUNTER — Ambulatory Visit: Payer: Self-pay | Admitting: Family Medicine

## 2024-11-02 DIAGNOSIS — I8390 Asymptomatic varicose veins of unspecified lower extremity: Secondary | ICD-10-CM

## 2024-11-02 DIAGNOSIS — M25472 Effusion, left ankle: Secondary | ICD-10-CM

## 2024-11-02 DIAGNOSIS — R600 Localized edema: Secondary | ICD-10-CM | POA: Diagnosis not present

## 2024-11-02 DIAGNOSIS — I872 Venous insufficiency (chronic) (peripheral): Secondary | ICD-10-CM

## 2024-11-03 ENCOUNTER — Encounter: Payer: Self-pay | Admitting: Family Medicine

## 2024-11-10 ENCOUNTER — Encounter: Payer: Self-pay | Admitting: Family Medicine

## 2024-11-10 DIAGNOSIS — E1165 Type 2 diabetes mellitus with hyperglycemia: Secondary | ICD-10-CM

## 2024-11-10 MED ORDER — DEXCOM G7 SENSOR MISC: 5 refills | Status: AC

## 2024-12-23 ENCOUNTER — Other Ambulatory Visit: Payer: Self-pay | Admitting: Urology

## 2025-01-02 ENCOUNTER — Other Ambulatory Visit: Payer: Medicare Other

## 2025-01-02 DIAGNOSIS — R972 Elevated prostate specific antigen [PSA]: Secondary | ICD-10-CM

## 2025-01-03 ENCOUNTER — Ambulatory Visit: Attending: Vascular Surgery | Admitting: Physician Assistant

## 2025-01-03 VITALS — BP 160/75 | HR 78 | Temp 98.1°F | Wt 239.2 lb

## 2025-01-03 DIAGNOSIS — M7989 Other specified soft tissue disorders: Secondary | ICD-10-CM

## 2025-01-03 LAB — PSA, TOTAL AND FREE
PSA, Free Pct: 26.3 %
PSA, Free: 0.79 ng/mL
Prostate Specific Ag, Serum: 3 ng/mL (ref 0.0–4.0)

## 2025-01-03 NOTE — Progress Notes (Signed)
 "        [facility]           [facility] [place]  History and Physical   Jeffrey Shaw is a 74 y.o. male who presents with bilateral leg swelling.    He states he does not have any hx of DVT.  He has never had any vein procedures on his legs other than vein harvest on the right for CABG 17 years ago.  He does not have any skin color changes.  He does not have any family hx of venous disorders.  He does wear knee high compression when he works in the yard but not otherwise.  He wears some compression socks that are just above his ankles otherwise.  He does work at [employer].  He walks regularly.  He does not have any skin color changes in his legs.   He is followed by ortho for bilateral knee pain and has received steroid injections.    The pt is on a statin for cholesterol management.  The pt is on a daily aspirin .   Other AC:  none The pt is on diuretic for hypertension.   The pt is  on medication for diabetes.   Tobacco hx:  former  Pt does have family hx of AAA.  He states his sister had open repair for AAA.  She was a smoker.    Past Medical History:  Diagnosis Date   Allergy morphine    Arthritis    CAD (coronary artery disease)    CABG 01/2008 /  Catheterization, November, 2012, LIMA to the LAD patent,  70% distal left main stenosis involving the proximal circumflex, DES to the left main continuing into the circumflex   CORONARY ARTERY BYPASS GRAFT, HX OF 01/17/2010   Qualifier: Diagnosis of  By: Micky, MD, [facility], [place]    Diabetes mellitus    Dr. Alferd, [facility]   GERD (gastroesophageal reflux disease)    HTN (hypertension)    Hx of CABG    2009, LIMA to LAD, right radial to PDA, SVG to diagonal, SVG to OM1   Hyperlipemia    Hypothyroid    Primary osteoarthritis of left hip 04/18/2016    Past Surgical History:  Procedure Laterality Date   BACK SURGERY     CARPAL TUNNEL RELEASE     left hand   CATARACT EXTRACTION Bilateral    CORONARY ANGIOPLASTY      CORONARY ARTERY BYPASS GRAFT  01/2008   X 6   INGUINAL HERNIA REPAIR     right   INGUINAL HERNIA REPAIR Left 09/13/2021   Procedure: HERNIA REPAIR INGUINAL ADULT W/MESH;  Surgeon: Mavis Anes, MD;  Location: [facility];  Service: General;  Laterality: Left;   JOINT REPLACEMENT  hip   LEFT HEART CATHETERIZATION WITH CORONARY/GRAFT ANGIOGRAM N/A 10/30/2011   Procedure: LEFT HEART CATHETERIZATION WITH EL BILE;  Surgeon: Debby JONETTA Como, MD;  Location: [facility] CATH LAB;  Service: Cardiovascular;  Laterality: N/A;   LUMBAR DISC SURGERY     removed   PERCUTANEOUS CORONARY STENT INTERVENTION (PCI-S) N/A 11/03/2011   Procedure: PERCUTANEOUS CORONARY STENT INTERVENTION (PCI-S);  Surgeon: Lonni JONETTA Cash, MD;  Location: [facility] CATH LAB;  Service: Cardiovascular;  Laterality: N/A;   TONSILLECTOMY  ~ 1955   TOTAL HIP ARTHROPLASTY Left 04/18/2016   Procedure: TOTAL HIP ARTHROPLASTY;  Surgeon: Toribio Silos, MD;  Location: [facility];  Service: Orthopedics;  Laterality: Left;   UMBILICAL HERNIA REPAIR N/A 09/13/2021  Procedure: HERNIA REPAIR UMBILICAL ADULT W/MESH;  Surgeon: Mavis Anes, MD;  Location: [facility];  Service: General;  Laterality: N/A;   URETEROLITHOTOMY Left    with stone basket removal   XI ROBOTIC ASSISTED INGUINAL HERNIA REPAIR WITH MESH Left 10/30/2022   Procedure: XI ROBOTIC ASSISTED INGUINAL HERNIA REPAIR WITH MESH;  Surgeon: Mavis Anes, MD;  Location: [facility];  Service: General;  Laterality: Left;    Social History   Socioeconomic History   Marital status: Married    Spouse name: SANDRA   Number of children: 0   Years of education: Not on file   Highest education level: 12th grade  Occupational History   Occupation: SELF EMPLOYED    Comment: retired   Occupation: Marine Scientist: [employer]    Comment: 5 hours/ 5 days per week  Tobacco Use   Smoking status: Former    Current packs/day: 0.00    Average packs/day: 1 pack/day for 4.0 years (4.0 ttl pk-yrs)     Types: Cigarettes    Start date: 12/22/1964    Quit date: 12/22/1968    Years since quitting: 56.0   Smokeless tobacco: Never  Vaping Use   Vaping status: Never Used  Substance and Sexual Activity   Alcohol use: Not Currently    Comment: maybe one beer a month   Drug use: No   Sexual activity: Yes    Birth control/protection: None  Other Topics Concern   Not on file  Social History Narrative   Lives in one level home with his wife.   Very active - works at [employer], plays basketball, walks daily   Social Drivers of Health   Tobacco Use: Medium Risk (10/21/2024)   Received from [facility]   Patient History    Smoking Tobacco Use: Former    Smokeless Tobacco Use: Never    Passive Exposure: Not on file  Financial Resource Strain: Low Risk (10/19/2024)   Overall Financial Resource Strain (CARDIA)    Difficulty of Paying Living Expenses: Not very hard  Recent Concern: Financial Resource Strain - Medium Risk (08/02/2024)   Overall Financial Resource Strain (CARDIA)    Difficulty of Paying Living Expenses: Somewhat hard  Food Insecurity: No Food Insecurity (10/19/2024)   Epic    Worried About Programme Researcher, Broadcasting/film/video in the Last Year: Never true    Ran Out of Food in the Last Year: Never true  Transportation Needs: No Transportation Needs (10/17/2024)   Epic    Lack of Transportation (Medical): No    Lack of Transportation (Non-Medical): No  Physical Activity: Sufficiently Active (10/19/2024)   Exercise Vital Sign    Days of Exercise per Week: 4 days    Minutes of Exercise per Session: 60 min  Stress: No Stress Concern Present (10/19/2024)   [employer] of Occupational Health - Occupational Stress Questionnaire    Feeling of Stress: Not at all  Social Connections: Socially Integrated (10/19/2024)   Social Connection and Isolation Panel    Frequency of Communication with Friends and Family: Twice a week    Frequency of Social Gatherings with Friends and  Family: Twice a week    Attends Religious Services: More than 4 times per year    Active Member of [employer] or Organizations: Yes    Attends Banker Meetings: More than 4 times per year    Marital Status: Married  Catering Manager Violence: Not At Risk (10/19/2024)   Epic  Fear of Current or Ex-Partner: No    Emotionally Abused: No    Physically Abused: No    Sexually Abused: No  Depression (PHQ2-9): Low Risk (10/19/2024)   Depression (PHQ2-9)    PHQ-2 Score: 0  Alcohol Screen: Low Risk (10/17/2024)   Alcohol Screen    Last Alcohol Screening Score (AUDIT): 1  Housing: Unknown (10/19/2024)   Epic    Unable to Pay for Housing in the Last Year: No    Number of Times Moved in the Last Year: Not on file    Homeless in the Last Year: No  Utilities: Not At Risk (10/19/2024)   Epic    Threatened with loss of utilities: No  Health Literacy: Adequate Health Literacy (10/19/2024)   B1300 Health Literacy    Frequency of need for help with medical instructions: Never     Family History  Problem Relation Age of Onset   Dementia Mother    Liver disease Mother    Suicidality Father    Emphysema Father    Cancer Father    Drug abuse Brother        Clean now   Rheum arthritis Sister    Heart attack Maternal Grandmother    Emphysema Maternal Grandfather    Stroke Maternal Grandfather    Stomach cancer Paternal Grandmother    Diabetes Paternal Aunt    Diabetes Paternal Aunt     Current Outpatient Medications  Medication Sig Dispense Refill   aspirin  EC 81 MG tablet Take 81 mg by mouth daily. Swallow whole.     atorvastatin  (LIPITOR) 40 MG tablet Take 1 tablet by mouth once daily 90 tablet 0   Continuous Glucose Receiver (DEXCOM G7 RECEIVER) DEVI Use 1 receiver as directed with Dexcom G7 sensor to monitor glucose continuously. DX:E11.9 1 each 1   Continuous Glucose Sensor (DEXCOM G7 SENSOR) MISC Use 1 sensor as directed every 10 days to monitor glucose continuously.  DX:E11.9 3 each 5   Continuous Glucose Transmitter (DEXCOM G6 TRANSMITTER) MISC USE AS DIRECTED EVERY  3  MONTHS 1 each 0   dutasteride  (AVODART ) 0.5 MG capsule Take 1 capsule by mouth once daily 90 capsule 0   famotidine  (PEPCID ) 20 MG tablet Take 20 mg by mouth 2 (two) times daily.     fluticasone (FLONASE) 50 MCG/ACT nasal spray Place 1 spray into both nostrils daily.     hydrochlorothiazide  (HYDRODIURIL ) 25 MG tablet Take 1 tablet (25 mg total) by mouth daily as needed (lower extermity swelling). 90 tablet 3   insulin  aspart (NOVOLOG ) 100 UNIT/ML FlexPen Inject 10-18 Units into the skin 3 (three) times daily before meals.     insulin  degludec (TRESIBA  FLEXTOUCH) 100 UNIT/ML FlexTouch Pen Inject 11 Units into the skin daily. Gets via novo nordisk patient assistance     Insulin  Pen Needle 29G X MISC Use to inject insulin  up to 6 times daily as directed. DX E11.65 300 each 11   levothyroxine  (SYNTHROID ) 150 MCG tablet Take 1 tablet (150 mcg total) by mouth daily. 90 tablet 3   lisinopril  (ZESTRIL ) 5 MG tablet Take 1 tablet (5 mg total) by mouth daily. 90 tablet 3   MAGNESIUM PO Take 1 tablet by mouth daily.     OZEMPIC , 1 MG/DOSE, 4 MG/3ML SOPN Inject 1 mg into the skin every Sunday. 3 mL 0   sildenafil  (VIAGRA ) 100 MG tablet TAKE 1 TABLET BY MOUTH ONCE DAILY AS NEEDED FOR ERECTILE DYSFUNCTION 30 tablet 3   No current  facility-administered medications for this visit.    Allergies[1]  REVIEW OF SYSTEMS:   [X]  denotes positive finding, [ ]  denotes negative finding Cardiac  Comments:  Chest pain or chest pressure:    Shortness of breath upon exertion:    Short of breath when lying flat:    Irregular heart rhythm:        Vascular    Pain in calf, thigh, or hip brought on by ambulation:    Pain in feet at night that wakes you up from your sleep:     Blood clot in your veins:    Leg swelling:  x       Pulmonary    Oxygen at home:    Productive cough:     Wheezing:          Neurologic    Sudden weakness in arms or legs:     Sudden numbness in arms or legs:     Sudden onset of difficulty speaking or slurred speech:    Temporary loss of vision in one eye:     Problems with dizziness:         Gastrointestinal    Blood in stool:     Vomited blood:         Genitourinary    Burning when urinating:     Blood in urine:        Psychiatric    Major depression:         Hematologic    Bleeding problems:    Problems with blood clotting too easily:        Skin    Rashes or ulcers:        Constitutional    Fever or chills:      PHYSICAL EXAMINATION:  Today's Vitals   01/03/25 0927  BP: (!) 160/75  Pulse: 78  Temp: 98.1 F (36.7 C)  TempSrc: Temporal  Weight: 239 lb 3.2 oz (108.5 kg)  PainSc: 0-No pain   Body mass index is 29.12 kg/m.   General:  WDWN in NAD; vital signs documented above Gait: Not observed HENT: WNL, normocephalic Pulmonary: normal non-labored breathing without wheezing Cardiac: regular HR; without carotid bruits Abdomen: soft, NT, aortic pulse is not palpable Skin: without rashes Vascular Exam/Pulses:  Right Left  Radial 2+ (normal) 2+ (normal)  DP 2+ (normal) 2+ (normal)   Extremities: +BLE mild swelling.  Some pitting where his socks come to just above his ankles.   Neurologic: A&O X 3;  moving all extremities equally Psychiatric:  The pt has Normal affect.   Non-Invasive Vascular Imaging:   Venous duplex on 11/02/2024: Venous Reflux Times  +--------------+---------+------+-----------+------------+-----------------  ----+  RIGHT        Reflux NoRefluxReflux TimeDiameter cmsComments                            Yes                                           +--------------+---------+------+-----------+------------+-----------------   CFV                    yes   >1 second                               +--------------+---------+------+-----------+------------+-----------------  FV prox       no                                                       +--------------+---------+------+-----------+------------+-----------------  FV mid        no                                                     +--------------+---------+------+-----------+------------+-----------------  FV dist       no                                                      +--------------+---------+------+-----------+------------+-----------------  Popliteal              yes   >1 second                               +--------------+---------+------+-----------+------------+-----------------  GSV at [facility]    no                            0.59                      +--------------+---------+------+-----------+------------+-----------------  GSV prox thigh                                       GSV not identified/prior CABG  +--------------+---------+------+-----------+------------+-----------------  SSV prox calf                               0.30                      +--------------+---------+------+-----------+------------+-----------------  SSV mid calf                                0.23                      +--------------+---------+------+-----------+------------+-----------------                no                            0.43                      +--------------+---------+------+-----------+------------+-----------------                         yes    >500 ms      0.38                           +--------------+---------+------+-----------+------------+-----------------  no                            0.27                      +--------------+---------+------+-----------+------------+-----------------    +--------------+---------+------+-----------+------------+--------+  LEFT         Reflux NoRefluxReflux TimeDiameter cmsComments                          Yes                                    +--------------+---------+------+-----------+------------+--------+  CFV                    yes   >1 second                       +--------------+---------+------+-----------+------------+--------+  FV prox       no                                              +--------------+---------+------+-----------+------------+--------+  FV mid        no                                              +--------------+---------+------+-----------+------------+--------+  FV dist       no                                              +--------------+---------+------+-----------+------------+--------+  Popliteal              yes   >1 second                       +--------------+---------+------+-----------+------------+--------+  GSV at [facility]    no                            0.63              +--------------+---------+------+-----------+------------+--------+  GSV prox thighno                            0.45              +--------------+---------+------+-----------+------------+--------+  GSV mid thigh no                            0.31              +--------------+---------+------+-----------+------------+--------+  GSV dist thighno                            0.29              +--------------+---------+------+-----------+------------+--------+  GSV at knee   no  0.28              +--------------+---------+------+-----------+------------+--------+  GSV prox calf no                            0.45              +--------------+---------+------+-----------+------------+--------+  GSV mid calf  no                            0.31              +--------------+---------+------+-----------+------------+--------+  GSV dist calf no                            0.29              +--------------+---------+------+-----------+------------+--------+  SSV prox calf           yes                 0.34               +--------------+---------+------+-----------+------------+--------+  SSV mid calf  no                            0.33              +--------------+---------+------+-----------+------------+--------+                         yes    >500 ms      0.72              +--------------+---------+------+-----------+------------+--------+   Summary:  Right:  - No evidence of deep vein thrombosis seen in the right lower extremity, from the common femoral through the popliteal veins.  - No evidence of superficial venous thrombosis in the right lower extremity.  - The greater saphenous vein was not visualized.  - Venous reflux is noted in the right common femoral vein.  - Venous reflux is noted in the right popliteal vein   Left:  - No evidence of deep vein thrombosis seen in the left lower extremity,  from the common femoral through the popliteal veins.  - No evidence of superficial venous thrombosis in the left lower extremity.  - Venous reflux is noted in the left common femoral vein.  - Venous reflux is noted in the left popliteal vein.  - Venous reflux is noted in the left short saphenous vein.    Jeffrey Shaw is a 74 y.o. male who presents with: BLE swelling.     -pt has easily palpable bilateral DP pedal pulses -in the right lower extremity, the pt does not have evidence of DVT.  Pt does have venous reflux in the deep venous system in the CFV and the popliteal vein.  He has some mild reflux in the SSV.  His GSV was harvested for CABG.  In the left lower extremity, there is no evidence of DVT.  Pt does have venous reflux in the deep venous system in the CFV and the popliteal vein.  He does not have reflux in the GSV.  He does have reflux in the SSV.  He is not a candidate for laser ablation.   -discussed with pt about wearing knee high 15-20 mmHg compression stockings and pt was measured for these today.    -  discussed the importance of leg elevation and how to elevate  properly - pt is advised to elevate their legs and a diagram is given to them to demonstrate for pt to lay flat on their back with knees elevated and slightly bent with their feet higher than their knees, which puts their feet higher than their heart for 15 minutes per day.  If pt cannot lay flat, advised to lay as flat as possible.  -pt is advised to continue as much walking as possible and avoid sitting or standing for long periods of time.  -discussed importance of maintaining a healthy weight and exercise and that water  aerobics would also be beneficial.  -handout with recommendations given -fortunately, he is doing most of the above.  He will try the knee high compression. -pt will f/u as needed  Family hx AAA -his sister had open repair of AAA.  Pt had a CT scan in 2022 that revealed no evidence of abdominal aortic aneurysm.   Lucie Apt, [facility] [phone]  Clinic MD:  Gretta     [1]  Allergies Allergen Reactions   Morphine  And Codeine Nausea Only   "

## 2025-01-10 ENCOUNTER — Ambulatory Visit: Payer: Self-pay | Admitting: Urology

## 2025-01-11 ENCOUNTER — Ambulatory Visit: Payer: Medicare Other | Admitting: Urology

## 2025-01-11 ENCOUNTER — Encounter: Payer: Self-pay | Admitting: Urology

## 2025-01-11 VITALS — BP 136/67 | HR 64

## 2025-01-11 DIAGNOSIS — R351 Nocturia: Secondary | ICD-10-CM | POA: Diagnosis not present

## 2025-01-11 DIAGNOSIS — R972 Elevated prostate specific antigen [PSA]: Secondary | ICD-10-CM | POA: Diagnosis not present

## 2025-01-11 DIAGNOSIS — N529 Male erectile dysfunction, unspecified: Secondary | ICD-10-CM

## 2025-01-11 DIAGNOSIS — N5201 Erectile dysfunction due to arterial insufficiency: Secondary | ICD-10-CM

## 2025-01-11 MED ORDER — TADALAFIL 20 MG PO TABS: 20.0000 mg | ORAL_TABLET | ORAL | 5 refills | Status: AC | PRN

## 2025-01-11 MED ORDER — DUTASTERIDE 0.5 MG PO CAPS: 0.5000 mg | ORAL_CAPSULE | Freq: Every day | ORAL | 3 refills | Status: AC

## 2025-01-11 NOTE — Patient Instructions (Signed)

## 2025-01-11 NOTE — Progress Notes (Signed)
 "  01/11/2025 12:00 PM   Jeffrey Shaw 1951/03/10 [id number]  Referring provider: Severa Rock HERO, FNP [address] [place],  [place] [address]  Followup elevated PSA   HPI: Mr Jeffrey Shaw is a 73yo here for followup for elevated PSa and nocturia. PSA decreased to 3.0 from 4.2 on dutasteride . . IPSS 6 QOL 2 on dutasteride  0.5mg  daily. Nocturia 1x. Urine stream is occasionally weak. He takes sildenafil  100mg  which gives him and erection but he cannot maintain the erection.    PMH: Past Medical History:  Diagnosis Date   Allergy morphine    Arthritis    CAD (coronary artery disease)    CABG 01/2008 /  Catheterization, November, 2012, LIMA to the LAD patent,  70% distal left main stenosis involving the proximal circumflex, DES to the left main continuing into the circumflex   CORONARY ARTERY BYPASS GRAFT, HX OF 01/17/2010   Qualifier: Diagnosis of  By: Micky, MD, [facility], [place]    Diabetes mellitus    Dr. Alferd, [facility]   GERD (gastroesophageal reflux disease)    HTN (hypertension)    Hx of CABG    2009, LIMA to LAD, right radial to PDA, SVG to diagonal, SVG to OM1   Hyperlipemia    Hypothyroid    Primary osteoarthritis of left hip 04/18/2016    Surgical History: Past Surgical History:  Procedure Laterality Date   BACK SURGERY     CARPAL TUNNEL RELEASE     left hand   CATARACT EXTRACTION Bilateral    CORONARY ANGIOPLASTY     CORONARY ARTERY BYPASS GRAFT  01/2008   X 6   INGUINAL HERNIA REPAIR     right   INGUINAL HERNIA REPAIR Left 09/13/2021   Procedure: HERNIA REPAIR INGUINAL ADULT W/MESH;  Surgeon: Mavis Anes, MD;  Location: [facility];  Service: General;  Laterality: Left;   JOINT REPLACEMENT  hip   LEFT HEART CATHETERIZATION WITH CORONARY/GRAFT ANGIOGRAM N/A 10/30/2011   Procedure: LEFT HEART CATHETERIZATION WITH EL BILE;  Surgeon: Debby JONETTA Como, MD;  Location: [facility] CATH LAB;  Service: Cardiovascular;  Laterality: N/A;   LUMBAR DISC SURGERY      removed   PERCUTANEOUS CORONARY STENT INTERVENTION (PCI-S) N/A 11/03/2011   Procedure: PERCUTANEOUS CORONARY STENT INTERVENTION (PCI-S);  Surgeon: Lonni JONETTA Cash, MD;  Location: [facility] CATH LAB;  Service: Cardiovascular;  Laterality: N/A;   TONSILLECTOMY  ~ 1955   TOTAL HIP ARTHROPLASTY Left 04/18/2016   Procedure: TOTAL HIP ARTHROPLASTY;  Surgeon: Toribio Silos, MD;  Location: [facility];  Service: Orthopedics;  Laterality: Left;   UMBILICAL HERNIA REPAIR N/A 09/13/2021   Procedure: HERNIA REPAIR UMBILICAL ADULT W/MESH;  Surgeon: Mavis Anes, MD;  Location: [facility];  Service: General;  Laterality: N/A;   URETEROLITHOTOMY Left    with stone basket removal   XI ROBOTIC ASSISTED INGUINAL HERNIA REPAIR WITH MESH Left 10/30/2022   Procedure: XI ROBOTIC ASSISTED INGUINAL HERNIA REPAIR WITH MESH;  Surgeon: Mavis Anes, MD;  Location: [facility];  Service: General;  Laterality: Left;    Home Medications:  Allergies as of 01/11/2025       Reactions   Morphine  And Codeine Nausea Only        Medication List        Accurate as of January 11, 2025 12:00 PM. If you have any questions, ask your nurse or doctor.          aspirin  EC 81 MG tablet Take 81 mg by mouth daily. Swallow whole.  atorvastatin  40 MG tablet Commonly known as: LIPITOR Take 1 tablet by mouth once daily   Dexcom G6 Transmitter Misc USE AS DIRECTED EVERY  3  MONTHS   Dexcom G7 Receiver Devi Use 1 receiver as directed with Dexcom G7 sensor to monitor glucose continuously. DX:E11.9   Dexcom G7 Sensor Misc Use 1 sensor as directed every 10 days to monitor glucose continuously. DX:E11.9   dutasteride  0.5 MG capsule Commonly known as: AVODART  Take 1 capsule by mouth once daily   famotidine  20 MG tablet Commonly known as: PEPCID  Take 20 mg by mouth 2 (two) times daily.   fluticasone 50 MCG/ACT nasal spray Commonly known as: FLONASE Place 1 spray into both nostrils daily.   hydrochlorothiazide  25 MG  tablet Commonly known as: HYDRODIURIL  Take 1 tablet (25 mg total) by mouth daily as needed (lower extermity swelling).   insulin  aspart 100 UNIT/ML FlexPen Commonly known as: NOVOLOG  Inject 10-18 Units into the skin 3 (three) times daily before meals.   Insulin  Pen Needle 29G X Misc Use to inject insulin  up to 6 times daily as directed. DX E11.65   levothyroxine  150 MCG tablet Commonly known as: SYNTHROID  Take 1 tablet (150 mcg total) by mouth daily.   lisinopril  5 MG tablet Commonly known as: ZESTRIL  Take 1 tablet (5 mg total) by mouth daily.   MAGNESIUM PO Take 1 tablet by mouth daily.   Ozempic  (1 MG/DOSE) 4 MG/3ML Sopn Generic drug: Semaglutide  (1 MG/DOSE) Inject 1 mg into the skin every Sunday.   sildenafil  100 MG tablet Commonly known as: VIAGRA  TAKE 1 TABLET BY MOUTH ONCE DAILY AS NEEDED FOR ERECTILE DYSFUNCTION   Tresiba  FlexTouch 100 UNIT/ML FlexTouch Pen Generic drug: insulin  degludec Inject 11 Units into the skin daily. Gets via [employer] patient assistance        Allergies: Allergies[1]  Family History: Family History  Problem Relation Age of Onset   Dementia Mother    Liver disease Mother    Suicidality Father    Emphysema Father    Cancer Father    Drug abuse Brother        Clean now   Rheum arthritis Sister    Heart attack Maternal Grandmother    Emphysema Maternal Grandfather    Stroke Maternal Grandfather    Stomach cancer Paternal Grandmother    Diabetes Paternal Aunt    Diabetes Paternal Aunt     Social History:  reports that he quit smoking about 56 years ago. His smoking use included cigarettes. He started smoking about 60 years ago. He has a 4 pack-year smoking history. He has never used smokeless tobacco. He reports that he does not currently use alcohol. He reports that he does not use drugs.  ROS: All other review of systems were reviewed and are negative except what is noted above in HPI  Physical Exam: BP 136/67    Pulse 64   Constitutional:  Alert and oriented, No acute distress. HEENT: [place] AT, moist mucus membranes.  Trachea midline, no masses. Cardiovascular: No clubbing, cyanosis, or edema. Respiratory: Normal respiratory effort, no increased work of breathing. GI: Abdomen is soft, nontender, nondistended, no abdominal masses GU: No CVA tenderness.  Lymph: No cervical or inguinal lymphadenopathy. Skin: No rashes, bruises or suspicious lesions. Neurologic: Grossly intact, no focal deficits, moving all 4 extremities. Psychiatric: Normal mood and affect.  Laboratory Data: Lab Results  Component Value Date   WBC 7.5 05/20/2024   HGB 14.1 05/20/2024   HCT 42.4 05/20/2024  MCV 96 05/20/2024   PLT 234 05/20/2024    Lab Results  Component Value Date   CREATININE 1.26 10/18/2024    No results found for: PSA  Lab Results  Component Value Date   TESTOSTERONE  741 02/09/2024    Lab Results  Component Value Date   HGBA1C 6.5 (H) 02/09/2024    Urinalysis    Component Value Date/Time   COLORURINE YELLOW 04/07/2016 1008   APPEARANCEUR Clear 01/08/2024 1236   LABSPEC 1.012 04/07/2016 1008   PHURINE 5.5 04/07/2016 1008   GLUCOSEU Negative 01/08/2024 1236   HGBUR NEGATIVE 04/07/2016 1008   BILIRUBINUR Negative 01/08/2024 1236   KETONESUR NEGATIVE 04/07/2016 1008   PROTEINUR Negative 01/08/2024 1236   PROTEINUR NEGATIVE 04/07/2016 1008   UROBILINOGEN 0.2 10/03/2022 1228   UROBILINOGEN 1.0 01/31/2008 0840   NITRITE Negative 01/08/2024 1236   NITRITE NEGATIVE 04/07/2016 1008   LEUKOCYTESUR Negative 01/08/2024 1236    Lab Results  Component Value Date   LABMICR 4.5 02/09/2024   WBCUA None seen 03/03/2022   LABEPIT None seen 03/03/2022   BACTERIA None seen 03/03/2022    Pertinent Imaging:  Results for orders placed during the hospital encounter of 05/27/21  DG Abd 1 View  Narrative CLINICAL DATA:  LEFT lower quadrant abdominal pain, on and off constipation and  diarrhea  EXAM: ABDOMEN - 1 VIEW  COMPARISON:  None  FINDINGS: Normal bowel gas pattern.  No bowel dilatation or bowel wall thickening.  Degenerative disc disease changes thoracolumbar spine with minimal levoconvex scoliosis.  LEFT hip prosthesis.  No urinary tract calcification.  Lung bases clear.  IMPRESSION: Normal bowel gas pattern.   Electronically Signed By: Oneil Kiss M.D. On: 05/28/2021 09:26  No results found for this or any previous visit.  No results found for this or any previous visit.  No results found for this or any previous visit.  No results found for this or any previous visit.  No results found for this or any previous visit.  No results found for this or any previous visit.  No results found for this or any previous visit.   Assessment & Plan:    1. Elevated PSA (Primary) Followup 1 year with a PSA  2. Nocturia -dutasteride  0.5mg  daily  2. Erectile dyfunction -tadalafil  20mg  prn   No follow-ups on file.  Belvie Clara, MD  [facility] [place]       [1]  Allergies Allergen Reactions   Morphine  And Codeine Nausea Only   "

## 2025-01-20 ENCOUNTER — Other Ambulatory Visit: Payer: Self-pay | Admitting: Family Medicine

## 2025-01-20 DIAGNOSIS — I700 Atherosclerosis of aorta: Secondary | ICD-10-CM

## 2025-01-20 DIAGNOSIS — I2510 Atherosclerotic heart disease of native coronary artery without angina pectoris: Secondary | ICD-10-CM

## 2025-01-20 DIAGNOSIS — E1165 Type 2 diabetes mellitus with hyperglycemia: Secondary | ICD-10-CM

## 2025-01-20 DIAGNOSIS — E1169 Type 2 diabetes mellitus with other specified complication: Secondary | ICD-10-CM
